# Patient Record
Sex: Male | Born: 1960 | Hispanic: No | Marital: Married | Smoking: Never smoker
Health system: Southern US, Community
[De-identification: ages and names within clinical notes are randomized; demographics above are authoritative.]

## PROBLEM LIST (undated history)

## (undated) DIAGNOSIS — I10 Essential (primary) hypertension: Secondary | ICD-10-CM

## (undated) DIAGNOSIS — N200 Calculus of kidney: Secondary | ICD-10-CM

## (undated) DIAGNOSIS — E785 Hyperlipidemia, unspecified: Secondary | ICD-10-CM

## (undated) DIAGNOSIS — M549 Dorsalgia, unspecified: Secondary | ICD-10-CM

## (undated) DIAGNOSIS — R413 Other amnesia: Secondary | ICD-10-CM

## (undated) HISTORY — DX: Other amnesia: R41.3

## (undated) HISTORY — DX: Dorsalgia, unspecified: M54.9

## (undated) HISTORY — PX: OTHER SURGICAL HISTORY: SHX169

## (undated) HISTORY — DX: Essential (primary) hypertension: I10

---

## 1998-09-03 ENCOUNTER — Ambulatory Visit (HOSPITAL_COMMUNITY): Admission: RE | Admit: 1998-09-03 | Discharge: 1998-09-03 | Payer: Self-pay | Admitting: Family Medicine

## 1998-09-03 ENCOUNTER — Encounter: Payer: Self-pay | Admitting: Family Medicine

## 1998-11-20 ENCOUNTER — Encounter: Admission: RE | Admit: 1998-11-20 | Discharge: 1998-11-20 | Payer: Self-pay | Admitting: Urology

## 1998-11-20 ENCOUNTER — Encounter: Payer: Self-pay | Admitting: Urology

## 1998-12-23 ENCOUNTER — Other Ambulatory Visit: Admission: RE | Admit: 1998-12-23 | Discharge: 1998-12-23 | Payer: Self-pay | Admitting: Urology

## 2006-03-13 ENCOUNTER — Emergency Department (HOSPITAL_COMMUNITY): Admission: EM | Admit: 2006-03-13 | Discharge: 2006-03-13 | Payer: Self-pay | Admitting: Emergency Medicine

## 2006-11-03 ENCOUNTER — Emergency Department (HOSPITAL_COMMUNITY): Admission: EM | Admit: 2006-11-03 | Discharge: 2006-11-03 | Payer: Self-pay | Admitting: Emergency Medicine

## 2007-06-08 ENCOUNTER — Encounter: Payer: Self-pay | Admitting: Gastroenterology

## 2007-12-05 ENCOUNTER — Encounter: Payer: Self-pay | Admitting: Gastroenterology

## 2008-01-22 ENCOUNTER — Encounter: Payer: Self-pay | Admitting: Gastroenterology

## 2008-02-09 ENCOUNTER — Ambulatory Visit: Payer: Self-pay | Admitting: Cardiology

## 2008-02-09 ENCOUNTER — Encounter: Payer: Self-pay | Admitting: Cardiology

## 2008-02-09 DIAGNOSIS — I10 Essential (primary) hypertension: Secondary | ICD-10-CM | POA: Insufficient documentation

## 2008-02-09 DIAGNOSIS — R9431 Abnormal electrocardiogram [ECG] [EKG]: Secondary | ICD-10-CM

## 2008-02-09 DIAGNOSIS — R079 Chest pain, unspecified: Secondary | ICD-10-CM | POA: Insufficient documentation

## 2008-02-09 HISTORY — DX: Abnormal electrocardiogram (ECG) (EKG): R94.31

## 2008-03-08 ENCOUNTER — Ambulatory Visit: Payer: Self-pay

## 2008-03-08 ENCOUNTER — Ambulatory Visit: Payer: Self-pay | Admitting: Cardiology

## 2008-03-08 ENCOUNTER — Encounter: Payer: Self-pay | Admitting: Cardiology

## 2009-05-28 ENCOUNTER — Encounter: Payer: Self-pay | Admitting: Gastroenterology

## 2009-05-30 ENCOUNTER — Encounter (INDEPENDENT_AMBULATORY_CARE_PROVIDER_SITE_OTHER): Payer: Self-pay | Admitting: *Deleted

## 2009-07-02 ENCOUNTER — Ambulatory Visit: Payer: Self-pay | Admitting: Gastroenterology

## 2009-07-02 DIAGNOSIS — R195 Other fecal abnormalities: Secondary | ICD-10-CM | POA: Insufficient documentation

## 2009-07-15 ENCOUNTER — Ambulatory Visit: Payer: Self-pay | Admitting: Gastroenterology

## 2009-07-17 ENCOUNTER — Encounter: Payer: Self-pay | Admitting: Gastroenterology

## 2010-02-17 NOTE — Letter (Signed)
Summary: [facility] Instructions  [facility]  [address] [place], [place] [address]   Phone: [phone]  Fax: [phone]       Joseph Tyler    1960/03/31    MRN: [id number]        Procedure Day /Date:TUESDAY 07/15/2009     Arrival Time:9:30AM     Procedure Time:10:30AM     Location of Procedure:                    X   [facility] (4th Floor)                        PREPARATION FOR COLONOSCOPY WITH MOVIPREP   Starting 5 days prior to your procedure 07/10/2009 do not eat nuts, seeds, popcorn, corn, beans, peas,  salads, or any raw vegetables.  Do not take any fiber supplements (e.g. Metamucil, Citrucel, and Benefiber).  THE DAY BEFORE YOUR PROCEDURE         DATE: 07/14/2009  DAY: MONDAY  1.  Drink clear liquids the entire day-NO SOLID FOOD  2.  Do not drink anything colored red or purple.  Avoid juices with pulp.  No orange juice.  3.  Drink at least 64 oz. (8 glasses) of fluid/clear liquids during the day to prevent dehydration and help the prep work efficiently.  CLEAR LIQUIDS INCLUDE: Water Jello Ice Popsicles Tea (sugar ok, no milk/cream) Powdered fruit flavored drinks Coffee (sugar ok, no milk/cream) Gatorade Juice: apple, white grape, white cranberry  Lemonade Clear bullion, consomm, broth Carbonated beverages (any kind) Strained chicken noodle soup Hard Candy                             4.  In the morning, mix first dose of MoviPrep solution:    Empty 1 Pouch A and 1 Pouch B into the disposable container    Add lukewarm drinking water to the top line of the container. Mix to dissolve    Refrigerate (mixed solution should be used within 24 hrs)  5.  Begin drinking the prep at 5:00 p.m. The MoviPrep container is divided by 4 marks.   Every 15 minutes drink the solution down to the next mark (approximately 8 oz) until the full liter is complete.   6.  Follow completed prep with 16 oz of clear liquid of your choice  (Nothing red or purple).  Continue to drink clear liquids until bedtime.  7.  Before going to bed, mix second dose of MoviPrep solution:    Empty 1 Pouch A and 1 Pouch B into the disposable container    Add lukewarm drinking water to the top line of the container. Mix to dissolve    Refrigerate  THE DAY OF YOUR PROCEDURE      DATE: 07/15/2009 DAY: TUESDAY  Beginning at 5:30a.m. (5 hours before procedure):         1. Every 15 minutes, drink the solution down to the next mark (approx 8 oz) until the full liter is complete.  2. Follow completed prep with 16 oz. of clear liquid of your choice.    3. You may drink clear liquids until8:30AM (2 HOURS BEFORE PROCEDURE).   MEDICATION INSTRUCTIONS  Unless otherwise instructed, you should take regular prescription medications with a small sip of water   as early as possible the morning of your procedure.  OTHER INSTRUCTIONS  You will need a responsible adult at least 50 years of age to accompany you and drive you home.   This person must remain in the waiting room during your procedure.  Wear loose fitting clothing that is easily removed.  Leave jewelry and other valuables at home.  However, you may wish to bring a book to read or  an iPod/MP3 player to listen to music as you wait for your procedure to start.  Remove all body piercing jewelry and leave at home.  Total time from sign-in until discharge is approximately 2-3 hours.  You should go home directly after your procedure and rest.  You can resume normal activities the  day after your procedure.  The day of your procedure you should not:   Drive   Make legal decisions   Operate machinery   Drink alcohol   Return to work  You will receive specific instructions about eating, activities and medications before you leave.    The above instructions have been reviewed and explained to me by   _______________________    I fully understand and can  verbalize these instructions _____________________________ Date _________

## 2010-02-17 NOTE — Letter (Signed)
Summary: New Patient letter  [facility]  [address] [place], [place] [address]   Phone: [phone]  Fax: [phone]       05/30/2009 MRN: [id number]  [facility] [address] [place], [place]  [address]  Dear Joseph Tyler,  Welcome to the Gastroenterology Division at [facility].    You are scheduled to see Dr.  Arlyce Dice on 07-02-09 at 10am on the 3rd floor at [facility], [address]  We ask that you try to arrive at our office 15 minutes prior to your appointment time to allow for check-in.  We would like you to complete the enclosed self-administered evaluation form prior to your visit and bring it with you on the day of your appointment.  We will review it with you.  Also, please bring a complete list of all your medications or, if you prefer, bring the medication bottles and we will list them.  Please bring your insurance card so that we may make a copy of it.  If your insurance requires a referral to see a specialist, please bring your referral form from your primary care physician.  Co-payments are due at the time of your visit and may be paid by cash, check or credit card.     Your office visit will consist of a consult with your physician (includes a physical exam), any laboratory testing he/she may order, scheduling of any necessary diagnostic testing (e.g. x-ray, ultrasound, CT-scan), and scheduling of a procedure (e.g. Endoscopy, Colonoscopy) if required.  Please allow enough time on your schedule to allow for any/all of these possibilities.    If you cannot keep your appointment, please call [phone] to cancel or reschedule prior to your appointment date.  This allows [place] the opportunity to schedule an appointment for another patient in need of care.  If you do not cancel or reschedule by 5 p.m. the business day prior to your appointment date, you will be charged a $50.00 late cancellation/no-show fee.    Thank you for choosing  [facility] for your medical needs.  We appreciate the opportunity to care for you.  Please visit [place] at our website  to learn more about our practice.                     Sincerely,                                                             The Gastroenterology Division

## 2010-02-17 NOTE — Letter (Signed)
Summary: Results Letter  [facility]  [address] [place], [place] [address]   Phone: [phone]  Fax: [phone]        July 17, 2009 MRN: [id number]    [facility] [address] [place], [place]  [address]    Dear Mr. LAMINACK,  Your biopsy results did not show any remarkable findings. I recommend that you undergo followup surveillance colonloscopy in 10 years. Please continue with the recommendations previously discussed.  Should you have any further questions or immediate concers, feel free to contact me.  Sincerely,  Barbette Hair. Arlyce Dice, M.D., [facility]          Sincerely,  Louis Meckel MD  This letter has been electronically signed by your physician.  Appended Document: Results Letter letter mailed 7.6.11

## 2010-02-17 NOTE — Letter (Signed)
Summary: Results Letter  [facility]  [address] [place], [place] [address]   Phone: [phone]  Fax: [phone]        July 02, 2009 MRN: [id number]    [facility] [address] [place], [place]  [address]    Dear Mr. BOUILLON,  It is my pleasure to have treated you recently as a new patient in my office. I appreciate your confidence and the opportunity to participate in your care.  Since I do have a busy inpatient endoscopy schedule and office schedule, my office hours vary weekly. I am, however, available for emergency calls everyday through my office. If I am not available for an urgent office appointment, another one of our gastroenterologist will be able to assist you.  My well-trained staff are prepared to help you at all times. For emergencies after office hours, a physician from our Gastroenterology section is always available through my 24 hour answering service  Once again I welcome you as a new patient and I look forward to a happy and healthy relationship             Sincerely,  Louis Meckel MD  This letter has been electronically signed by your physician.  Appended Document: Results Letter letter mailed

## 2010-02-17 NOTE — Procedures (Signed)
Summary: Colonoscopy  Patient: Joseph Tyler Note: All result statuses are Final unless otherwise noted.  Tests: (1) Colonoscopy (COL)   COL Colonoscopy           DONE     [facility]     [address]     [place], [place]  [address]           COLONOSCOPY PROCEDURE REPORT           PATIENT:  Joseph Tyler, Joseph Tyler  MR#:  [id number]     BIRTHDATE:  23-Dec-1960, 49 yrs. old  GENDER:  male           ENDOSCOPIST:  Barbette Hair. Arlyce Dice, MD     Referred by:           PROCEDURE DATE:  07/15/2009     PROCEDURE:  Colonoscopy with snare polypectomy     ASA CLASS:  Class II     INDICATIONS:  1) heme positive stool           MEDICATIONS:   Fentanyl 50 mcg IV, Versed 4 mg IV           DESCRIPTION OF PROCEDURE:   After the risks benefits and     alternatives of the procedure were thoroughly explained, informed     consent was obtained.  Digital rectal exam was performed and     revealed no abnormalities.   The [id number] [id number] endoscope was     introduced through the anus and advanced to the cecum, which was     identified by both the appendix and ileocecal valve, without     limitations.  The quality of the prep was excellent, using     MoviPrep.  The instrument was then slowly withdrawn as the colon     was fully examined.     <<PROCEDUREIMAGES>>           FINDINGS:  A diminutive polyp was found in the rectum. It was 2 mm     in size. It was found 2 cm from the point of entry. Nonbleeding     sessile polyp Polyp was snared without cautery. Retrieval was     successful (see image21). snare polyp  This was otherwise a normal     examination of the colon (see image2, image3, image4, image7,     image8, image9, image12, image15, image17, image19, and image20).     Retroflexed views in the rectum revealed no abnormalities.    The     time to cecum =  2.0  minutes. The scope was then withdrawn (time     =  11.25  min) from the patient and the procedure completed.        COMPLICATIONS:  None           ENDOSCOPIC IMPRESSION:     1) 2 mm diminutive polyp in the rectum     2) Otherwise normal examination           No definitive source for heme positive stool identified           RECOMMENDATIONS:     1) If the polyp(s) removed today are proven to be adenomatous     (pre-cancerous) polyps, you will need a repeat colonoscopy in 5     years. Otherwise you should continue to follow colorectal cancer     screening guidelines for "routine risk" patients with colonoscopy     in 10 years.     2) followup  hemeoccults in 1 week           REPEAT EXAM:   You will receive a letter from Dr. Arlyce Dice in 1-2     weeks, after reviewing the final pathology, with followup     recommendations.           ______________________________     Barbette Hair Arlyce Dice, MD           CC: Lewie Deman Bellow, MD           n.     Rosalie Doctor:   Barbette Hair. Gavyn Ybarra at 07/15/2009 11:16 AM           Page 2 of 3   [name], [name], [phone]  Note: An exclamation mark (!) indicates a result that was not dispersed into the flowsheet. Document Creation Date: 07/15/2009 11:17 AM _______________________________________________________________________  (1) Order result status: Final Collection or observation date-time: 07/15/2009 11:10 Requested date-time:  Receipt date-time:  Reported date-time:  Referring Physician:   Ordering Physician: Melvia Heaps [id number]) Specimen Source:  Source: Launa Grill Order Number: [id number] Lab site:   Appended Document: Colonoscopy     Procedures Next Due Date:    Colonoscopy: 07/2019

## 2010-02-17 NOTE — Assessment & Plan Note (Signed)
Summary: BLOOD IN STOOLS & FATIGUE/YF   History of Present Illness Visit Type: Initial Consult Primary GI MD: Melvia Heaps MD [facility] Primary Provider: Mila Homer, MD Requesting Provider: Mila Homer, MD Chief Complaint: Patient was referred for hem pos stools but he denies any blood in his stool. He also denies any GI complaints. Only states that he has alot of fatigue.  History of Present Illness:   Mr. Alvira is a 50 year old male referred at the request of Dr. Perrin Maltese for Hemoccult-positive stools.  He has no GI complaints including change in bowel habits, abdominal pain, melena or hematochezia.  He is on no gastric irritants.  Laboratory is  pertinent for a hemoglobin of 12.8 with a normal MCV.  In November, 2009 hemoglobin was 13.9.   GI Review of Systems      Denies abdominal pain, acid reflux, belching, bloating, chest pain, dysphagia with liquids, dysphagia with solids, heartburn, loss of appetite, nausea, vomiting, vomiting blood, weight loss, and  weight gain.      Reports heme positive stool.     Denies anal fissure, black tarry stools, change in bowel habit, constipation, diarrhea, diverticulosis, fecal incontinence, hemorrhoids, irritable bowel syndrome, jaundice, light color stool, liver problems, rectal bleeding, and  rectal pain.    Current Medications (verified): 1)  Lisinopril 20 Mg Tabs (Lisinopril) .... One By Mouth Daily 2)  Vitamin D 2000 Unit Tabs (Cholecalciferol) .... Take Two By Mouth Once Daily 3)  Multivitamins  Tabs (Multiple Vitamin) .... Take One By Mouth Once Daily  Allergies (verified): No Known Drug Allergies  Past History:  Past Medical History: Reviewed history from 02/09/2008 and no changes required. HTN (off and on since 2002)  Past Surgical History: Reviewed history from 02/09/2008 and no changes required. None  Social History: The patient has never smoked. He doesn't drink alcohol. He is married. He has 4 young children.  He works for a [employer]. He has 2 jobs. Daily Caffeine Use 4 per day  Review of Systems       The patient complains of back pain, blood in urine, change in vision, headaches-new, and muscle pains/cramps.  The patient denies allergy/sinus, anemia, anxiety-new, arthritis/joint pain, breast changes/lumps, confusion, cough, coughing up blood, depression-new, fainting, fatigue, fever, hearing problems, heart murmur, heart rhythm changes, itching, menstrual pain, night sweats, nosebleeds, pregnancy symptoms, shortness of breath, skin rash, sleeping problems, sore throat, swelling of feet/legs, swollen lymph glands, thirst - excessive , urination - excessive , urination changes/pain, urine leakage, vision changes, and voice change.         All other systems were reviewed and were negative   Vital Signs:  Patient profile:   50 year old male Height:      67 inches Weight:      149.4 pounds BMI:     23.48 Pulse rate:   60 / minute Pulse rhythm:   132regular BP sitting:   132 / 76  (right arm) Cuff size:   regular  Vitals Entered By: Harlow Mares CMA (AAMA) (July 02, 2009 10:14 AM)  Physical Exam  Additional Exam:  And physical exam he is a healthy-appearing male  skin: anicteric HEENT: normocephalic; PEERLA; no nasal or pharyngeal abnormalities neck: supple nodes: no cervical lymphadenopathy chest: clear to ausculatation and percussion heart: no murmurs, gallops, or rubs abd: soft, nontender; BS normoactive; no abdominal masses, tenderness, organomegaly rectal: deferred ext: no cynanosis, clubbing, edema skeletal: no deformities neuro: oriented x 3; no focal abnormalities  Impression & Recommendations:  Problem # 1:  NONSPECIFIC ABNORMAL FINDING IN STOOL CONTENTS (ICD-792.1) Plan colonoscopy.  If negative I will obtain followup Hemoccults.  Risks, alternatives, and complications of the procedure, including bleeding, perforation, and possible need for surgery,  were explained to the patient.  Patient's questions were answered.  Orders: Colonoscopy (Colon)  Patient Instructions: 1)  Copy sent to : Mila Homer, MD 2)  Your colonoscopy is scheduled for 07/15/2009 at 10:30 am on the 4th floor 3)  Your MoviPrep is being sent to your pharmacy today 4)  The medication list was reviewed and reconciled.  All changed / newly prescribed medications were explained.  A complete medication list was provided to the patient / caregiver. 5)  Colonoscopy and Flexible Sigmoidoscopy brochure given.  6)  Conscious Sedation brochure given.  Prescriptions: MOVIPREP 100 GM  SOLR (PEG-KCL-NACL-NASULF-NA ASC-C) As per prep instructions.  #1 x 0   Entered by:   Merri Ray CMA (AAMA)   Authorized by:   Louis Meckel MD   Signed by:   Merri Ray CMA (AAMA) on 07/02/2009   Method used:   Electronically to        [id number] (retail)       [address]       [place], [place]  [address]       Ph: [phone] or [phone]       Fax: [phone]   RxID:   [id number]

## 2010-04-24 ENCOUNTER — Emergency Department (HOSPITAL_COMMUNITY)
Admission: EM | Admit: 2010-04-24 | Discharge: 2010-04-24 | Disposition: A | Discharge status: Home / Self Care | Payer: No Typology Code available for payment source | Attending: Emergency Medicine | Admitting: Emergency Medicine

## 2010-04-24 ENCOUNTER — Emergency Department (HOSPITAL_COMMUNITY): Payer: No Typology Code available for payment source

## 2010-04-24 DIAGNOSIS — S139XXA Sprain of joints and ligaments of unspecified parts of neck, initial encounter: Secondary | ICD-10-CM | POA: Insufficient documentation

## 2010-04-24 DIAGNOSIS — Y92410 Unspecified street and highway as the place of occurrence of the external cause: Secondary | ICD-10-CM | POA: Insufficient documentation

## 2010-04-24 DIAGNOSIS — S8000XA Contusion of unspecified knee, initial encounter: Secondary | ICD-10-CM | POA: Insufficient documentation

## 2010-04-24 DIAGNOSIS — I10 Essential (primary) hypertension: Secondary | ICD-10-CM | POA: Insufficient documentation

## 2010-04-24 DIAGNOSIS — S335XXA Sprain of ligaments of lumbar spine, initial encounter: Secondary | ICD-10-CM | POA: Insufficient documentation

## 2010-07-13 ENCOUNTER — Encounter: Payer: Self-pay | Admitting: Cardiology

## 2010-08-12 ENCOUNTER — Other Ambulatory Visit: Payer: Self-pay | Admitting: Specialist

## 2010-08-12 DIAGNOSIS — M545 Low back pain, unspecified: Secondary | ICD-10-CM

## 2010-08-12 DIAGNOSIS — M542 Cervicalgia: Secondary | ICD-10-CM

## 2010-08-17 ENCOUNTER — Other Ambulatory Visit: Payer: No Typology Code available for payment source

## 2010-08-26 ENCOUNTER — Ambulatory Visit
Admission: RE | Admit: 2010-08-26 | Discharge: 2010-08-26 | Disposition: A | Discharge status: Home / Self Care | Payer: Managed Care, Other (non HMO) | Source: Ambulatory Visit | Attending: Specialist | Admitting: Specialist

## 2010-08-26 DIAGNOSIS — M542 Cervicalgia: Secondary | ICD-10-CM

## 2010-08-26 DIAGNOSIS — M545 Low back pain, unspecified: Secondary | ICD-10-CM

## 2010-10-28 LAB — CBC
Hemoglobin: 13
MCHC: 34.2
RBC: 4.64
WBC: 8.7

## 2010-10-28 LAB — DIFFERENTIAL
Basophils Relative: 0
Eosinophils Absolute: 0.2
Eosinophils Relative: 2
Lymphs Abs: 1.7
Monocytes Relative: 7
Neutrophils Relative %: 70

## 2010-10-28 LAB — COMPREHENSIVE METABOLIC PANEL
ALT: 15
AST: 18
Alkaline Phosphatase: 100
CO2: 23
Calcium: 8.8
GFR calc Af Amer: >60
GFR calc non Af Amer: >60
Glucose, Bld: 191 — ABNORMAL HIGH
Potassium: 3.2 — ABNORMAL LOW
Sodium: 140

## 2010-10-28 LAB — URINALYSIS, ROUTINE W REFLEX MICROSCOPIC
Bilirubin Urine: NEGATIVE
Glucose, UA: NEGATIVE
Ketones, ur: 15 — AB
Protein, ur: 30 — AB
pH: 6.5

## 2010-10-28 LAB — LIPASE, BLOOD: Lipase: 17

## 2010-12-28 ENCOUNTER — Ambulatory Visit: Payer: Self-pay | Attending: Specialist | Admitting: Physical Therapy

## 2010-12-28 DIAGNOSIS — M2569 Stiffness of other specified joint, not elsewhere classified: Secondary | ICD-10-CM | POA: Insufficient documentation

## 2010-12-28 DIAGNOSIS — M545 Low back pain, unspecified: Secondary | ICD-10-CM | POA: Insufficient documentation

## 2010-12-28 DIAGNOSIS — IMO0001 Reserved for inherently not codable concepts without codable children: Secondary | ICD-10-CM | POA: Insufficient documentation

## 2010-12-28 DIAGNOSIS — M25569 Pain in unspecified knee: Secondary | ICD-10-CM | POA: Insufficient documentation

## 2011-01-01 ENCOUNTER — Ambulatory Visit: Payer: Self-pay

## 2011-01-08 ENCOUNTER — Ambulatory Visit: Payer: Self-pay | Admitting: Physical Therapy

## 2011-01-13 ENCOUNTER — Ambulatory Visit: Payer: Self-pay

## 2011-01-27 ENCOUNTER — Ambulatory Visit: Payer: Managed Care, Other (non HMO) | Attending: Specialist | Admitting: Physical Therapy

## 2011-01-27 DIAGNOSIS — M2569 Stiffness of other specified joint, not elsewhere classified: Secondary | ICD-10-CM | POA: Insufficient documentation

## 2011-01-27 DIAGNOSIS — M545 Low back pain, unspecified: Secondary | ICD-10-CM | POA: Insufficient documentation

## 2011-01-27 DIAGNOSIS — IMO0001 Reserved for inherently not codable concepts without codable children: Secondary | ICD-10-CM | POA: Insufficient documentation

## 2011-01-27 DIAGNOSIS — M25569 Pain in unspecified knee: Secondary | ICD-10-CM | POA: Insufficient documentation

## 2011-02-02 ENCOUNTER — Encounter: Payer: Managed Care, Other (non HMO) | Admitting: Physical Therapy

## 2011-02-04 ENCOUNTER — Ambulatory Visit: Payer: Managed Care, Other (non HMO) | Admitting: Physical Therapy

## 2011-02-09 ENCOUNTER — Encounter: Payer: Managed Care, Other (non HMO) | Admitting: Physical Therapy

## 2011-02-11 ENCOUNTER — Encounter: Payer: Managed Care, Other (non HMO) | Admitting: Physical Therapy

## 2011-02-17 ENCOUNTER — Ambulatory Visit: Payer: Managed Care, Other (non HMO) | Admitting: Physical Therapy

## 2011-02-23 ENCOUNTER — Ambulatory Visit: Payer: Self-pay | Attending: Specialist | Admitting: Physical Therapy

## 2011-02-23 DIAGNOSIS — IMO0001 Reserved for inherently not codable concepts without codable children: Secondary | ICD-10-CM | POA: Insufficient documentation

## 2011-02-23 DIAGNOSIS — M545 Low back pain, unspecified: Secondary | ICD-10-CM | POA: Insufficient documentation

## 2011-02-23 DIAGNOSIS — M25569 Pain in unspecified knee: Secondary | ICD-10-CM | POA: Insufficient documentation

## 2011-02-23 DIAGNOSIS — M2569 Stiffness of other specified joint, not elsewhere classified: Secondary | ICD-10-CM | POA: Insufficient documentation

## 2011-02-24 ENCOUNTER — Ambulatory Visit: Payer: Self-pay

## 2011-03-01 ENCOUNTER — Encounter: Payer: Managed Care, Other (non HMO) | Admitting: Physical Therapy

## 2011-03-05 ENCOUNTER — Ambulatory Visit: Payer: Self-pay | Admitting: Physical Therapy

## 2011-03-08 ENCOUNTER — Ambulatory Visit: Payer: Self-pay

## 2011-03-10 ENCOUNTER — Ambulatory Visit: Payer: Self-pay

## 2011-03-25 ENCOUNTER — Other Ambulatory Visit: Payer: Self-pay

## 2011-03-25 NOTE — Telephone Encounter (Signed)
Pt is screaming regarding his medication refills.  He is out of his blood pressure medicines and wants them today.

## 2011-03-26 ENCOUNTER — Other Ambulatory Visit: Payer: Self-pay | Admitting: Family Medicine

## 2011-03-26 MED ORDER — LISINOPRIL 10 MG PO TABS: 10.0000 mg | ORAL_TABLET | Freq: Every day | ORAL | Status: DC

## 2011-03-26 NOTE — Telephone Encounter (Signed)
Spoke with patient and notified 15 day rx sent in today.  He will recheck next Tuesday with Dr. Perrin Maltese.

## 2011-04-09 ENCOUNTER — Ambulatory Visit (INDEPENDENT_AMBULATORY_CARE_PROVIDER_SITE_OTHER): Payer: Managed Care, Other (non HMO) | Admitting: Family Medicine

## 2011-04-09 VITALS — BP 135/89 | HR 70 | Temp 98.5°F | Resp 16 | Ht 67.0 in | Wt 149.0 lb

## 2011-04-09 DIAGNOSIS — K589 Irritable bowel syndrome without diarrhea: Secondary | ICD-10-CM

## 2011-04-09 DIAGNOSIS — I10 Essential (primary) hypertension: Secondary | ICD-10-CM

## 2011-04-09 MED ORDER — ESOMEPRAZOLE MAGNESIUM 40 MG PO CPDR: 40.0000 mg | DELAYED_RELEASE_CAPSULE | Freq: Every day | ORAL | Status: AC

## 2011-04-09 MED ORDER — LISINOPRIL 10 MG PO TABS: 10.0000 mg | ORAL_TABLET | Freq: Every day | ORAL | Status: DC

## 2011-04-09 NOTE — Patient Instructions (Signed)

## 2011-04-09 NOTE — Progress Notes (Signed)
51 yo [place] man with a recurrence of abdominal pains which began yesterday.  Has responded to Nexium in the past.  The discomfort is crampy and assoc with some lower chest pressure.  Also patient needs refill on his lisinopril  O:  HEENT:  Unremarkable except small bleeding area upper left tooth #10 Neck: supple no adenoparhty Chest:  Clear Heart: reg, no murmur or gallop, loud S2 Abdomen:  Mildly tender diffusely with deep palpation, no masses, no rebound or guarding, no HSM Ext: good pulses, FROM, no edema Skin warm and dry.  Assessment:  Stable BP Recurrence of IBS  P:  Nexium x 7 days Refill Lisinopril 10 mg qd

## 2011-04-29 ENCOUNTER — Encounter: Payer: Self-pay | Admitting: Family Medicine

## 2011-04-29 ENCOUNTER — Ambulatory Visit (INDEPENDENT_AMBULATORY_CARE_PROVIDER_SITE_OTHER): Payer: Managed Care, Other (non HMO) | Admitting: Internal Medicine

## 2011-04-29 VITALS — BP 130/86 | HR 63 | Temp 98.2°F | Resp 16 | Ht 67.0 in | Wt 142.0 lb

## 2011-04-29 DIAGNOSIS — K120 Recurrent oral aphthae: Secondary | ICD-10-CM

## 2011-04-29 DIAGNOSIS — K047 Periapical abscess without sinus: Secondary | ICD-10-CM

## 2011-04-29 DIAGNOSIS — L98499 Non-pressure chronic ulcer of skin of other sites with unspecified severity: Secondary | ICD-10-CM

## 2011-04-29 DIAGNOSIS — K122 Cellulitis and abscess of mouth: Secondary | ICD-10-CM

## 2011-04-29 MED ORDER — AMOXICILLIN 500 MG PO CAPS: 1000.0000 mg | ORAL_CAPSULE | Freq: Two times a day (BID) | ORAL | Status: AC

## 2011-04-29 NOTE — Patient Instructions (Signed)
Trench Mouth Trench mouth is a sudden, painful sore (ulceration) of the gums that is caused by bacteria. Trench mouth is a painful form of gingivitis. Gingivitis means redness and soreness of the gums. The term "trench mouth" comes from World War I. The condition was common when the disorder appeared among soldiers in the trenches. The mouth normally contains a balance of all the germs growing in it. When the balance is upset and too many germs start growing, it can cause painful ulcers on the gums. It is an uncommon disorder which usually affects young adults. CAUSES   Poor nutrition.   Poor oral hygiene.   Smoking.   Emotional distress.  SYMPTOMS   Painful gums which are red, swollen, and covered with a grayish film. There may be destruction of tissue between the teeth due to the ulcerations.   Gum bleeding with even minor brushing or irritation.   Ulcerations of the gums along with a bad taste in the mouth and bad breath.   Fever and swollen glands in the neck.  DIAGNOSIS  Your caregiver can usually make this diagnosis by a physical exam. Sometimes, X-rays and cultures may be used to help with the diagnosis. TREATMENT  Treatment is aimed at relief of symptoms and getting rid of the infection. Antibiotic medicine may be prescribed. HOME CARE INSTRUCTIONS  Good oral hygiene is necessary. Thorough toothbrushing and flossing must be performed often. This should be done after meals and at bedtime. Your caregiver may be able to help by suggesting a soothing rinse or the use of a local pain medicine, such as viscous lidocaine, before brushing.   Warm salt water rinses made up of 1 tsp of salt in 2 cups of warm water may be helpful. If the rinse is painful, add more water.   Only take over-the-counter or prescription medicines for pain, discomfort, or fever as directed by your caregiver.   Take your antibiotics as directed. Finish them even if you start to feel better.   Follow your  dentist's instructions for teeth cleaning during this infection. Follow up with all your caregivers as directed.   Avoid smoking, alcohol, and irritating foods. You will know immediately which foods irritate your mouth. Typically, these will be spicy, sour, or citrus foods.  SEEK IMMEDIATE MEDICAL CARE IF:   You develop pain or swelling in your face.   You have a fever.   You are unable to take fluids or eat food comfortably.   Your medicines do not seem to be working and you feel you are getting worse.   Your medicines are not relieving your pain.   You develop a stiff neck or a severe headache, which is unrelieved with medicines.  MAKE SURE YOU:  Understand these instructions.   Will watch your condition.   Will get help right away if you are not doing well or get worse.  Document Released: 04/09/2004 Document Revised: 12/24/2010 Document Reviewed: 08/13/2010 [facility] Patient Information 2012 [place], [place].

## 2011-04-29 NOTE — Progress Notes (Signed)
  Subjective:    Patient ID: Joseph Tyler, male    DOB: 08/30/1960, 51 y.o.   MRN: [id number]  HPI Has gum and tongue pain, seeing dentist for infected tooth. Has new dental abcess . Review of Systems No tobacco use   Objective:   Physical Exam Tender tooth. Ulcer tongue right side small      Assessment & Plan:  Amoxil 500mg  tid Dukes magic mouthwash

## 2011-05-24 ENCOUNTER — Telehealth: Payer: Self-pay

## 2011-05-24 DIAGNOSIS — K121 Other forms of stomatitis: Secondary | ICD-10-CM

## 2011-05-24 NOTE — Telephone Encounter (Signed)
Ok for referral to ENT.  Please refer to ENT of patient's choice.

## 2011-05-24 NOTE — Telephone Encounter (Signed)
Can we refer for mouth ulcer?

## 2011-05-24 NOTE — Telephone Encounter (Signed)
Patient would like Dr. Perrin Maltese to refer him to ENT. Is still having problems that he was already seen for.

## 2011-05-24 NOTE — Telephone Encounter (Signed)
Pt does not have any particular ENT he wants to see - just whoever can see him soon. He may RTC if it takes too long to get in to see specialist. Told pt we will be happy to see him for f/up

## 2011-06-08 ENCOUNTER — Ambulatory Visit (INDEPENDENT_AMBULATORY_CARE_PROVIDER_SITE_OTHER): Payer: Managed Care, Other (non HMO) | Admitting: Internal Medicine

## 2011-06-08 VITALS — BP 109/71 | HR 65 | Temp 97.9°F | Resp 16 | Ht 68.0 in | Wt 142.6 lb

## 2011-06-08 DIAGNOSIS — R634 Abnormal weight loss: Secondary | ICD-10-CM

## 2011-06-08 DIAGNOSIS — Z7189 Other specified counseling: Secondary | ICD-10-CM

## 2011-06-08 DIAGNOSIS — K146 Glossodynia: Secondary | ICD-10-CM | POA: Insufficient documentation

## 2011-06-08 DIAGNOSIS — I10 Essential (primary) hypertension: Secondary | ICD-10-CM

## 2011-06-08 DIAGNOSIS — R5383 Other fatigue: Secondary | ICD-10-CM

## 2011-06-08 LAB — POCT CBC
Lymph, poc: 1.8 (ref 0.6–3.4)
MCH, POC: 27.3 pg (ref 27–31.2)
MCHC: 32.3 g/dL (ref 31.8–35.4)
MID (cbc): 0.7 (ref 0–0.9)
MPV: 8.6 fL (ref 0–99.8)
POC Granulocyte: 4.4 (ref 2–6.9)
POC MID %: 10 %M (ref 0–12)
Platelet Count, POC: 246 10*3/uL (ref 142–424)
RDW, POC: 15.3 %
WBC: 6.9 10*3/uL (ref 4.6–10.2)

## 2011-06-08 MED ORDER — LISINOPRIL 10 MG PO TABS: 10.0000 mg | ORAL_TABLET | Freq: Every day | ORAL | Status: DC

## 2011-06-08 MED ORDER — HYDROCODONE-ACETAMINOPHEN 7.5-500 MG/15ML PO SOLN: 5.0000 mL | Freq: Four times a day (QID) | ORAL | Status: AC | PRN

## 2011-06-08 NOTE — Progress Notes (Signed)
  Subjective:    Patient ID: Joseph Tyler, male    DOB: [birth year], 51 y.o.   MRN: [id number]  HPI Has progressive painful tongue over 2 months. Antibiotics and gargles no help. Scheduled to see ENT Dr. Emeline Darling Needs HTN f/up Has fatigue from painful tongue, talking   Review of Systems     Objective:   Physical Exam Tongue R posterior red, angry, ulcerated BP controlled Heart ok  labs     Assessment & Plan:  Lortab elixir for pain RF lisinopril

## 2011-06-09 ENCOUNTER — Other Ambulatory Visit: Payer: Self-pay | Admitting: Otolaryngology

## 2011-06-09 LAB — COMPREHENSIVE METABOLIC PANEL
ALT: <8 U/L (ref 0–53)
AST: 10 U/L (ref 0–37)
Alkaline Phosphatase: 93 U/L (ref 39–117)
Creat: 0.97 mg/dL (ref 0.50–1.35)
Sodium: 137 mEq/L (ref 135–145)
Total Bilirubin: 0.5 mg/dL (ref 0.3–1.2)

## 2011-06-09 LAB — TSH: TSH: 1.357 u[IU]/mL (ref 0.350–4.500)

## 2011-06-11 ENCOUNTER — Encounter: Payer: Self-pay | Admitting: Family Medicine

## 2011-08-02 ENCOUNTER — Ambulatory Visit: Payer: Managed Care, Other (non HMO) | Admitting: Internal Medicine

## 2011-08-16 ENCOUNTER — Ambulatory Visit (INDEPENDENT_AMBULATORY_CARE_PROVIDER_SITE_OTHER): Payer: Managed Care, Other (non HMO) | Admitting: Internal Medicine

## 2011-08-16 ENCOUNTER — Encounter: Payer: Self-pay | Admitting: Internal Medicine

## 2011-08-16 VITALS — BP 116/80 | HR 58 | Temp 98.0°F | Resp 16 | Ht 67.5 in | Wt 140.2 lb

## 2011-08-16 DIAGNOSIS — R5381 Other malaise: Secondary | ICD-10-CM

## 2011-08-16 DIAGNOSIS — Z79899 Other long term (current) drug therapy: Secondary | ICD-10-CM

## 2011-08-16 DIAGNOSIS — N529 Male erectile dysfunction, unspecified: Secondary | ICD-10-CM

## 2011-08-16 DIAGNOSIS — I10 Essential (primary) hypertension: Secondary | ICD-10-CM

## 2011-08-16 DIAGNOSIS — R5383 Other fatigue: Secondary | ICD-10-CM

## 2011-08-16 LAB — BASIC METABOLIC PANEL
BUN: 10 mg/dL (ref 6–23)
CO2: 24 mEq/L (ref 19–32)
Chloride: 108 mEq/L (ref 96–112)
Potassium: 4.3 mEq/L (ref 3.5–5.3)

## 2011-08-16 NOTE — Patient Instructions (Signed)
Erectile Dysfunction Erectile dysfunction (ED) is the inability to get a good enough erection to have sexual intercourse. ED may involve:  Inability to get an erection.   Lack of enough hardness to allow penetration.   Loss of the erection before sex is finished.   Premature ejaculation.   Any combination of these problems if they occur more than 25% of the time.  CAUSES  Certain drugs, such as:   Pain relievers.   Antihistamines.   Antidepressants.   Blood pressure medicines.   Water pills.   Ulcer medicines.   Muscle relaxants.   Illegal drugs.   Excessive drinking.   Psychological causes, such as:   Anxiety.   Depression.   Sadness.   Exhaustion.   Performance fear.   Stress.   Physical causes, such as:   Artery problems. This may include diabetes, smoking, liver disease, or atherosclerosis.   High blood pressure.   Hormonal problems, such as low testosterone.   Obesity.   Nerve problems. This may include back or pelvic injuries, diabetes, multiple sclerosis, Parkinson's disease, or some surgeries.  SYMPTOMS  Inability to get an erection.   Lack of enough hardness to allow penetration.   Loss of the erection before sex is finished.   Premature ejaculation.   Normal erections at some times, but with frequent unsatisfactory episodes.   Orgasms that are not satisfactory in sensation or frequency.   Low sexual satisfaction in either partner because of erection problems.   A curved penis occurring with erection. The curve may cause pain or may be too curved to allow for intercourse.   Never having nighttime erections.  DIAGNOSIS Your caregiver can often diagnose this condition by:  Performing a physical exam to find other diseases or specific problems with the penis.   Asking you detailed questions about the problem.   Performing blood tests to check for diabetes or to measure hormone levels.   Performing urine tests to find other  underlying health conditions.   Performing an ultrasound to check for scarring.   Performing a test to check blood flow to the penis.   Doing a sleep study at home to measure nighttime erections.  TREATMENT   You may be prescribed medicines by mouth.   You may be given medicine injections into the penis.   You may be prescribed a vacuum pump with a ring.   Penile implant surgery may be performed. You may receive:   An inflatable implant.   A semi-rigid implant.   Blood vessel surgery may be performed.  HOME CARE INSTRUCTIONS  Take all medicine as directed by your caregiver. Do not take any other medicines without talking to your caregiver first.   Follow your caregiver's directions for specific treatments as prescribed.   Follow up with your caregiver as directed.  Document Released: 01/02/2000 Document Revised: 12/24/2010 Document Reviewed: 04/26/2010 [employer] Patient Information 2012 [employer]. 

## 2011-08-16 NOTE — Progress Notes (Signed)
  Subjective:    Patient ID: Joseph Tyler, male    DOB: [birth year], 51 y.o.   MRN: [id number]  HPI HTN controlled. Meds tolerated. Has knee pain, has orthoped. Surgery considered. Tongue issue cured C/o memory slower, fatigue, ed  Review of Systems     Objective:   Physical Exam  Constitutional: He is oriented to person, place, and time. He appears well-developed and well-nourished.  HENT:  Mouth/Throat: Oropharynx is clear and moist.  Eyes: EOM are normal.  Cardiovascular: Normal rate and regular rhythm.   Pulmonary/Chest: Effort normal.  Musculoskeletal: He exhibits tenderness.  Neurological: He is alert and oriented to person, place, and time.  Psychiatric: He has a normal mood and affect.   Knees have full rom, good strength Pain with squatting.       Assessment & Plan:  Knee pain rehab given ED info given

## 2011-08-17 LAB — TESTOSTERONE: Testosterone: 658.93 ng/dL (ref 300–890)

## 2011-08-19 ENCOUNTER — Encounter: Payer: Self-pay | Admitting: Radiology

## 2011-11-15 ENCOUNTER — Encounter: Payer: Self-pay | Admitting: Internal Medicine

## 2011-11-15 ENCOUNTER — Ambulatory Visit (INDEPENDENT_AMBULATORY_CARE_PROVIDER_SITE_OTHER): Payer: Managed Care, Other (non HMO) | Admitting: Internal Medicine

## 2011-11-15 ENCOUNTER — Other Ambulatory Visit: Payer: Self-pay | Admitting: Internal Medicine

## 2011-11-15 VITALS — BP 120/78 | HR 59 | Temp 97.6°F | Resp 16 | Ht 67.0 in | Wt 142.2 lb

## 2011-11-15 DIAGNOSIS — Z0000 Encounter for general adult medical examination without abnormal findings: Secondary | ICD-10-CM

## 2011-11-15 DIAGNOSIS — R5383 Other fatigue: Secondary | ICD-10-CM

## 2011-11-15 DIAGNOSIS — R9431 Abnormal electrocardiogram [ECG] [EKG]: Secondary | ICD-10-CM

## 2011-11-15 DIAGNOSIS — N529 Male erectile dysfunction, unspecified: Secondary | ICD-10-CM

## 2011-11-15 DIAGNOSIS — I10 Essential (primary) hypertension: Secondary | ICD-10-CM

## 2011-11-15 DIAGNOSIS — E538 Deficiency of other specified B group vitamins: Secondary | ICD-10-CM

## 2011-11-15 DIAGNOSIS — IMO0001 Reserved for inherently not codable concepts without codable children: Secondary | ICD-10-CM

## 2011-11-15 LAB — LIPID PANEL
HDL: 45 mg/dL (ref >39)
Triglycerides: 55 mg/dL (ref <150)

## 2011-11-15 LAB — CBC WITH DIFFERENTIAL/PLATELET
Basophils Relative: 1 % (ref 0–1)
HCT: 40.5 % (ref 39.0–52.0)
Hemoglobin: 13.6 g/dL (ref 13.0–17.0)
Lymphocytes Relative: 21 % (ref 12–46)
MCHC: 33.6 g/dL (ref 30.0–36.0)
MCV: 82.2 fL (ref 78.0–100.0)
Monocytes Absolute: 0.5 10*3/uL (ref 0.1–1.0)
Monocytes Relative: 9 % (ref 3–12)
Neutro Abs: 3.4 10*3/uL (ref 1.7–7.7)

## 2011-11-15 LAB — COMPREHENSIVE METABOLIC PANEL
Albumin: 3.9 g/dL (ref 3.5–5.2)
BUN: 8 mg/dL (ref 6–23)
Calcium: 9.3 mg/dL (ref 8.4–10.5)
Chloride: 107 mEq/L (ref 96–112)
Glucose, Bld: 79 mg/dL (ref 70–99)
Potassium: 4.7 mEq/L (ref 3.5–5.3)

## 2011-11-15 LAB — POCT URINALYSIS DIPSTICK
Bilirubin, UA: NEGATIVE
Glucose, UA: NEGATIVE
Leukocytes, UA: NEGATIVE
Nitrite, UA: NEGATIVE
Urobilinogen, UA: 0.2
pH, UA: 7.5

## 2011-11-15 LAB — POCT UA - MICROSCOPIC ONLY
Bacteria, U Microscopic: NEGATIVE
Casts, Ur, LPF, POC: NEGATIVE
Crystals, Ur, HPF, POC: NEGATIVE
Yeast, UA: NEGATIVE

## 2011-11-15 MED ORDER — SILDENAFIL CITRATE 50 MG PO TABS: 50.0000 mg | ORAL_TABLET | Freq: Every day | ORAL | Status: DC | PRN

## 2011-11-15 MED ORDER — CYANOCOBALAMIN 1000 MCG/ML IJ SOLN
1000.0000 ug | Freq: Once | INTRAMUSCULAR | Status: AC
  Administered 2011-11-15: 1000 ug via INTRAMUSCULAR

## 2011-11-15 NOTE — Patient Instructions (Addendum)
DASH Diet The DASH diet stands for "Dietary Approaches to Stop Hypertension." It is a healthy eating plan that has been shown to reduce high blood pressure (hypertension) in as little as 14 days, while also possibly providing other significant health benefits. These other health benefits include reducing the risk of breast cancer after menopause and reducing the risk of type 2 diabetes, heart disease, colon cancer, and stroke. Health benefits also include weight loss and slowing kidney failure in patients with chronic kidney disease.  DIET GUIDELINES  Limit salt (sodium). Your diet should contain less than 1500 mg of sodium daily.  Limit refined or processed carbohydrates. Your diet should include mostly whole grains. Desserts and added sugars should be used sparingly.  Include small amounts of heart-healthy fats. These types of fats include nuts, oils, and tub margarine. Limit saturated and trans fats. These fats have been shown to be harmful in the body. CHOOSING FOODS  The following food groups are based on a 2000 calorie diet. See your Registered Dietitian for individual calorie needs. Grains and Grain Products (6 to 8 servings daily)  Eat More Often: Whole-wheat bread, brown rice, whole-grain or wheat pasta, quinoa, popcorn without added fat or salt (air popped).  Eat Less Often: White bread, white pasta, white rice, cornbread. Vegetables (4 to 5 servings daily)  Eat More Often: Fresh, frozen, and canned vegetables. Vegetables may be raw, steamed, roasted, or grilled with a minimal amount of fat.  Eat Less Often/Avoid: Creamed or fried vegetables. Vegetables in a cheese sauce. Fruit (4 to 5 servings daily)  Eat More Often: All fresh, canned (in natural juice), or frozen fruits. Dried fruits without added sugar. One hundred percent fruit juice ( cup [237 mL] daily).  Eat Less Often: Dried fruits with added sugar. Canned fruit in light or heavy syrup. [employer], Fish, and Poultry (2  servings or less daily. One serving is 3 to 4 oz [85-114 g]).  Eat More Often: Ninety percent or leaner ground beef, tenderloin, sirloin. Round cuts of beef, chicken breast, [place] breast. All fish. Grill, bake, or broil your meat. Nothing should be fried.  Eat Less Often/Avoid: Fatty cuts of meat, [place], or chicken leg, thigh, or wing. Fried cuts of meat or fish. Dairy (2 to 3 servings)  Eat More Often: Low-fat or fat-free milk, low-fat plain or light yogurt, reduced-fat or part-skim cheese.  Eat Less Often/Avoid: Milk (whole, 2%).Whole milk yogurt. Full-fat cheeses. Nuts, Seeds, and Legumes (4 to 5 servings per week)  Eat More Often: All without added salt.  Eat Less Often/Avoid: Salted nuts and seeds, canned beans with added salt. Fats and Sweets (limited)  Eat More Often: Vegetable oils, tub margarines without trans fats, sugar-free gelatin. Mayonnaise and salad dressings.  Eat Less Often/Avoid: Coconut oils, palm oils, butter, stick margarine, cream, half and half, cookies, candy, pie. FOR MORE INFORMATION The Dash Diet Eating Plan: www.dashdiet.org Document Released: 12/24/2010 Document Revised: 03/29/2011 Document Reviewed: 12/24/2010 [facility] Patient Information 2013 [place], [place]. Vitamin B12 Deficiency Not having enough vitamin B12 is called a deficiency. Vitamin B12 is an important vitamin. Your body needs vitamin B12 to:   Make red blood cells.  Make DNA. This is the genetic material inside all of your cells.  Help your nerves work properly so they can carry messages from your brain to your body. CAUSES  Not eating enough foods that contain vitamin B12.  Not having enough stomach acid and digestive juices. The body needs these to absorb vitamin B12 from  the food you eat.  Having certain digestive system diseases that make it hard to absorb vitamin B12. These diseases include Crohn's disease, chronic pancreatitis, and cystic fibrosis.  Having pernicious anemia,  which is a condition where the body has too few red blood cells. People with this condition do not make enough of a protein called "intrinsic factor," which is needed to absorb vitamin B12.  Having a surgery in which part of the stomach or small intestine is removed.  Taking certain medicines that make it hard for the body to absorb vitamin B12. These medicines include:  Heartburn medicine (antacids and proton pump inhibitors).  A certain antibiotic medicine called neomycin, which fights infection.  Some medicines used to treat diabetes, tuberculosis, gout, and high cholesterol. RISK FACTORS Risk factors are things that make you more likely to develop a vitamin B12 deficiency. They include:  Being older than 50.  Being a vegetarian.  Being pregnant and a vegetarian or having a poor diet.  Taking certain drugs.  Being an alcoholic. SYMPTOMS You may have a vitamin B12 deficiency with no symptoms. However, a vitamin B12 deficiency can cause health problems like anemia and nerve damage. These health problems can lead to many possible symptoms, including:  Weakness.  Fatigue.  Loss of appetite.  Weight loss.  Numbness or tingling in your hands and feet.  Redness and burning of the tongue.  Confusion or memory problems.  Depression.  Dizziness.  Sensory problems, such as loss of taste, color blindness, and ringing in the ears.  Diarrhea or constipation.  Trouble walking. DIAGNOSIS Various types of tests can be given to help find the cause of your vitamin B12 deficiency. These tests include:  A complete blood count (CBC). This test gives your caregiver an overall picture of what makes up your blood.  A blood test to measure your B12 level.  A blood test to measure intrinsic factor.  An endoscopy. This procedure uses a thin tube with a camera on the end to look into your stomach or intestines. TREATMENT Treatment for vitamin B12 deficiency depends on what is  causing it. Common options include:  Changing your eating and drinking habits, such as:  Eating more foods that contain vitamin B12.  Not drinking as much alcohol or any alcohol.  Taking vitamin B12 supplements. Your caregiver will tell you what dose is best for you.  Getting vitamin B12 injections. Some people get these a few times a week. Others get them once a month. HOME CARE INSTRUCTIONS  Take all supplements as directed by your caregiver. Follow the directions carefully.  Get any injections your caregiver prescribes. Do not miss your appointments.  Eat lots of healthy foods that contain vitamin B12. Ask your caregiver if you should work with a nutritionist. Good things to include in your diet are:  Meat.  Poultry.  Fish.  Eggs.  Fortified cereal and dairy products. This means vitamin B12 has been added to the food. Check the label on the package to be sure.  Do not abuse alcohol.  Keep all follow-up appointments. Your caregiver will need to perform blood tests to make sure your vitamin B12 deficiency is going away. SEEK MEDICAL CARE IF:  You have any questions about your treatment.  Your symptoms come back. MAKE SURE YOU:  Understand these instructions.  Will watch your condition.  Will get help right away if you are not doing well or get worse. Document Released: 03/29/2011 Document Reviewed: 03/29/2011 [facility] Patient Information 2013 [place].  

## 2011-11-15 NOTE — Progress Notes (Signed)
  Subjective:    Patient ID: Joseph Tyler, male    DOB: January 22, 1960, 51 y.o.   MRN: [id number]  HPI    Review of Systems  Constitutional: Positive for activity change, appetite change and fatigue.  HENT: Positive for sneezing, neck pain and dental problem.   Eyes: Positive for visual disturbance.  Respiratory: Positive for cough.   Genitourinary: Positive for frequency.  Musculoskeletal: Positive for myalgias and back pain.  Neurological: Positive for weakness and numbness.  Hematological: Negative.   Psychiatric/Behavioral: Negative.        Objective:   Physical Exam        Assessment & Plan:
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
## 2011-11-16 ENCOUNTER — Telehealth: Payer: Self-pay | Admitting: Radiology

## 2011-11-16 ENCOUNTER — Encounter: Payer: Self-pay | Admitting: Radiology

## 2011-11-16 LAB — PSA: PSA: 1.24 ng/mL

## 2011-11-16 LAB — TSH: TSH: 1.587 u[IU]/mL (ref 0.350–4.500)

## 2011-11-16 NOTE — Addendum Note (Signed)
Addended by: Marinus Maw on: 11/16/2011 01:22 PM   Modules accepted: Orders

## 2011-11-16 NOTE — Telephone Encounter (Signed)
Called [facility] and added on TSH for Guest.

## 2012-03-29 DIAGNOSIS — N529 Male erectile dysfunction, unspecified: Secondary | ICD-10-CM | POA: Insufficient documentation

## 2012-03-29 DIAGNOSIS — M48061 Spinal stenosis, lumbar region without neurogenic claudication: Secondary | ICD-10-CM | POA: Insufficient documentation

## 2012-06-13 ENCOUNTER — Other Ambulatory Visit: Payer: Self-pay | Admitting: Internal Medicine

## 2012-07-26 ENCOUNTER — Ambulatory Visit (INDEPENDENT_AMBULATORY_CARE_PROVIDER_SITE_OTHER): Payer: Managed Care, Other (non HMO) | Admitting: Physician Assistant

## 2012-07-26 VITALS — BP 130/92 | HR 86 | Temp 98.0°F | Resp 16 | Ht 67.0 in | Wt 144.0 lb

## 2012-07-26 DIAGNOSIS — I10 Essential (primary) hypertension: Secondary | ICD-10-CM

## 2012-07-26 MED ORDER — LISINOPRIL 10 MG PO TABS: 10.0000 mg | ORAL_TABLET | Freq: Every day | ORAL | Status: DC

## 2012-07-26 NOTE — Progress Notes (Signed)
I have examined this patient along with the student and agree.  

## 2012-07-26 NOTE — Progress Notes (Signed)
  Subjective:    Patient ID: Joseph Tyler, male    DOB: [birth year], 52 y.o.   MRN: [id number]  HPI  Presents for lisinopril refill. States he has been out of medication for 6 days. Currently he is fasting for Ramadan. Pt states he has had a tetanus within the last 10 years and believes he had a colonoscopy in 2010. Denies headaches, dizziness, vision changes.  Review of Systems Denies headaches, fever, SOB, chest pain, abdominal pain, urinary symptoms    Objective:   Physical Exam  BP 130/92  Pulse 86  Temp(Src) 98 F (36.7 C) (Oral)  Resp 16  Ht 5\' 7"  (1.702 m)  Wt 144 lb (65.318 kg)  BMI 22.55 kg/m2  SpO2 98%  General: WDWN male, appears stated age, NAD HEENT: normocephalic, atraumatic, sclera/conjunctiva clear, neck supple, no palpable lymphadenopathy  Resp: good air entry, CTA bilaterally without rales rhonchi wheezes Cardiac: RRR, no murmurs rubs gallops  Extremities: moves all limbs spontaneously  Neuro: A&O x 3 Skin: no rashes lesions      Assessment & Plan:  HTN (hypertension) - Plan: Comprehensive metabolic panel, lisinopril (PRINIVIL,ZESTRIL) 10 MG tablet  Refill lisinopril.  RTC in 6 months for complete physical & med refill.   Meds ordered this encounter  Medications  . lisinopril (PRINIVIL,ZESTRIL) 10 MG tablet    Sig: Take 1 tablet (10 mg total) by mouth daily.    Dispense:  90 tablet    Refill:  1    Order Specific Question:  Supervising Provider    Answer:  DOOLITTLE, ROBERT P [[id number]]

## 2012-07-27 ENCOUNTER — Encounter: Payer: Self-pay | Admitting: Physician Assistant

## 2012-07-27 LAB — COMPREHENSIVE METABOLIC PANEL
ALT: <8 U/L (ref 0–53)
AST: 12 U/L (ref 0–37)
Albumin: 3.9 g/dL (ref 3.5–5.2)
Alkaline Phosphatase: 106 U/L (ref 39–117)
Glucose, Bld: 84 mg/dL (ref 70–99)
Potassium: 3.8 mEq/L (ref 3.5–5.3)
Sodium: 137 mEq/L (ref 135–145)
Total Bilirubin: 0.7 mg/dL (ref 0.3–1.2)
Total Protein: 6.2 g/dL (ref 6.0–8.3)

## 2013-01-14 ENCOUNTER — Other Ambulatory Visit: Payer: Self-pay | Admitting: Physician Assistant

## 2013-02-22 ENCOUNTER — Other Ambulatory Visit: Payer: Self-pay | Admitting: Internal Medicine

## 2013-03-01 ENCOUNTER — Ambulatory Visit (INDEPENDENT_AMBULATORY_CARE_PROVIDER_SITE_OTHER): Payer: Managed Care, Other (non HMO) | Admitting: Physician Assistant

## 2013-03-01 VITALS — BP 154/92 | HR 66 | Temp 98.0°F | Resp 16 | Ht 68.0 in | Wt 153.0 lb

## 2013-03-01 DIAGNOSIS — I10 Essential (primary) hypertension: Secondary | ICD-10-CM

## 2013-03-01 MED ORDER — LISINOPRIL 10 MG PO TABS: 10.0000 mg | ORAL_TABLET | Freq: Every day | ORAL | Status: DC

## 2013-03-01 NOTE — Progress Notes (Signed)
   Subjective:    Patient ID: Joseph Tyler, male    DOB: [birth year], 53 y.o.   MRN: [id number]  Hypertension Pertinent negatives include no chest pain, headaches, palpitations or shortness of breath.       Joseph Tyler is a pleasant 53 yr old male here for refills on HTN medication. He takes lisinopril 10mg .  Has been on this for about 19yrs.  Tolerates it well.  He has been out for 4 days and BP is consequently high today.  He occasionally checks [facility] at [facility], typically sees numbers 120s/80s.  He denies CP, SOB, HA, edema.  Last CPE Oct 2013.  Reports he called to make an appt at 104 and was told he could not make an appt for a physical.   Review of Systems  Constitutional: Negative for fever and chills.  Respiratory: Negative for cough and shortness of breath.   Cardiovascular: Negative for chest pain, palpitations and leg swelling.  Gastrointestinal: Negative.   Musculoskeletal: Negative.   Skin: Negative.   Neurological: Negative for headaches.       Objective:   Physical Exam  Vitals reviewed. Constitutional: He is oriented to person, place, and time. He appears well-developed and well-nourished. No distress.  HENT:  Head: Normocephalic and atraumatic.  Eyes: Conjunctivae are normal. No scleral icterus.  Cardiovascular: Normal rate, regular rhythm, normal heart sounds and intact distal pulses.   Pulmonary/Chest: Effort normal and breath sounds normal. He has no wheezes. He has no rales.  Neurological: He is alert and oriented to person, place, and time.  Skin: Skin is warm and dry.  Psychiatric: He has a normal mood and affect. His behavior is normal.       Assessment & Plan:  Hypertension - Plan: Basic metabolic panel, lisinopril (PRINIVIL,ZESTRIL) 10 MG tablet   Joseph Tyler is a 54 yr old male here for HTN refill.  BP has been well controlled on 10mg  lisinopril.  Refills sent to pharmacy.  BMP pending.  Encouraged pt to schedule CPE.  He has previously seen  Dr. Elder Cyphers, who is no longer seeing pt's by appt - possible that pt misunderstood when he previously tried to schedule.  Discussed with him that he could schedule with another provider which he now plans to do.  Check home BPs periodically.  RTC in about 6 months, or for CPE prior to that.   Joseph Tyler MHS, PA-C [facility] 2/12/20152:33 PM

## 2013-03-01 NOTE — Patient Instructions (Signed)
Keep taking your blood pressure medicine every day.  Check your blood pressure at the pharmacy every now and then - if you see numbers >140/>90 then we may need to adjust your medication  Make an appointment for a physical next door.  You will have to make an appointment with a new provider as Dr. Elder Cyphers is no longer seeing patients by appointment   Hypertension As your heart beats, it forces blood through your arteries. This force is your blood pressure. If the pressure is too high, it is called hypertension (HTN) or high blood pressure. HTN is dangerous because you may have it and not know it. High blood pressure may mean that your heart has to work harder to pump blood. Your arteries may be narrow or stiff. The extra work puts you at risk for heart disease, stroke, and other problems.  Blood pressure consists of two numbers, a higher number over a lower, 110/72, for example. It is stated as "110 over 72." The ideal is below 120 for the top number (systolic) and under 80 for the bottom (diastolic). Write down your blood pressure today. You should pay close attention to your blood pressure if you have certain conditions such as:  Heart failure.  Prior heart attack.  Diabetes  Chronic kidney disease.  Prior stroke.  Multiple risk factors for heart disease. To see if you have HTN, your blood pressure should be measured while you are seated with your arm held at the level of the heart. It should be measured at least twice. A one-time elevated blood pressure reading (especially in the Emergency Department) does not mean that you need treatment. There may be conditions in which the blood pressure is different between your right and left arms. It is important to see your caregiver soon for a recheck. Most people have essential hypertension which means that there is not a specific cause. This type of high blood pressure may be lowered by changing lifestyle factors such  as:  Stress.  Smoking.  Lack of exercise.  Excessive weight.  Drug/tobacco/alcohol use.  Eating less salt. Most people do not have symptoms from high blood pressure until it has caused damage to the body. Effective treatment can often prevent, delay or reduce that damage. TREATMENT  When a cause has been identified, treatment for high blood pressure is directed at the cause. There are a large number of medications to treat HTN. These fall into several categories, and your caregiver will help you select the medicines that are best for you. Medications may have side effects. You should review side effects with your caregiver. If your blood pressure stays high after you have made lifestyle changes or started on medicines,   Your medication(s) may need to be changed.  Other problems may need to be addressed.  Be certain you understand your prescriptions, and know how and when to take your medicine.  Be sure to follow up with your caregiver within the time frame advised (usually within two weeks) to have your blood pressure rechecked and to review your medications.  If you are taking more than one medicine to lower your blood pressure, make sure you know how and at what times they should be taken. Taking two medicines at the same time can result in blood pressure that is too low. SEEK IMMEDIATE MEDICAL CARE IF:  You develop a severe headache, blurred or changing vision, or confusion.  You have unusual weakness or numbness, or a faint feeling.  You have severe  chest or abdominal pain, vomiting, or breathing problems. MAKE SURE YOU:   Understand these instructions.  Will watch your condition.  Will get help right away if you are not doing well or get worse. Document Released: 01/04/2005 Document Revised: 03/29/2011 Document Reviewed: 08/25/2007 [facility] Patient Information 2014 [place].

## 2013-03-02 LAB — BASIC METABOLIC PANEL
BUN: 7 mg/dL (ref 6–23)
CALCIUM: 9 mg/dL (ref 8.4–10.5)
CHLORIDE: 107 meq/L (ref 96–112)
CO2: 25 mEq/L (ref 19–32)
CREATININE: 0.92 mg/dL (ref 0.50–1.35)
Glucose, Bld: 104 mg/dL — ABNORMAL HIGH (ref 70–99)
Potassium: 4.3 mEq/L (ref 3.5–5.3)
Sodium: 139 mEq/L (ref 135–145)

## 2013-03-07 ENCOUNTER — Telehealth: Payer: Self-pay | Admitting: Physician Assistant

## 2013-03-07 NOTE — Telephone Encounter (Signed)
Left vm for patient to give [place] a call back to schedule an appointment for a cpe  With an provider other than Dr Elder Cyphers

## 2013-03-26 ENCOUNTER — Ambulatory Visit: Payer: Managed Care, Other (non HMO)

## 2013-03-26 ENCOUNTER — Ambulatory Visit (INDEPENDENT_AMBULATORY_CARE_PROVIDER_SITE_OTHER): Payer: Managed Care, Other (non HMO) | Admitting: Internal Medicine

## 2013-03-26 VITALS — BP 144/90 | HR 69 | Temp 97.7°F | Resp 16 | Ht 67.5 in | Wt 146.6 lb

## 2013-03-26 DIAGNOSIS — R319 Hematuria, unspecified: Secondary | ICD-10-CM

## 2013-03-26 DIAGNOSIS — R109 Unspecified abdominal pain: Secondary | ICD-10-CM

## 2013-03-26 DIAGNOSIS — R1084 Generalized abdominal pain: Secondary | ICD-10-CM

## 2013-03-26 DIAGNOSIS — K5900 Constipation, unspecified: Secondary | ICD-10-CM

## 2013-03-26 LAB — POCT URINALYSIS DIPSTICK
Bilirubin, UA: NEGATIVE
GLUCOSE UA: NEGATIVE
Ketones, UA: NEGATIVE
Leukocytes, UA: NEGATIVE
Nitrite, UA: NEGATIVE
PH UA: 6.5
Protein, UA: NEGATIVE
Spec Grav, UA: 1.015
Urobilinogen, UA: 0.2

## 2013-03-26 LAB — POCT CBC
Granulocyte percent: 66.8 %G (ref 37–80)
HEMATOCRIT: 41 % — AB (ref 43.5–53.7)
HEMOGLOBIN: 13.2 g/dL — AB (ref 14.1–18.1)
LYMPH, POC: 1.3 (ref 0.6–3.4)
MCH, POC: 27.5 pg (ref 27–31.2)
MCHC: 32.2 g/dL (ref 31.8–35.4)
MCV: 85.5 fL (ref 80–97)
MID (cbc): 0.4 (ref 0–0.9)
MPV: 9.6 fL (ref 0–99.8)
POC GRANULOCYTE: 3.5 (ref 2–6.9)
POC LYMPH %: 24.8 % (ref 10–50)
POC MID %: 8.4 %M (ref 0–12)
Platelet Count, POC: 237 10*3/uL (ref 142–424)
RBC: 4.8 M/uL (ref 4.69–6.13)
RDW, POC: 14.7 %
WBC: 5.3 10*3/uL (ref 4.6–10.2)

## 2013-03-26 LAB — POCT UA - MICROSCOPIC ONLY
CASTS, UR, LPF, POC: NEGATIVE
Crystals, Ur, HPF, POC: NEGATIVE
MUCUS UA: POSITIVE
WBC, UR, HPF, POC: NEGATIVE
Yeast, UA: NEGATIVE

## 2013-03-26 LAB — POCT SEDIMENTATION RATE: POCT SED RATE: 32 mm/hr — AB (ref 0–22)

## 2013-03-26 MED ORDER — CIPROFLOXACIN HCL 500 MG PO TABS: 500.0000 mg | ORAL_TABLET | Freq: Two times a day (BID) | ORAL | Status: DC

## 2013-03-26 NOTE — Progress Notes (Signed)
Subjective:    Patient ID: Joseph Tyler, male    DOB: [birth year], 53 y.o.   MRN: [id number]  HPI 53 year old male complains of abdominal pain radiating to chest and back. Pain started five days ago and is progressively getting worse. He has not been able to eat much but is able to drink fluids. Patient states he has not really been hungry. He also feels he is constipated. The pain is sharp all over. He thought it was gas at first. No fever, diarrhea, dysuria, frequency, vomiting. Stools are hard and painful. Colonoscopy normal 5 yrs Worried about work.  Review of Systems htn doing well    Objective:   Physical Exam  Vitals reviewed. Constitutional: He is oriented to person, place, and time. He appears well-developed and well-nourished. He appears distressed.  HENT:  Head: Normocephalic.  Mouth/Throat: Oropharynx is clear and moist.  Eyes: EOM are normal. No scleral icterus.  Neck: Normal range of motion. Neck supple.  Cardiovascular: Normal rate, regular rhythm and normal heart sounds.   Pulmonary/Chest: Effort normal and breath sounds normal.  Abdominal: He exhibits no distension and no mass. There is tenderness. There is no rebound and no guarding.  Neurological: He is alert and oriented to person, place, and time. He exhibits normal muscle tone. Coordination normal.  Psychiatric: He has a normal mood and affect. His behavior is normal.   [facility] reading (PRIMARY) by  Dr Jules Vidovich no stone seen, no free air, no obstruction  Results for orders placed in visit on 03/26/13  POCT CBC      Result Value Ref Range   WBC 5.3  4.6 - 10.2 K/uL   Lymph, poc 1.3  0.6 - 3.4   POC LYMPH PERCENT 24.8  10 - 50 %L   MID (cbc) 0.4  0 - 0.9   POC MID % 8.4  0 - 12 %M   POC Granulocyte 3.5  2 - 6.9   Granulocyte percent 66.8  37 - 80 %G   RBC 4.80  4.69 - 6.13 M/uL   Hemoglobin 13.2 (*) 14.1 - 18.1 g/dL   HCT, POC 41.0 (*) 43.5 - 53.7 %   MCV 85.5  80 - 97 fL   MCH, POC 27.5  27 - 31.2 pg   MCHC 32.2  31.8 - 35.4 g/dL   RDW, POC 14.7     Platelet Count, POC 237  142 - 424 K/uL   MPV 9.6  0 - 99.8 fL  POCT SEDIMENTATION RATE      Result Value Ref Range   POCT SED RATE    0 - 22 mm/hr  POCT UA - MICROSCOPIC ONLY      Result Value Ref Range   WBC, Ur, HPF, POC Neg     RBC, urine, microscopic 20-25     Bacteria, U Microscopic 1+     Mucus, UA Pos     Epithelial cells, urine per micros 0-2     Crystals, Ur, HPF, POC neg     Casts, Ur, LPF, POC neg     Yeast, UA neg    POCT URINALYSIS DIPSTICK      Result Value Ref Range   Color, UA yellow     Clarity, UA slight cloudy     Glucose, UA neg     Bilirubin, UA neg     Ketones, UA neg     Spec Grav, UA 1.015     Blood, UA moderate  pH, UA 6.5     Protein, UA neg     Urobilinogen, UA 0.2     Nitrite, UA neg     Leukocytes, UA Negative     .  CS urine       Assessment & Plan:  Diffuse/Generalized abdominal pain. Hematuria/CS urine/Cipro 500mg  bid/Strain urine Miralax Return Friday see me/consider CT abdom

## 2013-03-26 NOTE — Patient Instructions (Addendum)
High-Fiber Diet Fiber is found in fruits, vegetables, and grains. A high-fiber diet encourages the addition of more whole grains, legumes, fruits, and vegetables in your diet. The recommended amount of fiber for adult males is 38 g per day. For adult females, it is 25 g per day. Pregnant and lactating women should get 28 g of fiber per day. If you have a digestive or bowel problem, ask your caregiver for advice before adding high-fiber foods to your diet. Eat a variety of high-fiber foods instead of only a select few type of foods.  PURPOSE  To increase stool bulk.  To make bowel movements more regular to prevent constipation.  To lower cholesterol.  To prevent overeating. WHEN IS THIS DIET USED?  It may be used if you have constipation and hemorrhoids.  It may be used if you have uncomplicated diverticulosis (intestine condition) and irritable bowel syndrome.  It may be used if you need help with weight management.  It may be used if you want to add it to your diet as a protective measure against atherosclerosis, diabetes, and cancer. SOURCES OF FIBER  Whole-grain breads and cereals.  Fruits, such as apples, oranges, bananas, berries, prunes, and pears.  Vegetables, such as green peas, carrots, sweet potatoes, beets, broccoli, cabbage, spinach, and artichokes.  Legumes, such split peas, soy, lentils.  Almonds. FIBER CONTENT IN FOODS Starches and Grains / Dietary Fiber (g)  Cheerios, 1 cup / 3 g  Corn Flakes cereal, 1 cup / 0.7 g  Rice crispy treat cereal, 1 cup / 0.3 g  Instant oatmeal (cooked),  cup / 2 g  Frosted wheat cereal, 1 cup / 5.1 g  Brown, long-grain rice (cooked), 1 cup / 3.5 g  White, long-grain rice (cooked), 1 cup / 0.6 g  Enriched macaroni (cooked), 1 cup / 2.5 g Legumes / Dietary Fiber (g)  Baked beans (canned, plain, or vegetarian),  cup / 5.2 g  Kidney beans (canned),  cup / 6.8 g  Pinto beans (cooked),  cup / 5.5 g Breads and Crackers  / Dietary Fiber (g)  Plain or honey graham crackers, 2 squares / 0.7 g  Saltine crackers, 3 squares / 0.3 g  Plain, salted pretzels, 10 pieces / 1.8 g  Whole-wheat bread, 1 slice / 1.9 g  White bread, 1 slice / 0.7 g  Raisin bread, 1 slice / 1.2 g  Plain bagel, 3 oz / 2 g  Flour tortilla, 1 oz / 0.9 g  Corn tortilla, 1 small / 1.5 g  Hamburger or hotdog bun, 1 small / 0.9 g Fruits / Dietary Fiber (g)  Apple with skin, 1 medium / 4.4 g  Sweetened applesauce,  cup / 1.5 g  Banana,  medium / 1.5 g  Grapes, 10 grapes / 0.4 g  Orange, 1 small / 2.3 g  Raisin, 1.5 oz / 1.6 g  Melon, 1 cup / 1.4 g Vegetables / Dietary Fiber (g)  Green beans (canned),  cup / 1.3 g  Carrots (cooked),  cup / 2.3 g  Broccoli (cooked),  cup / 2.8 g  Peas (cooked),  cup / 4.4 g  Mashed potatoes,  cup / 1.6 g  Lettuce, 1 cup / 0.5 g  Corn (canned),  cup / 1.6 g  Tomato,  cup / 1.1 g Document Released: 01/04/2005 Document Revised: 07/06/2011 Document Reviewed: 04/08/2011 [facility] Patient Information 2014 [place], [place]. Constipation, Adult Constipation is when a person has fewer than 3 bowel movements a week; has  difficulty having a bowel movement; or has stools that are dry, hard, or larger than normal. As people grow older, constipation is more common. If you try to fix constipation with medicines that make you have a bowel movement (laxatives), the problem may get worse. Long-term laxative use may cause the muscles of the colon to become weak. A low-fiber diet, not taking in enough fluids, and taking certain medicines may make constipation worse. CAUSES   Certain medicines, such as antidepressants, pain medicine, iron supplements, antacids, and water pills.   Certain diseases, such as diabetes, irritable bowel syndrome (IBS), thyroid disease, or depression.   Not drinking enough water.   Not eating enough fiber-rich foods.   Stress or travel.  Lack of physical  activity or exercise.  Not going to the restroom when there is the urge to have a bowel movement.  Ignoring the urge to have a bowel movement.  Using laxatives too much. SYMPTOMS   Having fewer than 3 bowel movements a week.   Straining to have a bowel movement.   Having hard, dry, or larger than normal stools.   Feeling full or bloated.   Pain in the lower abdomen.  Not feeling relief after having a bowel movement. DIAGNOSIS  Your caregiver will take a medical history and perform a physical exam. Further testing may be done for severe constipation. Some tests may include:   A barium enema X-ray to examine your rectum, colon, and sometimes, your small intestine.  A sigmoidoscopy to examine your lower colon.  A colonoscopy to examine your entire colon. TREATMENT  Treatment will depend on the severity of your constipation and what is causing it. Some dietary treatments include drinking more fluids and eating more fiber-rich foods. Lifestyle treatments may include regular exercise. If these diet and lifestyle recommendations do not help, your caregiver may recommend taking over-the-counter laxative medicines to help you have bowel movements. Prescription medicines may be prescribed if over-the-counter medicines do not work.  HOME CARE INSTRUCTIONS   Increase dietary fiber in your diet, such as fruits, vegetables, whole grains, and beans. Limit high-fat and processed sugars in your diet, such as [place] fries, hamburgers, cookies, candies, and soda.   A fiber supplement may be added to your diet if you cannot get enough fiber from foods.   Drink enough fluids to keep your urine clear or pale yellow.   Exercise regularly or as directed by your caregiver.   Go to the restroom when you have the urge to go. Do not hold it.  Only take medicines as directed by your caregiver. Do not take other medicines for constipation without talking to your caregiver first. [place] IF:   You have bright red blood in your stool.   Your constipation lasts for more than 4 days or gets worse.   You have abdominal or rectal pain.   You have thin, pencil-like stools.  You have unexplained weight loss. MAKE SURE YOU:   Understand these instructions.  Will watch your condition.  Will get help right away if you are not doing well or get worse. Document Released: 10/03/2003 Document Revised: 03/29/2011 Document Reviewed: 10/16/2012 [facility] Patient Information 2014 [place], [place]. Hematuria, Adult Hematuria is blood in your urine. It can be caused by a bladder infection, kidney infection, prostate infection, kidney stone, or cancer of your urinary tract. Infections can usually be treated with medicine, and a kidney stone usually will pass through your urine. If neither of these is the  cause of your hematuria, further workup to find out the reason may be needed. It is very important that you tell your health care provider about any blood you see in your urine, even if the blood stops without treatment or happens without causing pain. Blood in your urine that happens and then stops and then happens again can be a symptom of a very serious condition. Also, pain is not a symptom in the initial stages of many urinary cancers. HOME CARE INSTRUCTIONS   Drink lots of fluid, 3 4 quarts a day. If you have been diagnosed with an infection, cranberry juice is especially recommended, in addition to large amounts of water.  Avoid caffeine, tea, and carbonated beverages, because they tend to irritate the bladder.  Avoid alcohol because it may irritate the prostate.  Only take over-the-counter or prescription medicines for pain, discomfort, or fever as directed by your health care provider.  If you have been diagnosed with a kidney stone, follow your health care provider's instructions regarding straining your urine to catch the stone.  Empty your bladder often. Avoid  holding urine for long periods of time.  After a bowel movement, women should cleanse front to back. Use each tissue only once.  Empty your bladder before and after sexual intercourse if you are a male. SEEK MEDICAL CARE IF: You develop back pain, fever, a feeling of sickness in your stomach (nausea), or vomiting or if your symptoms are not better in 3 days. Return sooner if you are getting worse. SEEK IMMEDIATE MEDICAL CARE IF:   You have a persistent fever, with a temperature of 101.30F (38.8C) or greater.  You develop severe vomiting and are unable to keep the medicine down.  You develop severe back or abdominal pain despite taking your medicines.  You begin passing a large amount of blood or clots in your urine.  You feel extremely weak or faint, or you pass out. MAKE SURE YOU:   Understand these instructions.  Will watch your condition.  Will get help right away if you are not doing well or get worse. Document Released: 01/04/2005 Document Revised: 10/25/2012 Document Reviewed: 09/04/2012 [facility] Patient Information 2014 [place].

## 2013-03-27 LAB — URINE CULTURE
Colony Count: NO GROWTH
ORGANISM ID, BACTERIA: NO GROWTH

## 2013-03-30 ENCOUNTER — Ambulatory Visit (INDEPENDENT_AMBULATORY_CARE_PROVIDER_SITE_OTHER): Payer: Managed Care, Other (non HMO) | Admitting: Internal Medicine

## 2013-03-30 ENCOUNTER — Telehealth: Payer: Self-pay | Admitting: Physician Assistant

## 2013-03-30 VITALS — BP 120/80 | HR 71 | Temp 98.2°F | Resp 16 | Ht 71.0 in | Wt 146.0 lb

## 2013-03-30 DIAGNOSIS — R109 Unspecified abdominal pain: Secondary | ICD-10-CM

## 2013-03-30 DIAGNOSIS — R319 Hematuria, unspecified: Secondary | ICD-10-CM

## 2013-03-30 LAB — POCT UA - MICROSCOPIC ONLY
Bacteria, U Microscopic: NEGATIVE
Casts, Ur, LPF, POC: NEGATIVE
Crystals, Ur, HPF, POC: NEGATIVE
Epithelial cells, urine per micros: NEGATIVE
Mucus, UA: NEGATIVE
WBC, UR, HPF, POC: NEGATIVE
Yeast, UA: NEGATIVE

## 2013-03-30 LAB — POCT URINALYSIS DIPSTICK
BILIRUBIN UA: NEGATIVE
GLUCOSE UA: NEGATIVE
KETONES UA: NEGATIVE
Leukocytes, UA: NEGATIVE
Nitrite, UA: NEGATIVE
Protein, UA: NEGATIVE
Spec Grav, UA: <=1.005
Urobilinogen, UA: 0.2
pH, UA: 6

## 2013-03-30 LAB — COMPREHENSIVE METABOLIC PANEL WITH GFR
ALT: 11 U/L (ref 0–53)
AST: 14 U/L (ref 0–37)
Albumin: 4.1 g/dL (ref 3.5–5.2)
Alkaline Phosphatase: 106 U/L (ref 39–117)
BUN: 8 mg/dL (ref 6–23)
CO2: 27 meq/L (ref 19–32)
Calcium: 9.9 mg/dL (ref 8.4–10.5)
Chloride: 104 meq/L (ref 96–112)
Creat: 0.88 mg/dL (ref 0.50–1.35)
Glucose, Bld: 90 mg/dL (ref 70–99)
Potassium: 4.3 meq/L (ref 3.5–5.3)
Sodium: 139 meq/L (ref 135–145)
Total Bilirubin: 0.7 mg/dL (ref 0.2–1.2)
Total Protein: 6.7 g/dL (ref 6.0–8.3)

## 2013-03-30 LAB — LIPASE: Lipase: 30 U/L (ref 0–75)

## 2013-03-30 LAB — IFOBT (OCCULT BLOOD): IFOBT: NEGATIVE

## 2013-03-30 MED ORDER — HYDROCODONE-ACETAMINOPHEN 5-325 MG PO TABS: 1.0000 | ORAL_TABLET | Freq: Four times a day (QID) | ORAL | Status: DC | PRN

## 2013-03-30 NOTE — Progress Notes (Signed)
Subjective:    Patient ID: Joseph Tyler, male    DOB: Jun 12, 1960, 53 y.o.   MRN: [id number]  HPI Patient seen here Monday.  When he came in Monday patient was suffering with constipation.  Patient says that resolved but still in tremendous pain.  Pain located across abdomen and into back. He took antibiotic for hematuria and sees no blood in urine. Culture is no growth. Pain is about the same, across abdomen and into back., mostly right flank. Pain sharp and severe at times.  Constipation resolve with miralax  Review of Systems     Objective:   Physical Exam  Constitutional: He is oriented to person, place, and time. He appears well-developed and well-nourished. He appears distressed.  HENT:  Head: Normocephalic.  Mouth/Throat: No oropharyngeal exudate.  Eyes: EOM are normal. No scleral icterus.  Neck: Normal range of motion. Neck supple.  Cardiovascular: Normal rate.   Pulmonary/Chest: Effort normal and breath sounds normal.  Abdominal: Soft. He exhibits no distension and no mass. There is tenderness. There is no rebound and no guarding.  Genitourinary: Rectum normal and prostate normal. No penile tenderness.  Neurological: He is alert and oriented to person, place, and time. He exhibits normal muscle tone. Coordination normal.  Skin: No rash noted.  Psychiatric: He has a normal mood and affect. His behavior is normal.     Results for orders placed in visit on 03/30/13  POCT UA - MICROSCOPIC ONLY      Result Value Ref Range   WBC, Ur, HPF, POC NEG     RBC, urine, microscopic 0-1     Bacteria, U Microscopic NEG     Mucus, UA NEG     Epithelial cells, urine per micros NEG     Crystals, Ur, HPF, POC NEG     Casts, Ur, LPF, POC NEG     Yeast, UA NEG    POCT URINALYSIS DIPSTICK      Result Value Ref Range   Color, UA YELLOW     Clarity, UA CLEAR     Glucose, UA NEG     Bilirubin, UA NEG     Ketones, UA NEG     Spec Grav, UA <=1.005     Blood, UA MOD     pH, UA  6.0     Protein, UA NEG     Urobilinogen, UA 0.2     Nitrite, UA NEG     Leukocytes, UA Negative     cmet/lipase Results for orders placed in visit on 03/30/13  POCT UA - MICROSCOPIC ONLY      Result Value Ref Range   WBC, Ur, HPF, POC NEG     RBC, urine, microscopic 0-1     Bacteria, U Microscopic NEG     Mucus, UA NEG     Epithelial cells, urine per micros NEG     Crystals, Ur, HPF, POC NEG     Casts, Ur, LPF, POC NEG     Yeast, UA NEG    POCT URINALYSIS DIPSTICK      Result Value Ref Range   Color, UA YELLOW     Clarity, UA CLEAR     Glucose, UA NEG     Bilirubin, UA NEG     Ketones, UA NEG     Spec Grav, UA <=1.005     Blood, UA MOD     pH, UA 6.0     Protein, UA NEG     Urobilinogen, UA  0.2     Nitrite, UA NEG     Leukocytes, UA Negative    IFOBT (OCCULT BLOOD)      Result Value Ref Range   IFOBT Negative         Assessment & Plan:  Right abdominal pain Hematuria resolved/culture negative CT scan/ Vicodin 5/325/Clear liquid diet

## 2013-03-30 NOTE — Telephone Encounter (Signed)
Called to the phone to take a radiology report "code critical" from Dr. Elliot Dally.  The patient had a CT of the abd/pelvis to evaluate continued RIGHT abdominal pain.  CBC was normal.  CMET and Lipase are pending.  Acute pancreatitis, mild, involving the pancreatic head. No diverticulitis. No stones.  The patient is no longer at the imaging facility.  Dr. Elder Cyphers notified of the result and advised very low fat diet, no alcohol, bland diet vs. Clear liquids. He prescribed hydrocodone for pain at today's visit. The patient should follow-up here on Sunday 04/01/13, sooner if pain worsens.

## 2013-03-30 NOTE — Patient Instructions (Addendum)
Flank Pain Flank pain refers to pain that is located on the side of the body between the upper abdomen and the back. The pain may occur over a short period of time (acute) or may be long-term or reoccurring (chronic). It may be mild or severe. Flank pain can be caused by many things. CAUSES  Some of the more common causes of flank pain include:  Muscle strains.   Muscle spasms.   A disease of your spine (vertebral disk disease).   A lung infection (pneumonia).   Fluid around your lungs (pulmonary edema).   A kidney infection.   Kidney stones.   A very painful skin rash caused by the chickenpox virus (shingles).   Gallbladder disease.  [place] care will depend on the cause of your pain. In general,  Rest as directed by your caregiver.  Drink enough fluids to keep your urine clear or pale yellow.  Only take over-the-counter or prescription medicines as directed by your caregiver. Some medicines may help relieve the pain.  Tell your caregiver about any changes in your pain.  Follow up with your caregiver as directed. SEEK IMMEDIATE MEDICAL CARE IF:   Your pain is not controlled with medicine.   You have new or worsening symptoms.  Your pain increases.   You have abdominal pain.   You have shortness of breath.   You have persistent nausea or vomiting.   You have swelling in your abdomen.   You feel faint or pass out.   You have blood in your urine.  You have a fever or persistent symptoms for more than 2 3 days.  You have a fever and your symptoms suddenly get worse. MAKE SURE YOU:   Understand these instructions.  Will watch your condition.  Will get help right away if you are not doing well or get worse. Document Released: 02/25/2005 Document Revised: 09/29/2011 Document Reviewed: 08/19/2011 [facility] Patient Information 2014 [place]. Abdominal Pain, Adult Many things can cause abdominal pain. Usually,  abdominal pain is not caused by a disease and will improve without treatment. It can often be observed and treated at home. Your health care provider will do a physical exam and possibly order blood tests and X-rays to help determine the seriousness of your pain. However, in many cases, more time must pass before a clear cause of the pain can be found. Before that point, your health care provider may not know if you need more testing or further treatment. HOME CARE INSTRUCTIONS  Monitor your abdominal pain for any changes. The following actions may help to alleviate any discomfort you are experiencing:  Only take over-the-counter or prescription medicines as directed by your health care provider.  Do not take laxatives unless directed to do so by your health care provider.  Try a clear liquid diet (broth, tea, or water) as directed by your health care provider. Slowly move to a bland diet as tolerated. SEEK MEDICAL CARE IF:  You have unexplained abdominal pain.  You have abdominal pain associated with nausea or diarrhea.  You have pain when you urinate or have a bowel movement.  You experience abdominal pain that wakes you in the night.  You have abdominal pain that is worsened or improved by eating food.  You have abdominal pain that is worsened with eating fatty foods. SEEK IMMEDIATE MEDICAL CARE IF:   Your pain does not go away within 2 hours.  You have a fever.  You keep throwing up (  vomiting).  Your pain is felt only in portions of the abdomen, such as the right side or the left lower portion of the abdomen.  You pass bloody or black tarry stools. MAKE SURE YOU:  Understand these instructions.   Will watch your condition.   Will get help right away if you are not doing well or get worse.  Document Released: 10/14/2004 Document Revised: 10/25/2012 Document Reviewed: 09/13/2012 [facility] Patient Information 2014 [place]. Diet The clear liquid diet consists of  foods that are liquid or will become liquid at room temperature. Examples of foods allowed on a clear liquid diet include fruit juice, broth or bouillon, gelatin, or frozen ice pops. You should be able to see through the liquid. The purpose of this diet is to provide the necessary fluids, electrolytes (such as sodium and potassium), and energy to keep the body functioning during times when you are not able to consume a regular diet. A clear liquid diet should not be continued for long periods of time, as it is not nutritionally adequate.  A CLEAR LIQUID DIET MAY BE NEEDED:  When a sudden-onset (acute) condition occurs before or after surgery.   As the first step in oral feeding.   For fluid and electrolyte replacement in diarrheal diseases.   As a diet before certain medical tests are performed.  ADEQUACY The clear liquid diet is adequate only in ascorbic acid, according to the Recommended Dietary Allowances of the [employer].  CHOOSING FOODS Breads and Starches  Allowed: None are allowed.   Avoid: All are to be avoided.  Vegetables  Allowed: Strained vegetable juices.   Avoid: Any others.  Fruit  Allowed: Strained fruit juices and fruit drinks. Include 1 serving of citrus or vitamin C-enriched fruit juice daily.   Avoid: Any others.  Meat and Meat Substitutes  Allowed: None are allowed.   Avoid: All are to be avoided.  Milk Products  Allowed: None are allowed.   Avoid: All are to be avoided.  Soups and Combination Foods  Allowed: Clear bouillon, broth, or strained broth-based soups.   Avoid: Any others.  Desserts and Sweets  Allowed: Sugar, honey. High-protein gelatin. Flavored gelatin, ices, or frozen ice pops that do not contain milk.   Avoid: Any others.  Fats and Oils  Allowed: None are allowed.   Avoid: All are to be avoided.  Beverages  Allowed: Cereal beverages, coffee (regular or  decaffeinated), tea, or soda at the discretion of your health care provider.   Avoid: Any others.  Condiments  Allowed: Salt.   Avoid: Any others, including pepper.  Supplements  Allowed: Liquid nutrition beverages that you can see through.   Avoid: Any others that contain lactose or fiber. SAMPLE MEAL PLAN Breakfast  4 oz (120 mL) strained orange juice.   to 1 cup (120 to 240 mL) gelatin (plain or fortified).  1 cup (240 mL) beverage (coffee or tea).  Sugar, if desired. Midmorning Snack   cup (120 mL) gelatin (plain or fortified). Lunch  1 cup (240 mL) broth or consomm.  4 oz (120 mL) strained grapefruit juice.   cup (120 mL) gelatin (plain or fortified).  1 cup (240 mL) beverage (coffee or tea).  Sugar, if desired. Midafternoon Snack   cup (120 mL) fruit ice.   cup (120 mL) strained fruit juice. Dinner  1 cup (240 mL) broth or consomm.   cup (120 mL) cranberry juice.   cup (120 mL) flavored gelatin (plain or fortified).  1 cup (240 mL) beverage (coffee or tea).  Sugar, if desired. Evening Snack  4 oz (120 mL) strained apple juice (vitamin C-fortified).   cup (120 mL) flavored gelatin (plain or fortified). MAKE SURE YOU:  Understand these instructions.  Will watch your child's condition.  Will get help right away if your child is not doing well or gets worse. Document Released: 01/04/2005 Document Revised: 09/06/2012 Document Reviewed: 06/06/2012 [facility] Patient Information 2014 [place].

## 2013-04-01 ENCOUNTER — Ambulatory Visit (INDEPENDENT_AMBULATORY_CARE_PROVIDER_SITE_OTHER): Payer: Managed Care, Other (non HMO) | Admitting: Emergency Medicine

## 2013-04-01 VITALS — BP 128/80 | HR 68 | Temp 98.4°F | Resp 16 | Ht 68.0 in | Wt 147.0 lb

## 2013-04-01 DIAGNOSIS — K859 Acute pancreatitis without necrosis or infection, unspecified: Secondary | ICD-10-CM

## 2013-04-01 LAB — POCT CBC
Granulocyte percent: 62.3 %G (ref 37–80)
HCT, POC: 39.7 % — AB (ref 43.5–53.7)
Hemoglobin: 12.8 g/dL — AB (ref 14.1–18.1)
Lymph, poc: 1.5 (ref 0.6–3.4)
MCH, POC: 27.6 pg (ref 27–31.2)
MCHC: 32.2 g/dL (ref 31.8–35.4)
MCV: 85.8 fL (ref 80–97)
MID (CBC): 0.5 (ref 0–0.9)
MPV: 9.7 fL (ref 0–99.8)
PLATELET COUNT, POC: 252 10*3/uL (ref 142–424)
POC Granulocyte: 3.3 (ref 2–6.9)
POC LYMPH PERCENT: 27.6 %L (ref 10–50)
POC MID %: 10.1 % (ref 0–12)
RBC: 4.63 M/uL — AB (ref 4.69–6.13)
RDW, POC: 14.1 %
WBC: 5.3 10*3/uL (ref 4.6–10.2)

## 2013-04-01 NOTE — Addendum Note (Signed)
Addended by: Constance Goltz on: 04/01/2013 07:06 PM   Modules accepted: Orders

## 2013-04-01 NOTE — Patient Instructions (Signed)
Acute Pancreatitis °Acute pancreatitis is a disease in which the pancreas becomes suddenly inflamed. The pancreas is a large gland located behind your stomach. The pancreas produces enzymes that help digest food. The pancreas also releases the hormones glucagon and insulin that help regulate blood sugar. Damage to the pancreas occurs when the digestive enzymes from the pancreas are activated and begin attacking the pancreas before being released into the intestine. Most acute attacks last a couple of days and can cause serious complications. Some people become dehydrated and develop low blood pressure. In severe cases, bleeding into the pancreas can lead to shock and can be life-threatening. The lungs, heart, and kidneys may fail. °CAUSES  °Pancreatitis can happen to anyone. In some cases, the cause is unknown. Most cases are caused by: °· Alcohol abuse. °· Gallstones. °Other less common causes are: °· Certain medicines. °· Exposure to certain chemicals. °· Infection. °· Damage caused by an accident (trauma). °· Abdominal surgery. °SYMPTOMS  °· Pain in the upper abdomen that may radiate to the back. °· Tenderness and swelling of the abdomen. °· Nausea and vomiting. °DIAGNOSIS  °Your caregiver will perform a physical exam. Blood and stool tests may be done to confirm the diagnosis. Imaging tests may also be done, such as X-rays, CT scans, or an ultrasound of the abdomen. °TREATMENT  °Treatment usually requires a stay in the hospital. Treatment may include: °· Pain medicine. °· Fluid replacement through an intravenous line (IV). °· Placing a tube in the stomach to remove stomach contents and control vomiting. °· Not eating for 3 or 4 days. This gives your pancreas a rest, because enzymes are not being produced that can cause further damage. °· Antibiotic medicines if your condition is caused by an infection. °· Surgery of the pancreas or gallbladder. °HOME CARE INSTRUCTIONS  °· Follow the diet advised by your  caregiver. This may involve avoiding alcohol and decreasing the amount of fat in your diet. °· Eat smaller, more frequent meals. This reduces the amount of digestive juices the pancreas produces. °· Drink enough fluids to keep your urine clear or pale yellow. °· Only take over-the-counter or prescription medicines as directed by your caregiver. °· Avoid drinking alcohol if it caused your condition. °· Do not smoke. °· Get plenty of rest. °· Check your blood sugar at home as directed by your caregiver. °· Keep all follow-up appointments as directed by your caregiver. °SEEK MEDICAL CARE IF:  °· You do not recover as quickly as expected. °· You develop new or worsening symptoms. °· You have persistent pain, weakness, or nausea. °· You recover and then have another episode of pain. °SEEK IMMEDIATE MEDICAL CARE IF:  °· You are unable to eat or keep fluids down. °· Your pain becomes severe. °· You have a fever or persistent symptoms for more than 2 to 3 days. °· You have a fever and your symptoms suddenly get worse. °· Your skin or the white part of your eyes turn yellow (jaundice). °· You develop vomiting. °· You feel dizzy, or you faint. °· Your blood sugar is high (over 300 mg/dL). °MAKE SURE YOU:  °· Understand these instructions. °· Will watch your condition. °· Will get help right away if you are not doing well or get worse. °Document Released: 01/04/2005 Document Revised: 07/06/2011 Document Reviewed: 04/15/2011 °[employer]® Patient Information ©2014 [employer]. ° °

## 2013-04-01 NOTE — Addendum Note (Signed)
Addended by: Roselee Culver on: 04/01/2013 07:01 PM   Modules accepted: Orders

## 2013-04-01 NOTE — Progress Notes (Signed)
[facility] [address], [place] [address] [phone]  Date:  04/01/2013   Name:  Joseph Tyler   DOB:  Apr 19, 1960   MRN:  [id number]  PCP:  Kennon Portela, MD    Chief Complaint: Follow-up   History of Present Illness:  JAYMON DUDEK is a 53 y.o. very pleasant male patient who presents with the following:  Ill all week and seen twice this week.  Found on ct to have pancreatitis.  Still having moderate pain that is not relieved by the vicodin.  No nausea or vomiting.  Eating normally.  No stool change.  No alcohol consumption.  No history of hepatobiliary disease.  No fever or chills.  Denies other complaint or health concern today.   Patient Active Problem List   Diagnosis Date Noted  . Tongue pain 06/08/2011  . ESSENTIAL HYPERTENSION, BENIGN 02/09/2008  . CHEST PAIN 02/09/2008  . ABNORMAL ELECTROCARDIOGRAM 02/09/2008    Past Medical History  Diagnosis Date  . HTN (hypertension)     on and off since 2002    History reviewed. No pertinent past surgical history.  History  Substance Use Topics  . Smoking status: Never Smoker   . Smokeless tobacco: Not on file  . Alcohol Use: No    No family history on file.  No Known Allergies  Medication list has been reviewed and updated.  Current Outpatient Prescriptions on File Prior to Visit  Medication Sig Dispense Refill  . ciprofloxacin (CIPRO) 500 MG tablet Take 1 tablet (500 mg total) by mouth 2 (two) times daily.  20 tablet  0  . HYDROcodone-acetaminophen (NORCO/VICODIN) 5-325 MG per tablet Take 1 tablet by mouth every 6 (six) hours as needed.  30 tablet  0  . lisinopril (PRINIVIL,ZESTRIL) 10 MG tablet Take 1 tablet (10 mg total) by mouth daily.  90 tablet  1   No current facility-administered medications on file prior to visit.    Review of Systems:  As per HPI, otherwise negative.    Physical Examination: Filed Vitals:   04/01/13 1811  BP: 128/80  Pulse: 68  Temp:  98.4 F (36.9 C)  Resp: 16   Filed Vitals:   04/01/13 1811  Height: 5\' 8"  (1.727 m)  Weight: 147 lb (66.679 kg)   Body mass index is 22.36 kg/(m^2). Ideal Body Weight: Weight in (lb) to have BMI = 25: 164.1  GEN: WDWN, NAD, Non-toxic, A & O x 3 HEENT: Atraumatic, Normocephalic. Neck supple. No masses, No LAD. Ears and Nose: No external deformity. CV: RRR, No M/G/R. No JVD. No thrill. No extra heart sounds. PULM: CTA B, no wheezes, crackles, rhonchi. No retractions. No resp. distress. No accessory muscle use. ABD: S, mild epigastric tenderness, ND, +BS. No rebound. No HSM. EXTR: No c/c/e NEURO Normal gait.  PSYCH: Normally interactive. Conversant. Not depressed or anxious appearing.  Calm demeanor.    Assessment and Plan: Pancreatitis Labs sono hepatobiliary tree GI consult  Signed,  Ellison Carwin, MD

## 2013-04-02 ENCOUNTER — Telehealth: Payer: Self-pay

## 2013-04-02 LAB — COMPREHENSIVE METABOLIC PANEL
ALBUMIN: 3.7 g/dL (ref 3.5–5.2)
ALT: 8 U/L (ref 0–53)
AST: 12 U/L (ref 0–37)
Alkaline Phosphatase: 90 U/L (ref 39–117)
BUN: 11 mg/dL (ref 6–23)
CO2: 26 mEq/L (ref 19–32)
CREATININE: 0.9 mg/dL (ref 0.50–1.35)
Calcium: 8.7 mg/dL (ref 8.4–10.5)
Chloride: 107 mEq/L (ref 96–112)
Glucose, Bld: 94 mg/dL (ref 70–99)
POTASSIUM: 4.1 meq/L (ref 3.5–5.3)
Sodium: 139 mEq/L (ref 135–145)
Total Bilirubin: 0.4 mg/dL (ref 0.2–1.2)
Total Protein: 5.9 g/dL — ABNORMAL LOW (ref 6.0–8.3)

## 2013-04-02 LAB — LIPASE: Lipase: 19 U/L (ref 0–75)

## 2013-04-02 LAB — AMYLASE: Amylase: 48 U/L (ref 0–105)

## 2013-04-02 NOTE — Telephone Encounter (Signed)
Pt is needing to talk with someone about getting a work note for today and tomorrow   Best number is [phone]

## 2013-04-02 NOTE — Telephone Encounter (Signed)
Spoke to pt, he is going to see the specialist. I advised him since dr guest wrote him to go back today, and he is going to see the specialist today, he can get the note from the specialist. He understood.

## 2013-04-03 NOTE — Telephone Encounter (Signed)
Reviewed chart- pt rtc 3/15 and saw Dr. Ouida Sills.

## 2013-04-26 ENCOUNTER — Encounter: Payer: Self-pay | Admitting: Internal Medicine

## 2013-09-26 ENCOUNTER — Other Ambulatory Visit: Payer: Self-pay

## 2013-09-26 MED ORDER — LISINOPRIL 10 MG PO TABS: 10.0000 mg | ORAL_TABLET | Freq: Every day | ORAL | Status: DC

## 2013-10-28 ENCOUNTER — Other Ambulatory Visit: Payer: Self-pay | Admitting: Family Medicine

## 2013-10-28 ENCOUNTER — Ambulatory Visit (INDEPENDENT_AMBULATORY_CARE_PROVIDER_SITE_OTHER): Payer: Managed Care, Other (non HMO) | Admitting: Family Medicine

## 2013-10-28 VITALS — BP 124/82 | HR 68 | Temp 98.1°F | Resp 16 | Ht 67.0 in | Wt 152.6 lb

## 2013-10-28 DIAGNOSIS — M791 Myalgia, unspecified site: Secondary | ICD-10-CM

## 2013-10-28 DIAGNOSIS — Z79899 Other long term (current) drug therapy: Secondary | ICD-10-CM

## 2013-10-28 DIAGNOSIS — D649 Anemia, unspecified: Secondary | ICD-10-CM

## 2013-10-28 DIAGNOSIS — E538 Deficiency of other specified B group vitamins: Secondary | ICD-10-CM

## 2013-10-28 DIAGNOSIS — M1990 Unspecified osteoarthritis, unspecified site: Secondary | ICD-10-CM

## 2013-10-28 DIAGNOSIS — I10 Essential (primary) hypertension: Secondary | ICD-10-CM

## 2013-10-28 DIAGNOSIS — R5382 Chronic fatigue, unspecified: Secondary | ICD-10-CM

## 2013-10-28 LAB — TSH: TSH: 1.163 u[IU]/mL (ref 0.350–4.500)

## 2013-10-28 LAB — POCT CBC
Granulocyte percent: 65.4 %G (ref 37–80)
HCT, POC: 41 % — AB (ref 43.5–53.7)
HEMOGLOBIN: 13.3 g/dL — AB (ref 14.1–18.1)
LYMPH, POC: 1.3 (ref 0.6–3.4)
MCH, POC: 27.7 pg (ref 27–31.2)
MCHC: 32.4 g/dL (ref 31.8–35.4)
MCV: 85.6 fL (ref 80–97)
MID (CBC): 0.3 (ref 0–0.9)
MPV: 7.6 fL (ref 0–99.8)
PLATELET COUNT, POC: 204 10*3/uL (ref 142–424)
POC GRANULOCYTE: 3 (ref 2–6.9)
POC LYMPH PERCENT: 28.8 %L (ref 10–50)
POC MID %: 5.8 % (ref 0–12)
RBC: 4.79 M/uL (ref 4.69–6.13)
RDW, POC: 13.7 %
WBC: 4.6 10*3/uL (ref 4.6–10.2)

## 2013-10-28 LAB — GLUCOSE, POCT (MANUAL RESULT ENTRY): POC GLUCOSE: 84 mg/dL (ref 70–99)

## 2013-10-28 LAB — SEDIMENTATION RATE: SED RATE: 12 mm/h (ref 0–16)

## 2013-10-28 MED ORDER — LISINOPRIL 10 MG PO TABS: 10.0000 mg | ORAL_TABLET | Freq: Every day | ORAL | Status: DC

## 2013-10-28 NOTE — Progress Notes (Addendum)
Subjective:    Patient ID: Joseph Tyler, male    DOB: 1960/12/15, 53 y.o.   MRN: [id number]  HPI Chief Complaint  Patient presents with  . Medication Refill    lisinopril   This chart was scribed for [name], MD by Thea Alken, ED Scribe. This patient was seen in room 11 and the patient's care was started at 9:38 AM.  HPI Comments: Joseph Tyler is a 53 y.o. male who presents to the [facility] complaining of medication refill regarding lisinopril. Pt last blood work 6 months ago. Pt is tolerating medication well. Pt has eaten today consisting of pancakes and tea. Pt denies CP SOB, HA, leg swelling, and visual changes. Pt denies taking cholesterol medication.   Pt also complains of muscle spasms, joint pain and fatigue. Pt takes tylenol and ibuprofen for the pain as needed.   Past Medical History  Diagnosis Date  . HTN (hypertension)     on and off since 2002   History reviewed. No pertinent past surgical history. Prior to Admission medications   Medication Sig Start Date End Date Taking? Authorizing Provider  lisinopril (PRINIVIL,ZESTRIL) 10 MG tablet Take 1 tablet (10 mg total) by mouth daily. PATIENT NEEDS OFFICE VISIT FOR ADDITIONAL REFILLS 09/26/13  Yes Chelle Janalee Dane, PA-C  HYDROcodone-acetaminophen (NORCO/VICODIN) 5-325 MG per tablet Take 1 tablet by mouth every 6 (six) hours as needed. 03/30/13   Orma Flaming, MD   Review of Systems  Constitutional: Positive for fatigue.  Eyes: Negative for visual disturbance.  Respiratory: Negative for chest tightness and shortness of breath.   Cardiovascular: Negative for chest pain, palpitations and leg swelling.  Musculoskeletal: Positive for arthralgias and myalgias.  Neurological: Negative for headaches.    Objective:   Physical Exam  Nursing note and vitals reviewed. Constitutional: He is oriented to person, place, and time. He appears well-developed and well-nourished. No distress.  HENT:    Head: Normocephalic and atraumatic.  Eyes: Conjunctivae and EOM are normal.  Neck: Neck supple. No thyromegaly present.  Cardiovascular: Normal rate, regular rhythm, S1 normal, S2 normal and normal heart sounds.  Exam reveals no gallop and no friction rub.   No murmur heard. Pulmonary/Chest: Effort normal and breath sounds normal. No respiratory distress. He has no wheezes. He has no rales. He exhibits no tenderness.  Musculoskeletal: Normal range of motion.  Full ROM in B knees but w/ severe creptius in Lt>Rt  Neurological: He is alert and oriented to person, place, and time.  Skin: Skin is warm and dry.  Psychiatric: He has a normal mood and affect. His behavior is normal.    Assessment & Plan:   Essential hypertension, benign - Plan: POCT CBC, POCT glucose (manual entry), CK, TSH, Sedimentation rate, Comprehensive metabolic panel - recommend fasting at next OV for lpids  Encounter for long-term (current) use of medications - Plan: POCT CBC, POCT glucose (manual entry), CK, TSH, Sedimentation rate, Comprehensive metabolic panel  Myalgia - Plan: POCT CBC, POCT glucose (manual entry), CK, TSH, Sedimentation rate, Comprehensive metabolic panel  Arthritis - Plan: POCT CBC, POCT glucose (manual entry), CK, TSH, Sedimentation rate, Comprehensive metabolic panel - advised glucosamine-chondroiton supp w prn tylenol arthritis  Chronic fatigue - Plan: POCT CBC, POCT glucose (manual entry), CK, TSH, Sedimentation rate, Comprehensive metabolic panel  H/o vit N82 deficiency - in 2013 B12 was borderline low at 270 and pt given IM B12 injection - recheck and start otc daily supplement - recheck  at f/u  Anemia - unknown etiology but stable. Check ferritin and b12 - needs referral for colonoscopy - rec pt sched CPE and address at f/u  Meds ordered this encounter  Medications  . lisinopril (PRINIVIL,ZESTRIL) 10 MG tablet    Sig: Take 1 tablet (10 mg total) by mouth daily.    Dispense:  90 tablet     Refill:  3    I personally performed the services described in this documentation, which was scribed in my presence. The recorded information has been reviewed and considered, and addended by me as needed.  [name], MD MPH  Results for orders placed in visit on 10/28/13  CK      Result Value Ref Range   Total CK 34  7 - 232 U/L  TSH      Result Value Ref Range   TSH 1.163  0.350 - 4.500 uIU/mL  SEDIMENTATION RATE      Result Value Ref Range   Sed Rate 12  0 - 16 mm/hr  COMPREHENSIVE METABOLIC PANEL      Result Value Ref Range   Sodium 138  135 - 145 mEq/L   Potassium 4.1  3.5 - 5.3 mEq/L   Chloride 106  96 - 112 mEq/L   CO2 28  19 - 32 mEq/L   Glucose, Bld 84  70 - 99 mg/dL   BUN 11  6 - 23 mg/dL   Creat 0.91  0.50 - 1.35 mg/dL   Total Bilirubin 0.6  0.2 - 1.2 mg/dL   Alkaline Phosphatase 112  39 - 117 U/L   AST 13  0 - 37 U/L   ALT 8  0 - 53 U/L   Total Protein 6.5  6.0 - 8.3 g/dL   Albumin 3.8  3.5 - 5.2 g/dL   Calcium 9.1  8.4 - 10.5 mg/dL  POCT CBC      Result Value Ref Range   WBC 4.6  4.6 - 10.2 K/uL   Lymph, poc 1.3  0.6 - 3.4   POC LYMPH PERCENT 28.8  10 - 50 %L   MID (cbc) 0.3  0 - 0.9   POC MID % 5.8  0 - 12 %M   POC Granulocyte 3.0  2 - 6.9   Granulocyte percent 65.4  37 - 80 %G   RBC 4.79  4.69 - 6.13 M/uL   Hemoglobin 13.3 (*) 14.1 - 18.1 g/dL   HCT, POC 41.0 (*) 43.5 - 53.7 %   MCV 85.6  80 - 97 fL   MCH, POC 27.7  27 - 31.2 pg   MCHC 32.4  31.8 - 35.4 g/dL   RDW, POC 13.7     Platelet Count, POC 204  142 - 424 K/uL   MPV 7.6  0 - 99.8 fL  GLUCOSE, POCT (MANUAL RESULT ENTRY)      Result Value Ref Range   POC Glucose 84  70 - 99 mg/dl

## 2013-10-28 NOTE — Patient Instructions (Signed)
Recommend trying a glucosamine-chondroiton supplement daily for your arthritis pain and try tylenol arthritis twice a day as needed as well.  We will send your lab results to you in about 2 wks if everything looks good but if we find a cause of your fatigue or pain we will call you.  At your visit next year - nothing to eat prior to your appointment so we can check your cholesterol - you can have water or black coffee/tea only.  Fatigue Fatigue is a feeling of tiredness, lack of energy, lack of motivation, or feeling tired all the time. Having enough rest, good nutrition, and reducing stress will normally reduce fatigue. Consult your caregiver if it persists. The nature of your fatigue will help your caregiver to find out its cause. The treatment is based on the cause.  CAUSES  There are many causes for fatigue. Most of the time, fatigue can be traced to one or more of your habits or routines. Most causes fit into one or more of three general areas. They are: Lifestyle problems  Sleep disturbances.  Overwork.  Physical exertion.  Unhealthy habits.  Poor eating habits or eating disorders.  Alcohol and/or drug use .  Lack of proper nutrition (malnutrition). Psychological problems  Stress and/or anxiety problems.  Depression.  Grief.  Boredom. Medical Problems or Conditions  Anemia.  Pregnancy.  Thyroid gland problems.  Recovery from major surgery.  Continuous pain.  Emphysema or asthma that is not well controlled  Allergic conditions.  Diabetes.  Infections (such as mononucleosis).  Obesity.  Sleep disorders, such as sleep apnea.  Heart failure or other heart-related problems.  Cancer.  Kidney disease.  Liver disease.  Effects of certain medicines such as antihistamines, cough and cold remedies, prescription pain medicines, heart and blood pressure medicines, drugs used for treatment of cancer, and some antidepressants. SYMPTOMS  The symptoms of fatigue  include:   Lack of energy.  Lack of drive (motivation).  Drowsiness.  Feeling of indifference to the surroundings. DIAGNOSIS  The details of how you feel help guide your caregiver in finding out what is causing the fatigue. You will be asked about your present and past health condition. It is important to review all medicines that you take, including prescription and non-prescription items. A thorough exam will be done. You will be questioned about your feelings, habits, and normal lifestyle. Your caregiver may suggest blood tests, urine tests, or other tests to look for common medical causes of fatigue.  TREATMENT  Fatigue is treated by correcting the underlying cause. For example, if you have continuous pain or depression, treating these causes will improve how you feel. Similarly, adjusting the dose of certain medicines will help in reducing fatigue.  HOME CARE INSTRUCTIONS   Try to get the required amount of good sleep every night.  Eat a healthy and nutritious diet, and drink enough water throughout the day.  Practice ways of relaxing (including yoga or meditation).  Exercise regularly.  Make plans to change situations that cause stress. Act on those plans so that stresses decrease over time. Keep your work and personal routine reasonable.  Avoid street drugs and minimize use of alcohol.  Start taking a daily multivitamin after consulting your caregiver. SEEK MEDICAL CARE IF:   You have persistent tiredness, which cannot be accounted for.  You have fever.  You have unintentional weight loss.  You have headaches.  You have disturbed sleep throughout the night.  You are feeling sad.  You have constipation.  You have dry skin.  You have gained weight.  You are taking any new or different medicines that you suspect are causing fatigue.  You are unable to sleep at night.  You develop any unusual swelling of your legs or other parts of your body. SEEK IMMEDIATE  MEDICAL CARE IF:   You are feeling confused.  Your vision is blurred.  You feel faint or pass out.  You develop severe headache.  You develop severe abdominal, pelvic, or back pain.  You develop chest pain, shortness of breath, or an irregular or fast heartbeat.  You are unable to pass a normal amount of urine.  You develop abnormal bleeding such as bleeding from the rectum or you vomit blood.  You have thoughts about harming yourself or committing suicide.  You are worried that you might harm someone else. MAKE SURE YOU:   Understand these instructions.  Will watch your condition.  Will get help right away if you are not doing well or get worse. Document Released: 11/01/2006 Document Revised: 03/29/2011 Document Reviewed: 05/08/2013 [facility] Patient Information 2015 [place], [place]. This information is not intended to replace advice given to you by your health care provider. Make sure you discuss any questions you have with your health care provider. Back Injury Prevention Back injuries can be extremely painful and difficult to heal. After having one back injury, you are much more likely to experience another later on. It is important to learn how to avoid injuring or re-injuring your back. The following tips can help you to prevent a back injury. PHYSICAL FITNESS  Exercise regularly and try to develop good tone in your abdominal muscles. Your abdominal muscles provide a lot of the support needed by your back.  Do aerobic exercises (walking, jogging, biking, swimming) regularly.  Do exercises that increase balance and strength (tai chi, yoga) regularly. This can decrease your risk of falling and injuring your back.  Stretch before and after exercising.  Maintain a healthy weight. The more you weigh, the more stress is placed on your back. For every pound of weight, 10 times that amount of pressure is placed on the back. DIET  Talk to your caregiver about how much calcium  and vitamin D you need per day. These nutrients help to prevent weakening of the bones (osteoporosis). Osteoporosis can cause broken (fractured) bones that lead to back pain.  Include good sources of calcium in your diet, such as dairy products, green, leafy vegetables, and products with calcium added (fortified).  Include good sources of vitamin D in your diet, such as milk and foods that are fortified with vitamin D.  Consider taking a nutritional supplement or a multivitamin if needed.  Stop smoking if you smoke. POSTURE  Sit and stand up straight. Avoid leaning forward when you sit or hunching over when you stand.  Choose chairs with good low back (lumbar) support.  If you work at a desk, sit close to your work so you do not need to lean over. Keep your chin tucked in. Keep your neck drawn back and elbows bent at a right angle. Your arms should look like the letter "L."  Sit high and close to the steering wheel when you drive. Add a lumbar support to your car seat if needed.  Avoid sitting or standing in one position for too long. Take breaks to get up, stretch, and walk around at least once every hour. Take breaks if you are driving for long periods of time.  Sleep  on your side with your knees slightly bent, or sleep on your back with a pillow under your knees. Do not sleep on your stomach. LIFTING, TWISTING, AND REACHING  Avoid heavy lifting, especially repetitive lifting. If you must do heavy lifting:  Stretch before lifting.  Work slowly.  Rest between lifts.  Use carts and dollies to move objects when possible.  Make several small trips instead of carrying 1 heavy load.  Ask for help when you need it.  Ask for help when moving big, awkward objects.  Follow these steps when lifting:  Stand with your feet shoulder-width apart.  Get as close to the object as you can. Do not try to pick up heavy objects that are far from your body.  Use handles or lifting straps if  they are available.  Bend at your knees. Squat down, but keep your heels off the floor.  Keep your shoulders pulled back, your chin tucked in, and your back straight.  Lift the object slowly, tightening the muscles in your legs, abdomen, and buttocks. Keep the object as close to the center of your body as possible.  When you put a load down, use these same guidelines in reverse.  Do not:  Lift the object above your waist.  Twist at the waist while lifting or carrying a load. Move your feet if you need to turn, not your waist.  Bend over without bending at your knees.  Avoid reaching over your head, across a table, or for an object on a high surface. OTHER TIPS  Avoid wet floors and keep sidewalks clear of ice to prevent falls.  Do not sleep on a mattress that is too soft or too hard.  Keep items that are used frequently within easy reach.  Put heavier objects on shelves at waist level and lighter objects on lower or higher shelves.  Find ways to decrease your stress, such as exercise, massage, or relaxation techniques. Stress can build up in your muscles. Tense muscles are more vulnerable to injury.  Seek treatment for depression or anxiety if needed. These conditions can increase your risk of developing back pain. SEEK MEDICAL CARE IF:  You injure your back.  You have questions about diet, exercise, or other ways to prevent back injuries. MAKE SURE YOU:  Understand these instructions.  Will watch your condition.  Will get help right away if you are not doing well or get worse. Document Released: 02/12/2004 Document Revised: 03/29/2011 Document Reviewed: 02/15/2011 [facility] Patient Information 2015 [place], [place]. This information is not intended to replace advice given to you by your health care provider. Make sure you discuss any questions you have with your health care provider. Back Pain, Adult Low back pain is very common. About 1 in 5 people have back pain.The  cause of low back pain is rarely dangerous. The pain often gets better over time.About half of people with a sudden onset of back pain feel better in just 2 weeks. About 8 in 10 people feel better by 6 weeks.  CAUSES Some common causes of back pain include:  Strain of the muscles or ligaments supporting the spine.  Wear and tear (degeneration) of the spinal discs.  Arthritis.  Direct injury to the back. DIAGNOSIS Most of the time, the direct cause of low back pain is not known.However, back pain can be treated effectively even when the exact cause of the pain is unknown.Answering your caregiver's questions about your overall health and symptoms is one of the  most accurate ways to make sure the cause of your pain is not dangerous. If your caregiver needs more information, he or she may order lab work or imaging tests (X-rays or MRIs).However, even if imaging tests show changes in your back, this usually does not require surgery. HOME CARE INSTRUCTIONS For many people, back pain returns.Since low back pain is rarely dangerous, it is often a condition that people can learn to [facility] their own.   Remain active. It is stressful on the back to sit or stand in one place. Do not sit, drive, or stand in one place for more than 30 minutes at a time. Take short walks on level surfaces as soon as pain allows.Try to increase the length of time you walk each day.  Do not stay in bed.Resting more than 1 or 2 days can delay your recovery.  Do not avoid exercise or work.Your body is made to move.It is not dangerous to be active, even though your back may hurt.Your back will likely heal faster if you return to being active before your pain is gone.  Pay attention to your body when you bend and lift. Many people have less discomfortwhen lifting if they bend their knees, keep the load close to their bodies,and avoid twisting. Often, the most comfortable positions are those that put less stress on  your recovering back.  Find a comfortable position to sleep. Use a firm mattress and lie on your side with your knees slightly bent. If you lie on your back, put a pillow under your knees.  Only take over-the-counter or prescription medicines as directed by your caregiver. Over-the-counter medicines to reduce pain and inflammation are often the most helpful.Your caregiver may prescribe muscle relaxant drugs.These medicines help dull your pain so you can more quickly return to your normal activities and healthy exercise.  Put ice on the injured area.  Put ice in a plastic bag.  Place a towel between your skin and the bag.  Leave the ice on for 15-20 minutes, 03-04 times a day for the first 2 to 3 days. After that, ice and heat may be alternated to reduce pain and spasms.  Ask your caregiver about trying back exercises and gentle massage. This may be of some benefit.  Avoid feeling anxious or stressed.Stress increases muscle tension and can worsen back pain.It is important to recognize when you are anxious or stressed and learn ways to manage it.Exercise is a great option. SEEK MEDICAL CARE IF:  You have pain that is not relieved with rest or medicine.  You have pain that does not improve in 1 week.  You have new symptoms.  You are generally not feeling well. SEEK IMMEDIATE MEDICAL CARE IF:   You have pain that radiates from your back into your legs.  You develop new bowel or bladder control problems.  You have unusual weakness or numbness in your arms or legs.  You develop nausea or vomiting.  You develop abdominal pain.  You feel faint. Document Released: 01/04/2005 Document Revised: 07/06/2011 Document Reviewed: 05/08/2013 [facility] Patient Information 2015 [place], [place]. This information is not intended to replace advice given to you by your health care provider. Make sure you discuss any questions you have with your health care provider.

## 2013-10-29 ENCOUNTER — Encounter: Payer: Self-pay | Admitting: Family Medicine

## 2013-10-29 DIAGNOSIS — D649 Anemia, unspecified: Secondary | ICD-10-CM | POA: Insufficient documentation

## 2013-10-29 DIAGNOSIS — E538 Deficiency of other specified B group vitamins: Secondary | ICD-10-CM | POA: Insufficient documentation

## 2013-10-29 DIAGNOSIS — D509 Iron deficiency anemia, unspecified: Secondary | ICD-10-CM | POA: Insufficient documentation

## 2013-10-29 LAB — COMPREHENSIVE METABOLIC PANEL
ALT: 8 U/L (ref 0–53)
AST: 13 U/L (ref 0–37)
Albumin: 3.8 g/dL (ref 3.5–5.2)
Alkaline Phosphatase: 112 U/L (ref 39–117)
BILIRUBIN TOTAL: 0.6 mg/dL (ref 0.2–1.2)
BUN: 11 mg/dL (ref 6–23)
CALCIUM: 9.1 mg/dL (ref 8.4–10.5)
CHLORIDE: 106 meq/L (ref 96–112)
CO2: 28 mEq/L (ref 19–32)
CREATININE: 0.91 mg/dL (ref 0.50–1.35)
GLUCOSE: 84 mg/dL (ref 70–99)
Potassium: 4.1 mEq/L (ref 3.5–5.3)
Sodium: 138 mEq/L (ref 135–145)
Total Protein: 6.5 g/dL (ref 6.0–8.3)

## 2013-10-29 LAB — CK: Total CK: 34 U/L (ref 7–232)

## 2013-10-30 LAB — FERRITIN: Ferritin: 72 ng/mL (ref 22–322)

## 2013-10-30 LAB — VITAMIN B12: Vitamin B-12: 322 pg/mL (ref 211–911)

## 2013-10-31 ENCOUNTER — Encounter: Payer: Self-pay | Admitting: Family Medicine

## 2013-11-19 ENCOUNTER — Emergency Department (HOSPITAL_COMMUNITY): Payer: Managed Care, Other (non HMO)

## 2013-11-19 ENCOUNTER — Emergency Department (HOSPITAL_COMMUNITY)
Admission: EM | Admit: 2013-11-19 | Discharge: 2013-11-20 | Disposition: A | Discharge status: Home / Self Care | Payer: Managed Care, Other (non HMO) | Attending: Emergency Medicine | Admitting: Emergency Medicine

## 2013-11-19 ENCOUNTER — Encounter (HOSPITAL_COMMUNITY): Payer: Self-pay | Admitting: Emergency Medicine

## 2013-11-19 DIAGNOSIS — R0789 Other chest pain: Secondary | ICD-10-CM | POA: Diagnosis not present

## 2013-11-19 DIAGNOSIS — R110 Nausea: Secondary | ICD-10-CM | POA: Insufficient documentation

## 2013-11-19 DIAGNOSIS — R1011 Right upper quadrant pain: Secondary | ICD-10-CM | POA: Diagnosis not present

## 2013-11-19 DIAGNOSIS — I10 Essential (primary) hypertension: Secondary | ICD-10-CM | POA: Insufficient documentation

## 2013-11-19 DIAGNOSIS — R109 Unspecified abdominal pain: Secondary | ICD-10-CM | POA: Diagnosis present

## 2013-11-19 DIAGNOSIS — R079 Chest pain, unspecified: Secondary | ICD-10-CM

## 2013-11-19 DIAGNOSIS — M545 Low back pain: Secondary | ICD-10-CM | POA: Insufficient documentation

## 2013-11-19 DIAGNOSIS — Z87442 Personal history of urinary calculi: Secondary | ICD-10-CM | POA: Insufficient documentation

## 2013-11-19 HISTORY — DX: Calculus of kidney: N20.0

## 2013-11-19 LAB — CBC WITH DIFFERENTIAL/PLATELET
Basophils Absolute: 0.1 10*3/uL (ref 0.0–0.1)
Basophils Relative: 1 % (ref 0–1)
EOS ABS: 0 10*3/uL (ref 0.0–0.7)
EOS PCT: 1 % (ref 0–5)
HEMATOCRIT: 40 % (ref 39.0–52.0)
Hemoglobin: 14.1 g/dL (ref 13.0–17.0)
LYMPHS PCT: 19 % (ref 12–46)
Lymphs Abs: 1.1 10*3/uL (ref 0.7–4.0)
MCH: 27.9 pg (ref 26.0–34.0)
MCHC: 35.3 g/dL (ref 30.0–36.0)
MCV: 79.2 fL (ref 78.0–100.0)
MONO ABS: 0.6 10*3/uL (ref 0.1–1.0)
Monocytes Relative: 10 % (ref 3–12)
Neutro Abs: 4.1 10*3/uL (ref 1.7–7.7)
Neutrophils Relative %: 69 % (ref 43–77)
PLATELETS: 238 10*3/uL (ref 150–400)
RBC: 5.05 MIL/uL (ref 4.22–5.81)
RDW: 12.6 % (ref 11.5–15.5)
WBC: 5.8 10*3/uL (ref 4.0–10.5)

## 2013-11-19 LAB — COMPREHENSIVE METABOLIC PANEL
ALT: 8 U/L (ref 0–53)
AST: 13 U/L (ref 0–37)
Albumin: 3.7 g/dL (ref 3.5–5.2)
Alkaline Phosphatase: 131 U/L — ABNORMAL HIGH (ref 39–117)
Anion gap: 13 (ref 5–15)
BUN: 6 mg/dL (ref 6–23)
CALCIUM: 9.7 mg/dL (ref 8.4–10.5)
CO2: 24 meq/L (ref 19–32)
CREATININE: 0.88 mg/dL (ref 0.50–1.35)
Chloride: 102 mEq/L (ref 96–112)
GFR calc non Af Amer: >90 mL/min (ref >90)
GLUCOSE: 113 mg/dL — AB (ref 70–99)
Potassium: 4.3 mEq/L (ref 3.7–5.3)
SODIUM: 139 meq/L (ref 137–147)
TOTAL PROTEIN: 7.2 g/dL (ref 6.0–8.3)
Total Bilirubin: 0.5 mg/dL (ref 0.3–1.2)

## 2013-11-19 LAB — URINALYSIS, ROUTINE W REFLEX MICROSCOPIC
Bilirubin Urine: NEGATIVE
Glucose, UA: NEGATIVE mg/dL
Ketones, ur: NEGATIVE mg/dL
Leukocytes, UA: NEGATIVE
Nitrite: NEGATIVE
PROTEIN: NEGATIVE mg/dL
Specific Gravity, Urine: 1.004 — ABNORMAL LOW (ref 1.005–1.030)
UROBILINOGEN UA: 0.2 mg/dL (ref 0.0–1.0)
pH: 6 (ref 5.0–8.0)

## 2013-11-19 LAB — URINE MICROSCOPIC-ADD ON

## 2013-11-19 LAB — D-DIMER, QUANTITATIVE (NOT AT ARMC)

## 2013-11-19 LAB — LIPASE, BLOOD: Lipase: 14 U/L (ref 11–59)

## 2013-11-19 LAB — TROPONIN I

## 2013-11-19 MED ORDER — FENTANYL CITRATE 0.05 MG/ML IJ SOLN
INTRAMUSCULAR | Status: AC
  Filled 2013-11-19: qty 2

## 2013-11-19 MED ORDER — OXYCODONE-ACETAMINOPHEN 5-325 MG PO TABS
1.0000 | ORAL_TABLET | Freq: Once | ORAL | Status: DC
  Filled 2013-11-19: qty 1

## 2013-11-19 MED ORDER — FENTANYL CITRATE 0.05 MG/ML IJ SOLN
50.0000 ug | Freq: Once | INTRAMUSCULAR | Status: AC
  Administered 2013-11-19: 50 ug via INTRAVENOUS

## 2013-11-19 MED ORDER — IOHEXOL 350 MG/ML SOLN
100.0000 mL | Freq: Once | INTRAVENOUS | Status: AC | PRN
  Administered 2013-11-19: 100 mL via INTRAVENOUS

## 2013-11-19 NOTE — ED Notes (Signed)
Per EMS: Pt c/o RLQ pain and right mid back pain x 3 days; worse with movement, inspiration and palpation

## 2013-11-19 NOTE — ED Provider Notes (Signed)
CSN: [id number]     Arrival date & time 11/19/13  1746 History   First MD Initiated Contact with Patient 11/19/13 2127     Chief Complaint  Patient presents with  . Flank Pain  . Back Pain     (Consider location/radiation/quality/duration/timing/severity/associated sxs/prior Treatment) HPI Comments: Patient presents with four-day history of right sided abdominal and back pain. He reports the pain is constant but comes in waves. It is worse with movement and palpation. Denies any nausea, vomiting, diarrhea, fever. Endorses several episodes of loose stools that are nonbloody. Has felt warm but not checked his temperature. Denies any dysuria or hematuria. Denies any testicular pain. Pain is in his right sided low to mid back and radiates around his upper abdomen and lower chest. Denies any exertional chest pain. Pain is worse with palpation and movement. States he was told he had an inflamed pancreas and a kidney stone several years ago and this feels similar. Denies any alcohol use. No previous abdominal surgeries  The history is provided by the patient and a relative. The history is limited by the condition of the patient.    Past Medical History  Diagnosis Date  . HTN (hypertension)     on and off since 2002  . Kidney stone    History reviewed. No pertinent past surgical history. History reviewed. No pertinent family history. History  Substance Use Topics  . Smoking status: Never Smoker   . Smokeless tobacco: Not on file  . Alcohol Use: No    Review of Systems  Constitutional: Negative for fever, activity change and appetite change.  Respiratory: Negative for cough and chest tightness.   Cardiovascular: Positive for chest pain.  Gastrointestinal: Positive for nausea and abdominal pain. Negative for vomiting.  Genitourinary: Negative for dysuria and hematuria.  Musculoskeletal: Positive for back pain. Negative for myalgias, arthralgias, neck pain and neck stiffness.  Skin:  Negative for rash.  Neurological: Negative for dizziness, weakness and headaches.  A complete 10 system review of systems was obtained and all systems are negative except as noted in the HPI and PMH.      Allergies  Other  Home Medications   Prior to Admission medications   Medication Sig Start Date End Date Taking? Authorizing Provider  lisinopril (PRINIVIL,ZESTRIL) 10 MG tablet Take 1 tablet (10 mg total) by mouth daily. 10/28/13  Yes Shawnee Knapp, MD  HYDROcodone-acetaminophen (NORCO/VICODIN) 5-325 MG per tablet Take 2 tablets by mouth every 4 (four) hours as needed. 11/20/13   Ezequiel Essex, MD  ondansetron (ZOFRAN) 4 MG tablet Take 1 tablet (4 mg total) by mouth every 6 (six) hours. 11/20/13   Ezequiel Essex, MD   BP 126/90 mmHg  Pulse 67  Temp(Src) 98.2 F (36.8 C) (Oral)  Resp 17  Ht 5\' 7"  (1.702 m)  Wt 150 lb (68.04 kg)  BMI 23.49 kg/m2  SpO2 99% Physical Exam  Constitutional: He is oriented to person, place, and time. He appears well-developed and well-nourished. No distress.  HENT:  Head: Normocephalic and atraumatic.  Mouth/Throat: Oropharynx is clear and moist. No oropharyngeal exudate.  Eyes: Conjunctivae and EOM are normal. Pupils are equal, round, and reactive to light.  Neck: Normal range of motion. Neck supple.  No meningismus.  Cardiovascular: Normal rate, regular rhythm, normal heart sounds and intact distal pulses.   No murmur heard. Pulmonary/Chest: Effort normal and breath sounds normal. No respiratory distress.  Abdominal: Soft. There is tenderness. There is no rebound and no guarding.  TTP  RUQ and R lower ribs.  No rash.  No guarding or rebound  Musculoskeletal: Normal range of motion. He exhibits tenderness. He exhibits no edema.  TTP R paraspinal lumbar   Neurological: He is alert and oriented to person, place, and time. No cranial nerve deficit. He exhibits normal muscle tone. Coordination normal.  No ataxia on finger to nose bilaterally. No  pronator drift. 5/5 strength throughout. CN 2-12 intact. Negative Romberg. Equal grip strength. Sensation intact. Gait is normal.   5/5 strength in bilateral lower extremities. Ankle plantar and dorsiflexion intact. Great toe extension intact bilaterally. +2 DP and PT pulses. +2 patellar reflexes bilaterally. Normal gait.   Skin: Skin is warm.  Psychiatric: He has a normal mood and affect. His behavior is normal.  Nursing note and vitals reviewed.   ED Course  Procedures (including critical care time) Labs Review Labs Reviewed  COMPREHENSIVE METABOLIC PANEL - Abnormal; Notable for the following:    Glucose, Bld 113 (*)    Alkaline Phosphatase 131 (*)    All other components within normal limits  URINALYSIS, ROUTINE W REFLEX MICROSCOPIC - Abnormal; Notable for the following:    Specific Gravity, Urine 1.004 (*)    Hgb urine dipstick MODERATE (*)    All other components within normal limits  CBC WITH DIFFERENTIAL  URINE MICROSCOPIC-ADD ON  TROPONIN I  LIPASE, BLOOD  D-DIMER, QUANTITATIVE  TROPONIN I    Imaging Review Dg Chest 2 View  11/19/2013   CLINICAL DATA:  Right-sided chest pain, epigastric pain, hypertension, nausea, diarrhea, and fever for 3 days.  EXAM: CHEST  2 VIEW  COMPARISON:  03/26/2013  FINDINGS: Mild hyperinflation. Normal heart size and pulmonary vascularity. No focal airspace disease or consolidation in the lungs. No blunting of costophrenic angles. No pneumothorax. Mediastinal contours appear intact. Multiple old right rib fractures. Mild thoracic curvature convex towards the right.  IMPRESSION: No active cardiopulmonary disease.   Electronically Signed   By: Lucienne Capers M.D.   On: 11/19/2013 22:57   Ct Angio Chest Aorta W/cm &/or Wo/cm  11/19/2013   CLINICAL DATA:  Right-sided chest pain and abdominal and back pain for 3 days.  EXAM: CT ANGIOGRAPHY CHEST, ABDOMEN AND PELVIS  TECHNIQUE: Multidetector CT imaging through the chest, abdomen and pelvis was  performed using the standard protocol during bolus administration of intravenous contrast. Multiplanar reconstructed images and MIPs were obtained and reviewed to evaluate the vascular anatomy.  CONTRAST:  173mL OMNIPAQUE IOHEXOL 350 MG/ML SOLN  COMPARISON:  None.  FINDINGS: CTA CHEST FINDINGS  The thoracic aorta is normal. No aneurysm or dissection. The branch vessels are patent. The pulmonary arteries are normal. The heart is normal in size. No pericardial effusion. No mediastinal or hilar mass or adenopathy.  The chest wall is unremarkable. Moderate scoliosis. Remote healed right-sided rib fractures are noted.  Review of the MIP images confirms the above findings.  CTA ABDOMEN AND PELVIS FINDINGS  The abdominal aorta is normal.  The branch vessels are normal.  The solid abdominal organs are unremarkable. Gallbladder is normal. No common bowel duct dilatation.  The stomach, duodenum, small bowel and colon are grossly normal without oral contrast. No obvious inflammatory changes, mass lesions or obstructive findings.  No mesenteric or retroperitoneal mass or adenopathy.  The bladder, prostate gland and seminal vesicles are unremarkable. No pelvic mass, adenopathy or free pelvic fluid collections. No inguinal mass or hernia.  The bony structures are unremarkable.  Review of the MIP images confirms the above findings.  IMPRESSION: Unremarkable CTA of the chest, abdomen and pelvis.   Electronically Signed   By: Kalman Jewels M.D.   On: 11/19/2013 22:56   Ct Cta Abd/pel W/cm &/or W/o Cm  11/19/2013   CLINICAL DATA:  Right-sided chest pain and abdominal and back pain for 3 days.  EXAM: CT ANGIOGRAPHY CHEST, ABDOMEN AND PELVIS  TECHNIQUE: Multidetector CT imaging through the chest, abdomen and pelvis was performed using the standard protocol during bolus administration of intravenous contrast. Multiplanar reconstructed images and MIPs were obtained and reviewed to evaluate the vascular anatomy.  CONTRAST:  126mL  OMNIPAQUE IOHEXOL 350 MG/ML SOLN  COMPARISON:  None.  FINDINGS: CTA CHEST FINDINGS  The thoracic aorta is normal. No aneurysm or dissection. The branch vessels are patent. The pulmonary arteries are normal. The heart is normal in size. No pericardial effusion. No mediastinal or hilar mass or adenopathy.  The chest wall is unremarkable. Moderate scoliosis. Remote healed right-sided rib fractures are noted.  Review of the MIP images confirms the above findings.  CTA ABDOMEN AND PELVIS FINDINGS  The abdominal aorta is normal.  The branch vessels are normal.  The solid abdominal organs are unremarkable. Gallbladder is normal. No common bowel duct dilatation.  The stomach, duodenum, small bowel and colon are grossly normal without oral contrast. No obvious inflammatory changes, mass lesions or obstructive findings.  No mesenteric or retroperitoneal mass or adenopathy.  The bladder, prostate gland and seminal vesicles are unremarkable. No pelvic mass, adenopathy or free pelvic fluid collections. No inguinal mass or hernia.  The bony structures are unremarkable.  Review of the MIP images confirms the above findings.  IMPRESSION: Unremarkable CTA of the chest, abdomen and pelvis.   Electronically Signed   By: Kalman Jewels M.D.   On: 11/19/2013 22:56   US Abdomen Limited Ruq  11/19/2013   CLINICAL DATA:  Abdominal pain. Moderate hematuria. Elevated alkaline phosphatase.  EXAM: US ABDOMEN LIMITED - RIGHT UPPER QUADRANT  COMPARISON:  None.  FINDINGS: Gallbladder:  No gallstones or wall thickening visualized. No sonographic Murphy sign noted.  Common bile duct:  Diameter: 6.6 mm, normal  Liver:  No focal lesion identified. Within normal limits in parenchymal echogenicity.  IMPRESSION: No evidence of cholelithiasis or acute cholecystitis.   Electronically Signed   By: Lucienne Capers M.D.   On: 11/19/2013 23:26     EKG Interpretation   Date/Time:  Tuesday November 20 2013 00:07:09 EST Ventricular Rate:  69 PR  Interval:  121 QRS Duration: 84 QT Interval:  376 QTC Calculation: 403 R Axis:   56 Text Interpretation:  Sinus rhythm Abnormal R-wave progression, early  transition Consider left ventricular hypertrophy No significant change was  found Confirmed by Wyvonnia Dusky  MD, Kendon Sedeno ([id number]) on 11/20/2013 12:10:30 AM      MDM   Final diagnoses:  Abdominal pain  Chest pain  abdominal and back pain with nausea. No peritoneal signs on exam. Intact distal pulses. EKG normal sinus rhythm with PVCs. UA with hematuria which patient states is a chronic problem.  EKG nsr.  LFTs and lipase normal. CTA with no evidence of dissection or PE.  No other intraabdominal pathology. Pancreas appears normal.  [place] with normal appearing gallbladder.  Troponin negative x2. Doubt ACS. Pain improved.  Uncertain etiology of pain. Possibly passed kidney stone. Consider zoster with rash not yet visible.  Control symptoms and followup with PCP tomorrow as scheduled. Return precautions discussed.    Ezequiel Essex, MD 11/20/13 0201

## 2013-11-20 ENCOUNTER — Ambulatory Visit (INDEPENDENT_AMBULATORY_CARE_PROVIDER_SITE_OTHER): Payer: Managed Care, Other (non HMO) | Admitting: Internal Medicine

## 2013-11-20 VITALS — BP 100/72 | HR 72 | Temp 98.3°F | Resp 18 | Ht 68.0 in | Wt 145.0 lb

## 2013-11-20 DIAGNOSIS — K5900 Constipation, unspecified: Secondary | ICD-10-CM

## 2013-11-20 DIAGNOSIS — E538 Deficiency of other specified B group vitamins: Secondary | ICD-10-CM

## 2013-11-20 DIAGNOSIS — R634 Abnormal weight loss: Secondary | ICD-10-CM

## 2013-11-20 DIAGNOSIS — R1011 Right upper quadrant pain: Secondary | ICD-10-CM

## 2013-11-20 DIAGNOSIS — Z125 Encounter for screening for malignant neoplasm of prostate: Secondary | ICD-10-CM

## 2013-11-20 DIAGNOSIS — Z8719 Personal history of other diseases of the digestive system: Secondary | ICD-10-CM

## 2013-11-20 DIAGNOSIS — R5383 Other fatigue: Secondary | ICD-10-CM

## 2013-11-20 LAB — TROPONIN I: Troponin I: <0.3 ng/mL (ref <0.30)

## 2013-11-20 MED ORDER — HYDROCODONE-ACETAMINOPHEN 5-325 MG PO TABS: 2.0000 | ORAL_TABLET | ORAL | Status: DC | PRN

## 2013-11-20 MED ORDER — ONDANSETRON HCL 4 MG PO TABS: 4.0000 mg | ORAL_TABLET | Freq: Four times a day (QID) | ORAL | Status: DC

## 2013-11-20 MED ORDER — OMEPRAZOLE 40 MG PO CPDR: 40.0000 mg | DELAYED_RELEASE_CAPSULE | Freq: Every day | ORAL | Status: DC

## 2013-11-20 MED ORDER — CYANOCOBALAMIN 1000 MCG/ML IJ SOLN
1000.0000 ug | Freq: Once | INTRAMUSCULAR | Status: AC
  Administered 2013-11-20: 1000 ug via INTRAMUSCULAR

## 2013-11-20 NOTE — Progress Notes (Signed)
   Subjective:    Patient ID: Joseph Tyler, male    DOB: September 20, 1960, 53 y.o.   MRN: [id number]  HPI 53 year old male here for abdominal pain and thoracic pain. He has had the pain for five days. This past Thursday night he started having pain in his RUQ and mid thoracic pain. Pt says the pain "is moving around." Thursday night he felt bloated. He has been able to eat a little bit, but it causes discomfort when he eats. He gets nauseous every time he eats. He has had diarrhea off and on. No emesis. He says the pain is a sharp stabbing pain. The pain will wake him up in the middle of the night. Has not tried any OTC medicines. Pt had pancreatitis in March. Hx B12 def. No fever Pt states severe fatigue No chest pain; No SOB Off and on constipation. No issues with constipation before he starting feeling ill. Says he drinks milk to help with his constipation.  No blood in stool.    Did have new mild elevation of alkaline phosphatase,see ER work up Pt was seen in the ED last night. He had multiple labs done which were all normal. His WBC was normal. He had an abdominal US and CXR; both were negative for any acute disease. Pt is still concerned about the pain even though all his tests in the ED were normal last night.  CT angio chest and abdomen pelvis all normal.  Review of Systems  Constitutional: Positive for appetite change and fatigue. Negative for fever and chills.  Respiratory: Negative for shortness of breath.   Cardiovascular: Negative for chest pain.  Gastrointestinal: Positive for nausea, abdominal pain, diarrhea and constipation. Negative for vomiting and blood in stool.       Objective:   Physical Exam  Constitutional: He is oriented to person, place, and time. No distress.  HENT:  Head: Normocephalic.  Mouth/Throat: Oropharynx is clear and moist.  Eyes: EOM are normal. No scleral icterus.  Neck: Normal range of motion. Neck supple.  Cardiovascular: Normal rate.     Pulmonary/Chest: Effort normal and breath sounds normal.  Abdominal: Soft. Bowel sounds are normal. There is no tenderness. There is no rebound and no guarding.  Neurological: He is alert and oriented to person, place, and time. He exhibits normal muscle tone. Coordination normal.  Psychiatric: He has a normal mood and affect. His behavior is normal.    B12, PSA      Assessment & Plan:  B12 1cc IM Omeprazole 40mg  qd possible GERD Refer to GI for endoscopy Needs CPE soonb BP is low/hold lisinopril

## 2013-11-20 NOTE — Patient Instructions (Addendum)
You can buy Miralax over the counter. Drink one glass in the morning and one at night.   Constipation Constipation is when a person has fewer than three bowel movements a week, has difficulty having a bowel movement, or has stools that are dry, hard, or larger than normal. As people grow older, constipation is more common. If you try to fix constipation with medicines that make you have a bowel movement (laxatives), the problem may get worse. Long-term laxative use may cause the muscles of the colon to become weak. A low-fiber diet, not taking in enough fluids, and taking certain medicines may make constipation worse.  CAUSES   Certain medicines, such as antidepressants, pain medicine, iron supplements, antacids, and water pills.   Certain diseases, such as diabetes, irritable bowel syndrome (IBS), thyroid disease, or depression.   Not drinking enough water.   Not eating enough fiber-rich foods.   Stress or travel.   Lack of physical activity or exercise.   Ignoring the urge to have a bowel movement.   Using laxatives too much.  SIGNS AND SYMPTOMS   Having fewer than three bowel movements a week.   Straining to have a bowel movement.   Having stools that are hard, dry, or larger than normal.   Feeling full or bloated.   Pain in the lower abdomen.   Not feeling relief after having a bowel movement.  DIAGNOSIS  Your health care provider will take a medical history and perform a physical exam. Further testing may be done for severe constipation. Some tests may include:  A barium enema X-ray to examine your rectum, colon, and, sometimes, your small intestine.   A sigmoidoscopy to examine your lower colon.   A colonoscopy to examine your entire colon. TREATMENT  Treatment will depend on the severity of your constipation and what is causing it. Some dietary treatments include drinking more fluids and eating more fiber-rich foods. Lifestyle treatments may include  regular exercise. If these diet and lifestyle recommendations do not help, your health care provider may recommend taking over-the-counter laxative medicines to help you have bowel movements. Prescription medicines may be prescribed if over-the-counter medicines do not work.  HOME CARE INSTRUCTIONS   Eat foods that have a lot of fiber, such as fruits, vegetables, whole grains, and beans.  Limit foods high in fat and processed sugars, such as french fries, hamburgers, cookies, candies, and soda.   A fiber supplement may be added to your diet if you cannot get enough fiber from foods.   Drink enough fluids to keep your urine clear or pale yellow.   Exercise regularly or as directed by your health care provider.   Go to the restroom when you have the urge to go. Do not hold it.   Only take over-the-counter or prescription medicines as directed by your health care provider. Do not take other medicines for constipation without talking to your health care provider first.  [place] IF:   You have bright red blood in your stool.   Your constipation lasts for more than 4 days or gets worse.   You have abdominal or rectal pain.   You have thin, pencil-like stools.   You have unexplained weight loss. MAKE SURE YOU:   Understand these instructions.  Will watch your condition.  Will get help right away if you are not doing well or get worse. Document Released: 10/03/2003 Document Revised: 01/09/2013 Document Reviewed: 10/16/2012 [facility] Patient Information 2015 [place], [place]. This information is  not intended to replace advice given to you by your health care provider. Make sure you discuss any questions you have with your health care provider. Gastroesophageal Reflux Disease, Adult Gastroesophageal reflux disease (GERD) happens when acid from your stomach flows up into the esophagus. When acid comes in contact with the esophagus, the acid causes soreness  (inflammation) in the esophagus. Over time, GERD may create small holes (ulcers) in the lining of the esophagus. CAUSES   Increased body weight. This puts pressure on the stomach, making acid rise from the stomach into the esophagus.  Smoking. This increases acid production in the stomach.  Drinking alcohol. This causes decreased pressure in the lower esophageal sphincter (valve or ring of muscle between the esophagus and stomach), allowing acid from the stomach into the esophagus.  Late evening meals and a full stomach. This increases pressure and acid production in the stomach.  A malformed lower esophageal sphincter. Sometimes, no cause is found. SYMPTOMS   Burning pain in the lower part of the mid-chest behind the breastbone and in the mid-stomach area. This may occur twice a week or more often.  Trouble swallowing.  Sore throat.  Dry cough.  Asthma-like symptoms including chest tightness, shortness of breath, or wheezing. DIAGNOSIS  Your caregiver may be able to diagnose GERD based on your symptoms. In some cases, X-rays and other tests may be done to check for complications or to check the condition of your stomach and esophagus. TREATMENT  Your caregiver may recommend over-the-counter or prescription medicines to help decrease acid production. Ask your caregiver before starting or adding any new medicines.  HOME CARE INSTRUCTIONS   Change the factors that you can control. Ask your caregiver for guidance concerning weight loss, quitting smoking, and alcohol consumption.  Avoid foods and drinks that make your symptoms worse, such as:  Caffeine or alcoholic drinks.  Chocolate.  Peppermint or mint flavorings.  Garlic and onions.  Spicy foods.  Citrus fruits, such as oranges, lemons, or limes.  Tomato-based foods such as sauce, chili, salsa, and pizza.  Fried and fatty foods.  Avoid lying down for the 3 hours prior to your bedtime or prior to taking a nap.  Eat  small, frequent meals instead of large meals.  Wear loose-fitting clothing. Do not wear anything tight around your waist that causes pressure on your stomach.  Raise the head of your bed 6 to 8 inches with wood blocks to help you sleep. Extra pillows will not help.  Only take over-the-counter or prescription medicines for pain, discomfort, or fever as directed by your caregiver.  Do not take aspirin, ibuprofen, or other nonsteroidal anti-inflammatory drugs (NSAIDs). SEEK IMMEDIATE MEDICAL CARE IF:   You have pain in your arms, neck, jaw, teeth, or back.  Your pain increases or changes in intensity or duration.  You develop nausea, vomiting, or sweating (diaphoresis).  You develop shortness of breath, or you faint.  Your vomit is green, yellow, black, or looks like coffee grounds or blood.  Your stool is red, bloody, or black. These symptoms could be signs of other problems, such as heart disease, gastric bleeding, or esophageal bleeding. MAKE SURE YOU:   Understand these instructions.  Will watch your condition.  Will get help right away if you are not doing well or get worse. Document Released: 10/14/2004 Document Revised: 03/29/2011 Document Reviewed: 07/24/2010 [facility] Patient Information 2015 [place], [place]. This information is not intended to replace advice given to you by your health care provider. Make sure  you discuss any questions you have with your health care provider.

## 2013-11-20 NOTE — Discharge Instructions (Signed)
Abdominal Pain Your testing does not show a serious cause of his symptoms. Take the medication as prescribed and follow-up with your doctor tomorrow. Return to the ED if you develop new or worsening symptoms. Many things can cause abdominal pain. Usually, abdominal pain is not caused by a disease and will improve without treatment. It can often be observed and treated at home. Your health care provider will do a physical exam and possibly order blood tests and X-rays to help determine the seriousness of your pain. However, in many cases, more time must pass before a clear cause of the pain can be found. Before that point, your health care provider may not know if you need more testing or further treatment. HOME CARE INSTRUCTIONS  Monitor your abdominal pain for any changes. The following actions may help to alleviate any discomfort you are experiencing:  Only take over-the-counter or prescription medicines as directed by your health care provider.  Do not take laxatives unless directed to do so by your health care provider.  Try a clear liquid diet (broth, tea, or water) as directed by your health care provider. Slowly move to a bland diet as tolerated. SEEK MEDICAL CARE IF:  You have unexplained abdominal pain.  You have abdominal pain associated with nausea or diarrhea.  You have pain when you urinate or have a bowel movement.  You experience abdominal pain that wakes you in the night.  You have abdominal pain that is worsened or improved by eating food.  You have abdominal pain that is worsened with eating fatty foods.  You have a fever. SEEK IMMEDIATE MEDICAL CARE IF:   Your pain does not go away within 2 hours.  You keep throwing up (vomiting).  Your pain is felt only in portions of the abdomen, such as the right side or the left lower portion of the abdomen.  You pass bloody or black tarry stools. MAKE SURE YOU:  Understand these instructions.   Will watch your  condition.   Will get help right away if you are not doing well or get worse.  Document Released: 10/14/2004 Document Revised: 01/09/2013 Document Reviewed: 09/13/2012 [facility] Patient Information 2015 [place], [place]. This information is not intended to replace advice given to you by your health care provider. Make sure you discuss any questions you have with your health care provider.

## 2013-11-21 LAB — VITAMIN B12: VITAMIN B 12: 330 pg/mL (ref 211–911)

## 2013-11-21 LAB — PSA: PSA: 0.96 ng/mL (ref <=4.00)

## 2013-11-22 ENCOUNTER — Telehealth: Payer: Self-pay | Admitting: Internal Medicine

## 2013-11-22 ENCOUNTER — Encounter: Payer: Self-pay | Admitting: Radiology

## 2013-11-22 NOTE — Telephone Encounter (Signed)
Pt called in again and is requesting a work note for 11/3 and 11/4. He would like someone to call him when it is ready today. He will be by to pick it up before 6. [phone].Thank you

## 2013-11-22 NOTE — Telephone Encounter (Signed)
Made note. Will put in pick up box.

## 2013-11-22 NOTE — Telephone Encounter (Signed)
Patient needs a note for being out of work on Tuesday and Wednesday. Patient is a little concerned because he was supposed to have a referral but was told they cannot find a specialist at the moment (verify accuracy). Patient fears having to be out of work for a longer period of time now.   [phone]

## 2014-05-01 ENCOUNTER — Ambulatory Visit (INDEPENDENT_AMBULATORY_CARE_PROVIDER_SITE_OTHER): Payer: Managed Care, Other (non HMO) | Admitting: Family Medicine

## 2014-05-01 VITALS — BP 110/88 | HR 79 | Temp 97.8°F | Resp 18 | Ht 67.0 in | Wt 150.8 lb

## 2014-05-01 DIAGNOSIS — R5383 Other fatigue: Secondary | ICD-10-CM | POA: Diagnosis not present

## 2014-05-01 DIAGNOSIS — Z862 Personal history of diseases of the blood and blood-forming organs and certain disorders involving the immune mechanism: Secondary | ICD-10-CM | POA: Diagnosis not present

## 2014-05-01 DIAGNOSIS — I10 Essential (primary) hypertension: Secondary | ICD-10-CM

## 2014-05-01 DIAGNOSIS — E538 Deficiency of other specified B group vitamins: Secondary | ICD-10-CM | POA: Diagnosis not present

## 2014-05-01 DIAGNOSIS — Z0000 Encounter for general adult medical examination without abnormal findings: Secondary | ICD-10-CM

## 2014-05-01 DIAGNOSIS — M791 Myalgia, unspecified site: Secondary | ICD-10-CM | POA: Insufficient documentation

## 2014-05-01 LAB — LIPID PANEL
Cholesterol: 118 mg/dL (ref 0–200)
HDL: 40 mg/dL (ref >=40)
LDL Cholesterol: 69 mg/dL (ref 0–99)
Total CHOL/HDL Ratio: 3 Ratio
Triglycerides: 46 mg/dL (ref <150)
VLDL: 9 mg/dL (ref 0–40)

## 2014-05-01 LAB — COMPREHENSIVE METABOLIC PANEL
ALT: 8 U/L (ref 0–53)
AST: 12 U/L (ref 0–37)
Albumin: 3.5 g/dL (ref 3.5–5.2)
Alkaline Phosphatase: 104 U/L (ref 39–117)
BILIRUBIN TOTAL: 0.5 mg/dL (ref 0.2–1.2)
BUN: 10 mg/dL (ref 6–23)
CO2: 25 mEq/L (ref 19–32)
CREATININE: 0.82 mg/dL (ref 0.50–1.35)
Calcium: 8.8 mg/dL (ref 8.4–10.5)
Chloride: 107 mEq/L (ref 96–112)
Glucose, Bld: 87 mg/dL (ref 70–99)
Potassium: 4.1 mEq/L (ref 3.5–5.3)
Sodium: 139 mEq/L (ref 135–145)
Total Protein: 6.2 g/dL (ref 6.0–8.3)

## 2014-05-01 LAB — POCT CBC
Granulocyte percent: 60 %G (ref 37–80)
HCT, POC: 39.4 % — AB (ref 43.5–53.7)
Hemoglobin: 12.7 g/dL — AB (ref 14.1–18.1)
LYMPH, POC: 1.4 (ref 0.6–3.4)
MCH: 26.6 pg — AB (ref 27–31.2)
MCHC: 32.2 g/dL (ref 31.8–35.4)
MCV: 82.7 fL (ref 80–97)
MID (CBC): 0.4 (ref 0–0.9)
MPV: 6.7 fL (ref 0–99.8)
PLATELET COUNT, POC: 228 10*3/uL (ref 142–424)
POC Granulocyte: 2.7 (ref 2–6.9)
POC LYMPH PERCENT: 31.2 %L (ref 10–50)
POC MID %: 8.8 %M (ref 0–12)
RBC: 4.76 M/uL (ref 4.69–6.13)
RDW, POC: 13.7 %
WBC: 4.5 10*3/uL — AB (ref 4.6–10.2)

## 2014-05-01 LAB — POCT GLYCOSYLATED HEMOGLOBIN (HGB A1C): Hemoglobin A1C: 5.2

## 2014-05-01 LAB — CK: Total CK: 31 U/L (ref 7–232)

## 2014-05-01 LAB — POCT SEDIMENTATION RATE: POCT SED RATE: 28 mm/hr — AB (ref 0–22)

## 2014-05-01 LAB — TSH: TSH: 0.993 u[IU]/mL (ref 0.350–4.500)

## 2014-05-01 LAB — VITAMIN B12: VITAMIN B 12: 324 pg/mL (ref 211–911)

## 2014-05-01 MED ORDER — CYANOCOBALAMIN 1000 MCG/ML IJ SOLN
1000.0000 ug | INTRAMUSCULAR | Status: DC
  Administered 2014-05-01: 1000 ug via INTRAMUSCULAR

## 2014-05-01 MED ORDER — LISINOPRIL 10 MG PO TABS: 10.0000 mg | ORAL_TABLET | Freq: Every day | ORAL | Status: DC

## 2014-05-01 NOTE — Progress Notes (Signed)
Annual physical exam  History: 54 year old [place] American man who is here for a physical exam. He has been followed here in the past by Dr. Elder Cyphers, and now is asking to see me. He continues to have some chronic problems with fatigue and inability to gain weight and poor appetite. He wants to be checked over.  Past medical history: Illnesses: Hypertension History of sickle cell trait History of anemia and B12 deficiency History of pancreatitis Surgeries: No surgeries. Has had colonoscopy Regular medications: Lisinopril Medication allergies: None known  Family history: Mother is deceased from heart disease. Father is deceased, had high cholesterol.  Social history: Patient was raised in [place] in [place]. He has been in the [place] for over 20 years. 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 He is married and has 4 children all in the [place]. He does not do a lot of regular exercise because of his chronic pain in his legs. He does have a history of a disc problem in his back, treated with an injection. He does not smoke or drink or use illicit drugs. He is Islamic.  Review of systems: Constitutional: Not able to do a lot of exercise due to his legs. His appetite is not good, really doesn't have taste for food. He is fatigued a lot of the times. He does have a history of dental issues, otherwise HEENT is unremarkable. Respiratory: Unremarkable. Cardiovascular: History of PVCs but no current acute problems Gastrointestinal: Unremarkable Endocrine: Unremarkable Genitourinary: Unremarkable Muscular skeletal: Joint pains back pain and muscle aches neck pain and neck stiffness. Stress aggravates all that for him. Dermatologic: Rash intermittent histaminic type reactions Allergies: Seasonal allergies Neurological: Has headaches and numbness sensation in his legs. Hematologic: Unremarkable except for he does have a history of sickle cell  trait and anemia Psychiatric: Some anxiety. He worries about them use.  Physical examination: Pleasant alert fully oriented gentleman in no major distress. TMs normal. Eyes PERRLA. Throat clear. Neck supple without nodes thyromegaly. No carotid bruits. Chest is clear to all station. Heart regular without murmurs gallops or arrhythmias. Abdomen soft without mass tenderness. Normal male external genitalia with testes. extremities without edema. Good pulses. Skin grossly normal today. Did not do a prostate check per his request  Assessment: Annual physical exam Hypertension Fatigue Stress induced headaches Seasonal allergies Myalgia of lower extremities History of sickle cell trait  Plan: Check labs including his B12, CMP, lipids, TSH CBC, and sedimentation rate and do an EKG on him.  Results for orders placed or performed in visit on 05/01/14  POCT CBC  Result Value Ref Range   WBC 4.5 (A) 4.6 - 10.2 K/uL   Lymph, poc 1.4 0.6 - 3.4   POC LYMPH PERCENT 31.2 10 - 50 %L   MID (cbc) 0.4 0 - 0.9   POC MID % 8.8 0 - 12 %M   POC Granulocyte 2.7 2 - 6.9   Granulocyte percent 60.0 37 - 80 %G   RBC 4.76 4.69 - 6.13 M/uL   Hemoglobin 12.7 (A) 14.1 - 18.1 g/dL   HCT, POC 39.4 (A) 43.5 - 53.7 %   MCV 82.7 80 - 97 fL   MCH, POC 26.6 (A) 27 - 31.2 pg   MCHC 32.2 31.8 - 35.4 g/dL   RDW, POC 13.7 %   Platelet Count, POC 228 142 - 424 K/uL   MPV 6.7 0 - 99.8 fL  POCT glycosylated hemoglobin (Hb A1C)  Result Value Ref Range   Hemoglobin A1C 5.2

## 2014-05-01 NOTE — Patient Instructions (Signed)
The labs that have come back so far look good. However other labs have been sent off. Will discuss them with you in a few days when the reports come back  Advise purchasing over-the-counter vitamin B12 and take one every day  Return as needed

## 2014-05-02 ENCOUNTER — Encounter: Payer: Self-pay | Admitting: Radiology

## 2015-01-24 ENCOUNTER — Encounter: Payer: Self-pay | Admitting: Gastroenterology

## 2015-05-12 ENCOUNTER — Other Ambulatory Visit: Payer: Self-pay | Admitting: Family Medicine

## 2015-08-18 ENCOUNTER — Other Ambulatory Visit: Payer: Self-pay | Admitting: Family Medicine

## 2015-08-20 ENCOUNTER — Other Ambulatory Visit: Payer: Self-pay | Admitting: Family Medicine

## 2015-08-20 DIAGNOSIS — I10 Essential (primary) hypertension: Secondary | ICD-10-CM

## 2015-08-21 ENCOUNTER — Ambulatory Visit: Payer: Managed Care, Other (non HMO)

## 2015-10-22 ENCOUNTER — Ambulatory Visit (INDEPENDENT_AMBULATORY_CARE_PROVIDER_SITE_OTHER): Payer: Managed Care, Other (non HMO) | Admitting: Family Medicine

## 2015-10-22 ENCOUNTER — Ambulatory Visit (INDEPENDENT_AMBULATORY_CARE_PROVIDER_SITE_OTHER): Payer: Managed Care, Other (non HMO)

## 2015-10-22 VITALS — BP 142/90 | HR 86 | Temp 97.9°F | Resp 16 | Ht 68.5 in | Wt 160.0 lb

## 2015-10-22 DIAGNOSIS — B9681 Helicobacter pylori [H. pylori] as the cause of diseases classified elsewhere: Secondary | ICD-10-CM

## 2015-10-22 DIAGNOSIS — R112 Nausea with vomiting, unspecified: Secondary | ICD-10-CM | POA: Diagnosis not present

## 2015-10-22 DIAGNOSIS — I10 Essential (primary) hypertension: Secondary | ICD-10-CM

## 2015-10-22 DIAGNOSIS — K2970 Gastritis, unspecified, without bleeding: Secondary | ICD-10-CM

## 2015-10-22 DIAGNOSIS — R1084 Generalized abdominal pain: Secondary | ICD-10-CM

## 2015-10-22 DIAGNOSIS — R3129 Other microscopic hematuria: Secondary | ICD-10-CM | POA: Diagnosis not present

## 2015-10-22 DIAGNOSIS — R197 Diarrhea, unspecified: Secondary | ICD-10-CM

## 2015-10-22 DIAGNOSIS — K8500 Idiopathic acute pancreatitis without necrosis or infection: Secondary | ICD-10-CM

## 2015-10-22 LAB — HEMOCCULT GUIAC POC 1CARD (OFFICE): FECAL OCCULT BLD: NEGATIVE

## 2015-10-22 LAB — POC MICROSCOPIC URINALYSIS (UMFC): MUCUS RE: ABSENT

## 2015-10-22 LAB — POCT CBC
Granulocyte percent: 67.3 %G (ref 37–80)
HEMATOCRIT: 40.9 % — AB (ref 43.5–53.7)
Hemoglobin: 14.2 g/dL (ref 14.1–18.1)
LYMPH, POC: 1.6 (ref 0.6–3.4)
MCH, POC: 28.5 pg (ref 27–31.2)
MCHC: 34.8 g/dL (ref 31.8–35.4)
MCV: 82 fL (ref 80–97)
MID (cbc): 0.6 (ref 0–0.9)
MPV: 7 fL (ref 0–99.8)
POC GRANULOCYTE: 4.6 (ref 2–6.9)
POC LYMPH %: 23.5 % (ref 10–50)
POC MID %: 9.2 % (ref 0–12)
Platelet Count, POC: 202 10*3/uL (ref 142–424)
RBC: 4.98 M/uL (ref 4.69–6.13)
RDW, POC: 13.5 %
WBC: 6.9 10*3/uL (ref 4.6–10.2)

## 2015-10-22 LAB — COMPREHENSIVE METABOLIC PANEL
ALBUMIN: 3.9 g/dL (ref 3.6–5.1)
ALK PHOS: 83 U/L (ref 40–115)
ALT: 7 U/L — AB (ref 9–46)
AST: 12 U/L (ref 10–35)
BUN: 15 mg/dL (ref 7–25)
CHLORIDE: 102 mmol/L (ref 98–110)
CO2: 27 mmol/L (ref 20–31)
CREATININE: 1.01 mg/dL (ref 0.70–1.33)
Calcium: 9.8 mg/dL (ref 8.6–10.3)
Glucose, Bld: 98 mg/dL (ref 65–99)
POTASSIUM: 4.6 mmol/L (ref 3.5–5.3)
SODIUM: 135 mmol/L (ref 135–146)
TOTAL PROTEIN: 6.4 g/dL (ref 6.1–8.1)
Total Bilirubin: 0.6 mg/dL (ref 0.2–1.2)

## 2015-10-22 LAB — POCT URINALYSIS DIP (MANUAL ENTRY)
Bilirubin, UA: NEGATIVE
GLUCOSE UA: NEGATIVE
Ketones, POC UA: NEGATIVE
Leukocytes, UA: NEGATIVE
Nitrite, UA: NEGATIVE
Protein Ur, POC: NEGATIVE
Spec Grav, UA: 1.02
UROBILINOGEN UA: 0.2
pH, UA: 7

## 2015-10-22 LAB — LIPASE: LIPASE: 99 U/L — AB (ref 7–60)

## 2015-10-22 MED ORDER — SUCRALFATE 1 G PO TABS: 1.0000 g | ORAL_TABLET | Freq: Three times a day (TID) | ORAL | 0 refills | Status: DC

## 2015-10-22 MED ORDER — GI COCKTAIL ~~LOC~~
30.0000 mL | Freq: Once | ORAL | Status: AC
  Administered 2015-10-22: 30 mL via ORAL

## 2015-10-22 MED ORDER — LISINOPRIL 10 MG PO TABS: ORAL_TABLET | ORAL | 3 refills | Status: DC

## 2015-10-22 MED ORDER — OMEPRAZOLE 40 MG PO CPDR: 40.0000 mg | DELAYED_RELEASE_CAPSULE | Freq: Every day | ORAL | 3 refills | Status: DC

## 2015-10-22 NOTE — Progress Notes (Signed)
Subjective:  This chart was scribed for [name] MD,  by Tamsen Roers, at [facility].  This patient was seen in room 14 and the patient's care was started at 11:36 AM.   Chief Complaint  Patient presents with  . Abdominal Pain    x 4 days  . Nausea  . Diarrhea    Turning into constipation  . Fever    Unspecified     Patient ID: Joseph Tyler, male    DOB: 31-Oct-1960, 55 y.o.   MRN: [id number]  HPI HPI Comments: EMER KAN is a 55 y.o. male who presents to the [facility] complaining of pain in the mid periumbilical area radiating anteriorly to the right upper quadrant and right flank. He has associated symptoms of nausea, diarrhea/constipation and intermittent chills onset five days ago. Patient is eating in small amounts but is not able to tolerate much as it results in him vomiting (which he has done several times).  Patients vomit contains the food which he eats.  He has only tried using ibuprofen and no other medications.  He noticed that his urine is slightly more yellow than usual. He denies any blood in his bowels or hematochezia. Patient has never had a colonoscopy.Patient states that he used to be more of a "healthy individual" but in 2013, he had pancreatitis and started having abdominal pains.  He has frequent indigestion and heartburn.  After his pancreatitis, he followed a low fat diet for about three months and was seeing Dr. Benson Norway (a gastroenterologist).  After 3 months, he was able to go back to "normal".   Patient has a history of pancreatitis (1.5 years ago).  He has no history of alcohol consumption or other hepatobiliary disease.and a normal right upper quadrant ultrasound at the same time.     Patient Active Problem List   Diagnosis Date Noted  . Fatigue 05/01/2014  . Myalgia 05/01/2014  . 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 Vitamin B12 deficiency 10/29/2013  . Tongue pain 06/08/2011  . ESSENTIAL HYPERTENSION,  BENIGN 02/09/2008  . CHEST PAIN 02/09/2008  . ABNORMAL ELECTROCARDIOGRAM 02/09/2008   Past Medical History:  Diagnosis Date  . HTN (hypertension)    on and off since 2002  . Kidney stone    No past surgical history on file. Allergies  Allergen Reactions  . Other     Eggplant- swelling    Prior to Admission medications   Medication Sig Start Date End Date Taking? Authorizing Provider  lisinopril (PRINIVIL,ZESTRIL) 10 MG tablet TAKE 1 TABLET (10 MG TOTAL) BY MOUTH DAILY. 05/13/15  Yes Posey Boyer, MD  HYDROcodone-acetaminophen (NORCO/VICODIN) 5-325 MG per tablet Take 2 tablets by mouth every 4 (four) hours as needed. Patient not taking: Reported on 10/22/2015 11/20/13   Ezequiel Essex, MD   Social History   Social History  . Marital status: Married    Spouse name: N/A  . Number of children: N/A  . Years of education: N/A   Occupational History  . Not on file.   Social History Main Topics  . Smoking status: Never Smoker  . Smokeless tobacco: Not on file  . Alcohol use No  . Drug use: Unknown  . Sexual activity: Not on file   Other Topics Concern  . Not on file   Social History Narrative   Married, 4 young children.    Works for a [employer]; has 2 jobs.    Daily  caffeine use - 4/day       Review of Systems  Constitutional: Positive for chills.  Eyes: Negative for pain, redness and itching.  Gastrointestinal: Positive for abdominal pain, constipation, diarrhea, nausea and vomiting. Negative for blood in stool.  Musculoskeletal: Negative for neck pain and neck stiffness.  Skin: Negative for color change.  Neurological: Negative for syncope and speech difficulty.       Objective:   Physical Exam  Constitutional: He is oriented to person, place, and time. He appears well-developed. No distress.  HENT:  Head: Normocephalic and atraumatic.  Eyes: Pupils are equal, round, and reactive to light.  Cardiovascular: Normal rate, regular rhythm and  normal heart sounds.  Exam reveals no gallop and no friction rub.   No murmur heard. Pulmonary/Chest: Effort normal and breath sounds normal. No respiratory distress. He has no wheezes.  Abdominal:  pain in the mid periumbilical area radiating anteriorly to the right upper quadrant and right flank  Genitourinary:  Genitourinary Comments: Normal rectal exam, normal prostate.   Neurological: He is alert and oriented to person, place, and time.  Skin: Skin is warm and dry.  Psychiatric: He has a normal mood and affect. His behavior is normal.    Vitals:   10/22/15 1056  BP: (!) 142/90  Pulse: 86  Resp: 16  Temp: 97.9 F (36.6 C)  TempSrc: Oral  SpO2: 99%  Weight: 160 lb (72.6 kg)  Height: 5' 8.5" (1.74 m)   Results for orders placed or performed in visit on 10/22/15  Comprehensive metabolic panel  Result Value Ref Range   Sodium 135 135 - 146 mmol/L   Potassium 4.6 3.5 - 5.3 mmol/L   Chloride 102 98 - 110 mmol/L   CO2 27 20 - 31 mmol/L   Glucose, Bld 98 65 - 99 mg/dL   BUN 15 7 - 25 mg/dL   Creat 1.01 0.70 - 1.33 mg/dL   Total Bilirubin 0.6 0.2 - 1.2 mg/dL   Alkaline Phosphatase 83 40 - 115 U/L   AST 12 10 - 35 U/L   ALT 7 (L) 9 - 46 U/L   Total Protein 6.4 6.1 - 8.1 g/dL   Albumin 3.9 3.6 - 5.1 g/dL   Calcium 9.8 8.6 - 10.3 mg/dL  Lipase  Result Value Ref Range   Lipase 99 (H) 7 - 60 U/L  POCT urinalysis dipstick  Result Value Ref Range   Color, UA yellow yellow   Clarity, UA clear clear   Glucose, UA negative negative   Bilirubin, UA negative negative   Ketones, POC UA negative negative   Spec Grav, UA 1.020    Blood, UA moderate (A) negative   pH, UA 7.0    Protein Ur, POC negative negative   Urobilinogen, UA 0.2    Nitrite, UA Negative Negative   Leukocytes, UA Negative Negative  POCT Microscopic Urinalysis ([facility])  Result Value Ref Range   WBC,UR,HPF,POC None None WBC/hpf   RBC,UR,HPF,POC Few (A) None RBC/hpf   Bacteria None None, Too numerous to count     Mucus Absent Absent   Epithelial Cells, UR Per Microscopy None None, Too numerous to count cells/hpf  POCT CBC  Result Value Ref Range   WBC 6.9 4.6 - 10.2 K/uL   Lymph, poc 1.6 0.6 - 3.4   POC LYMPH PERCENT 23.5 10 - 50 %L   MID (cbc) 0.6 0 - 0.9   POC MID % 9.2 0 - 12 %M   POC Granulocyte 4.6  2 - 6.9   Granulocyte percent 67.3 37 - 80 %G   RBC 4.98 4.69 - 6.13 M/uL   Hemoglobin 14.2 14.1 - 18.1 g/dL   HCT, POC 40.9 (A) 43.5 - 53.7 %   MCV 82.0 80 - 97 fL   MCH, POC 28.5 27 - 31.2 pg   MCHC 34.8 31.8 - 35.4 g/dL   RDW, POC 13.5 %   Platelet Count, POC 202 142 - 424 K/uL   MPV 7.0 0 - 99.8 fL  POCT occult blood stool  Result Value Ref Range   Fecal Occult Blood, POC Negative Negative   Card #1 Date     Card #2 Fecal Occult Blod, POC     Card #2 Date     Card #3 Fecal Occult Blood, POC     Card #3 Date     Dg Abd Acute W/chest  Result Date: 10/22/2015 CLINICAL DATA:  Periumbilical abdominal pain. Right flank pain and right upper quadrant pain. Changes in bowel habits. EXAM: DG ABDOMEN ACUTE W/ 1V CHEST COMPARISON:  11/19/2013 FINDINGS: Mild dextroconvex lower thoracic scoliosis. Old right lateral rib deformities compatible with healed fractures. The lungs appear clear. Cardiac and mediastinal margins appear normal. No blunting of the costophrenic angles. Borderline prominence of stool in the colon. Left upper quadrant loop of jejunum is upper normal size. No significant abnormal air-fluid levels. Spurring along both sacroiliac joints. No significant abnormal calcifications identified. IMPRESSION: 1. No specific bowel gas pattern abnormality is identified although there is a upper normal diameter loop of jejunum in the left upper quadrant. 2. Spurring of both sacroiliac joints. 3. Old healed right lower rib fractures. 4. Mild dextroconvex lower thoracic scoliosis. Electronically Signed   By: Van Clines M.D.   On: 10/22/2015 12:07        Assessment & Plan:   1.  Generalized abdominal pain   2. Nausea and vomiting, intractability of vomiting not specified, unspecified vomiting type   3. Diarrhea, unspecified type   4. Microscopic hematuria - recheck at f/u  Sxs sig improved after GI cocktail in office. Start ppi, cover with qid carafate for 2-3d. Low-fat diet.  HTN - refilled lisinopril 10 pt will make an appt for a CPE.   ADDENDUM:  Stat labs c/w mild pancreatitis- unknown etiology.  Start clear liquid diet x 1d, then advance to full liquid. Cont to advance to low-fat diet as long as pain has resolved. If worsening -> to ER, will need CT. Recheck in 2d as long as improving.  Orders Placed This Encounter  Procedures  . DG Abd Acute W/Chest    Standing Status:   Future    Number of Occurrences:   1    Standing Expiration Date:   10/21/2016    Order Specific Question:   Reason for exam:    Answer:   generalized abdominal pain and nausea, improves with emesis, h/o pancreatitis    Order Specific Question:   Preferred imaging location?    Answer:   External  . Comprehensive metabolic panel  . Lipase  . H. pylori breath test  . POCT urinalysis dipstick  . POCT Microscopic Urinalysis ([facility])  . POCT CBC  . POCT occult blood stool    Meds ordered this encounter  Medications  . gi cocktail (Maalox,Lidocaine,Donnatal)  . omeprazole (PRILOSEC) 40 MG capsule    Sig: Take 1 capsule (40 mg total) by mouth daily. Take 30 minutes before breakfast    Dispense:  30 capsule  Refill:  3  . lisinopril (PRINIVIL,ZESTRIL) 10 MG tablet    Sig: TAKE 1 TABLET (10 MG TOTAL) BY MOUTH DAILY.    Dispense:  90 tablet    Refill:  3  . sucralfate (CARAFATE) 1 g tablet    Sig: Take 1 tablet (1 g total) by mouth 4 (four) times daily -  with meals and at bedtime.    Dispense:  60 tablet    Refill:  0    I personally performed the services described in this documentation, which was scribed in my presence. The recorded information has been reviewed and considered,  and addended by me as needed.   [name], M.D.  [facility] [address] [place], [place] [address] [phone] phone [phone] fax  10/22/15 9:04 PM

## 2015-10-22 NOTE — Patient Instructions (Addendum)
Start a daily fiber supplement. If you are still having occasional constipation or diarrhea switch to miralax (polyethylene glycol). Use carafate for the next few days until the omeprazole kicks in. Take it 30 minutes before breakfast and supper for the next 3d - then 30 minutes before a meal daily x 6 weeks.   IF you received an x-ray today, you will receive an invoice from [facility]. Please contact [facility] at [phone] with questions or concerns regarding your invoice.   IF you received labwork today, you will receive an invoice from [facility]. Please contact [facility] at [phone] with questions or concerns regarding your invoice.   Our billing staff will not be able to assist you with questions regarding bills from these companies.  You will be contacted with the lab results as soon as they are available. The fastest way to get your results is to activate your My Chart account. Instructions are located on the last page of this paperwork. If you have not heard from [place] regarding the results in 2 weeks, please contact this office.    Low-Fat Diet for Pancreatitis or Gallbladder Conditions A low-fat diet can be helpful if you have pancreatitis or a gallbladder condition. With these conditions, your pancreas and gallbladder have trouble digesting fats. A healthy eating plan with less fat will help rest your pancreas and gallbladder and reduce your symptoms. WHAT DO I NEED TO KNOW ABOUT THIS DIET?  Eat a low-fat diet.  Reduce your fat intake to less than 20-30% of your total daily calories. This is less than 50-60 g of fat per day.  Remember that you need some fat in your diet. Ask your dietician what your daily goal should be.  Choose nonfat and low-fat healthy foods. Look for the words "nonfat," "low fat," or "fat free."  As a guide, look on the label and choose foods with less than 3 g of fat per serving. Eat only one  serving.  Avoid alcohol.  Do not smoke. If you need help quitting, talk with your health care provider.  Eat small frequent meals instead of three large heavy meals. WHAT FOODS CAN I EAT? Grains Include healthy grains and starches such as potatoes, wheat bread, fiber-rich cereal, and brown rice. Choose whole grain options whenever possible. In adults, whole grains should account for 45-65% of your daily calories.  Fruits and Vegetables Eat plenty of fruits and vegetables. Fresh fruits and vegetables add fiber to your diet. Meats and Other Protein Sources Eat lean meat such as chicken and pork. Trim any fat off of meat before cooking it. Eggs, fish, and beans are other sources of protein. In adults, these foods should account for 10-35% of your daily calories. Dairy Choose low-fat milk and dairy options. Dairy includes fat and protein, as well as calcium.  Fats and Oils Limit high-fat foods such as fried foods, sweets, baked goods, sugary drinks.  Other Creamy sauces and condiments, such as mayonnaise, can add extra fat. Think about whether or not you need to use them, or use smaller amounts or low fat options. WHAT FOODS ARE NOT RECOMMENDED?  High fat foods, such as:  [employer].  Ice cream.  [place] toast.  Sweet rolls.  Pizza.  Cheese bread.  Foods covered with batter, butter, creamy sauces, or cheese.  Fried foods.  Sugary drinks and desserts.  Foods that cause gas or bloating   This information is not intended to replace advice given to you by your  health care provider. Make sure you discuss any questions you have with your health care provider.   Document Released: 01/09/2013 Document Reviewed: 01/09/2013 Elsevier Interactive Patient Education [employer].

## 2015-10-23 LAB — H. PYLORI BREATH TEST: H. PYLORI BREATH TEST: DETECTED — AB

## 2015-10-27 MED ORDER — DOXYCYCLINE HYCLATE 100 MG PO CAPS: 100.0000 mg | ORAL_CAPSULE | Freq: Two times a day (BID) | ORAL | 0 refills | Status: DC

## 2015-10-27 MED ORDER — BISMUTH SUBSALICYLATE 262 MG PO TABS: 2.0000 | ORAL_TABLET | Freq: Four times a day (QID) | ORAL | 0 refills | Status: AC

## 2015-10-27 MED ORDER — METRONIDAZOLE 250 MG PO TABS: 250.0000 mg | ORAL_TABLET | Freq: Four times a day (QID) | ORAL | 0 refills | Status: DC

## 2015-10-27 NOTE — Addendum Note (Signed)
Addended by: [name] on: 10/27/2015 02:09 PM   Modules accepted: Orders

## 2015-11-18 ENCOUNTER — Other Ambulatory Visit: Payer: Self-pay | Admitting: Family Medicine

## 2015-12-07 ENCOUNTER — Other Ambulatory Visit: Payer: Self-pay | Admitting: Family Medicine

## 2015-12-07 MED ORDER — SUCRALFATE 1 G PO TABS: 1.0000 g | ORAL_TABLET | Freq: Three times a day (TID) | ORAL | 0 refills | Status: DC

## 2016-09-05 ENCOUNTER — Emergency Department (HOSPITAL_COMMUNITY)
Admission: EM | Admit: 2016-09-05 | Discharge: 2016-09-05 | Disposition: A | Discharge status: Home / Self Care | Payer: Managed Care, Other (non HMO) | Attending: Emergency Medicine | Admitting: Emergency Medicine

## 2016-09-05 ENCOUNTER — Encounter (HOSPITAL_COMMUNITY): Payer: Self-pay

## 2016-09-05 ENCOUNTER — Emergency Department (HOSPITAL_COMMUNITY): Payer: Managed Care, Other (non HMO)

## 2016-09-05 ENCOUNTER — Other Ambulatory Visit: Payer: Self-pay

## 2016-09-05 DIAGNOSIS — R0789 Other chest pain: Secondary | ICD-10-CM

## 2016-09-05 DIAGNOSIS — R1084 Generalized abdominal pain: Secondary | ICD-10-CM

## 2016-09-05 DIAGNOSIS — R079 Chest pain, unspecified: Secondary | ICD-10-CM

## 2016-09-05 DIAGNOSIS — E785 Hyperlipidemia, unspecified: Secondary | ICD-10-CM | POA: Insufficient documentation

## 2016-09-05 DIAGNOSIS — Z79899 Other long term (current) drug therapy: Secondary | ICD-10-CM | POA: Diagnosis not present

## 2016-09-05 DIAGNOSIS — I10 Essential (primary) hypertension: Secondary | ICD-10-CM | POA: Insufficient documentation

## 2016-09-05 HISTORY — DX: Hyperlipidemia, unspecified: E78.5

## 2016-09-05 LAB — BASIC METABOLIC PANEL
ANION GAP: 9 (ref 5–15)
BUN: 12 mg/dL (ref 6–20)
CO2: 24 mmol/L (ref 22–32)
Calcium: 9.2 mg/dL (ref 8.9–10.3)
Chloride: 103 mmol/L (ref 101–111)
Creatinine, Ser: 1.11 mg/dL (ref 0.61–1.24)
GFR calc Af Amer: >60 mL/min (ref >60)
GFR calc non Af Amer: >60 mL/min (ref >60)
Glucose, Bld: 111 mg/dL — ABNORMAL HIGH (ref 65–99)
POTASSIUM: 3.9 mmol/L (ref 3.5–5.1)
Sodium: 136 mmol/L (ref 135–145)

## 2016-09-05 LAB — HEPATIC FUNCTION PANEL
ALT: 12 U/L — ABNORMAL LOW (ref 17–63)
AST: 19 U/L (ref 15–41)
Albumin: 3.6 g/dL (ref 3.5–5.0)
Alkaline Phosphatase: 99 U/L (ref 38–126)
Bilirubin, Direct: 0.1 mg/dL (ref 0.1–0.5)
Indirect Bilirubin: 0.6 mg/dL (ref 0.3–0.9)
Total Bilirubin: 0.7 mg/dL (ref 0.3–1.2)
Total Protein: 6.9 g/dL (ref 6.5–8.1)

## 2016-09-05 LAB — CBC
HEMATOCRIT: 40 % (ref 39.0–52.0)
HEMOGLOBIN: 14 g/dL (ref 13.0–17.0)
MCH: 27.7 pg (ref 26.0–34.0)
MCHC: 35 g/dL (ref 30.0–36.0)
MCV: 79.1 fL (ref 78.0–100.0)
Platelets: 235 10*3/uL (ref 150–400)
RBC: 5.06 MIL/uL (ref 4.22–5.81)
RDW: 12.9 % (ref 11.5–15.5)
WBC: 7.1 10*3/uL (ref 4.0–10.5)

## 2016-09-05 LAB — I-STAT TROPONIN, ED
Troponin i, poc: 0 ng/mL (ref 0.00–0.08)
Troponin i, poc: 0 ng/mL (ref 0.00–0.08)

## 2016-09-05 LAB — LIPASE, BLOOD: Lipase: 26 U/L (ref 11–51)

## 2016-09-05 MED ORDER — ONDANSETRON 4 MG PO TBDP
ORAL_TABLET | ORAL | Status: AC
  Filled 2016-09-05: qty 1

## 2016-09-05 MED ORDER — PROMETHAZINE HCL 25 MG PO TABS: 25.0000 mg | ORAL_TABLET | Freq: Three times a day (TID) | ORAL | 0 refills | Status: DC | PRN

## 2016-09-05 MED ORDER — ONDANSETRON 4 MG PO TBDP
4.0000 mg | ORAL_TABLET | Freq: Once | ORAL | Status: AC | PRN
  Administered 2016-09-05: 4 mg via ORAL

## 2016-09-05 MED ORDER — GI COCKTAIL ~~LOC~~
30.0000 mL | Freq: Once | ORAL | Status: AC
  Administered 2016-09-05: 30 mL via ORAL
  Filled 2016-09-05: qty 30

## 2016-09-05 NOTE — ED Triage Notes (Signed)
Pt to ED from home c/o chest pain on the left side that radiates down and to the back.. Pt states has hx of HTN. Endorses nausea, SOB, dizziness, bloating. Resp e/u, skin warm/dry.

## 2016-09-05 NOTE — Discharge Instructions (Signed)
Follow-up with your primary care doctor.  Return here as needed.  Your testing here today did not show any significant abnormalities

## 2016-09-05 NOTE — ED Provider Notes (Signed)
[facility] DEPT Provider Note   CSN: [id number] Arrival date & time: 09/05/16  0036     History   Chief Complaint Chief Complaint  Patient presents with  . Chest Pain    HPI Joseph Tyler is a 56 y.o. male.  HPI Patient presents to the emergency department with chest discomfort that resolved last night, but now has abdominal and back pain.  He states that he did vomit once this morning around 5:30.  Patient states that he did not take any medications prior to arrival.  States nothing seems make the condition better or worse.  He states that he did have some discomfort that seemed to radiate from his abdomen to his back.  Patient states that he did also have nausea.  The nursing note notes shortness of breath and dizziness.  The patient states that he did not have any shortness of breath or dizziness. The patient denies  shortness of breath, headache,blurred vision, neck pain, fever, cough, weakness, numbness, dizziness, anorexia, edema, diarrhea, rash, back pain, dysuria, hematemesis, bloody stool, near syncope, or syncope. Past Medical History:  Diagnosis Date  . HTN (hypertension)    on and off since 2002  . Hyperlipidemia   . Kidney stone     Patient Active Problem List   Diagnosis Date Noted  . Fatigue 05/01/2014  . Myalgia 05/01/2014  . 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 Vitamin B12 deficiency 10/29/2013  . Tongue pain 06/08/2011  . ESSENTIAL HYPERTENSION, BENIGN 02/09/2008  . CHEST PAIN 02/09/2008  . ABNORMAL ELECTROCARDIOGRAM 02/09/2008    History reviewed. No pertinent surgical history.     Home Medications    Prior to Admission medications   Medication Sig Start Date End Date Taking? Authorizing Provider  lisinopril (PRINIVIL,ZESTRIL) 10 MG tablet TAKE 1 TABLET (10 MG TOTAL) BY MOUTH DAILY. 10/22/15  Yes Shawnee Knapp, MD  promethazine (PHENERGAN) 25 MG tablet Take 1 tablet (25 mg total) by mouth every 8 (eight) hours as needed for nausea or vomiting.  09/05/16   Dalia Heading, PA-C    Family History No family history on file.  Social History Social History  Substance Use Topics  . Smoking status: Never Smoker  . Smokeless tobacco: Not on file  . Alcohol use No     Allergies   Other   Review of Systems Review of Systems  All other systems negative except as documented in the HPI. All pertinent positives and negatives as reviewed in the HPI. Physical Exam Updated Vital Signs BP 134/86 (BP Location: Right Arm)   Pulse 70   Temp 98 F (36.7 C) (Oral)   Resp 16   Ht 5\' 7"  (1.702 m)   Wt 70.8 kg (156 lb)   SpO2 99%   BMI 24.43 kg/m   Physical Exam  Constitutional: He is oriented to person, place, and time. He appears well-developed and well-nourished. No distress.  HENT:  Head: Normocephalic and atraumatic.  Mouth/Throat: Oropharynx is clear and moist.  Eyes: Pupils are equal, round, and reactive to light.  Neck: Normal range of motion. Neck supple.  Cardiovascular: Normal rate, regular rhythm and normal heart sounds.  Exam reveals no gallop and no friction rub.   No murmur heard. Pulmonary/Chest: Effort normal and breath sounds normal. No respiratory distress. He has no wheezes. He exhibits no tenderness.  Abdominal: Soft. Bowel sounds are normal. He exhibits no distension and no mass. There is tenderness. There is no rebound and no guarding.    Musculoskeletal: He exhibits  no edema.  Neurological: He is alert and oriented to person, place, and time. He exhibits normal muscle tone. Coordination normal.  Skin: Skin is warm and dry. Capillary refill takes less than 2 seconds. No rash noted. No erythema.  Psychiatric: He has a normal mood and affect. His behavior is normal.  Nursing note and vitals reviewed.    ED Treatments / Results  Labs (all labs ordered are listed, but only abnormal results are displayed) Labs Reviewed  BASIC METABOLIC PANEL - Abnormal; Notable for the following:       Result Value     Glucose, Bld 111 (*)    All other components within normal limits  HEPATIC FUNCTION PANEL - Abnormal; Notable for the following:    ALT 12 (*)    All other components within normal limits  CBC  LIPASE, BLOOD  I-STAT TROPONIN, ED  I-STAT TROPONIN, ED    EKG  EKG Interpretation  Date/Time:  Sunday September 05 2016 00:43:08 EDT Ventricular Rate:  60 PR Interval:  148 QRS Duration: 82 QT Interval:  366 QTC Calculation: 366 R Axis:   48 Text Interpretation:  Normal sinus rhythm Minimal voltage criteria for LVH, may be normal variant Borderline ECG Otherwise no significant change Confirmed by Deno Etienne [phone]) on 09/05/2016 6:25:51 AM Also confirmed by Gareth Morgan [phone])  on 09/05/2016 7:57:00 AM Also confirmed by Gareth Morgan [phone]), editor Drema Pry [phone])  on 09/05/2016 8:10:46 AM       Radiology Dg Chest 2 View  Result Date: 09/05/2016 CLINICAL DATA:  Chest pain radiating to back. Shortness of breath and dizziness. History of hypertension. EXAM: CHEST  2 VIEW COMPARISON:  Chest radiograph October 22, 2015 FINDINGS: Cardiomediastinal silhouette is normal. No pleural effusions or focal consolidations. Trachea projects midline and there is no pneumothorax. Soft tissue planes and included osseous structures are non-suspicious. Old RIGHT rib fractures. Mild broad dextroscoliosis of lower thoracic spine. IMPRESSION: Stable examination:  No acute cardiopulmonary process. Electronically Signed   By: Elon Alas M.D.   On: 09/05/2016 01:50   Ct Renal Stone Study  Result Date: 09/05/2016 CLINICAL DATA:  Right back/flank pain and nausea since last night. EXAM: CT ABDOMEN AND PELVIS WITHOUT CONTRAST TECHNIQUE: Multidetector CT imaging of the abdomen and pelvis was performed following the standard protocol without IV contrast. COMPARISON:  10/22/2015 abdominal radiographs. 11/19/2013 CT angiogram of the abdomen and pelvis. FINDINGS: Lower chest: No significant pulmonary  nodules or acute consolidative airspace disease. Healed deformities in lateral lower right ribs. Hepatobiliary: Normal liver with no liver mass. Normal gallbladder with no radiopaque cholelithiasis. No biliary ductal dilatation. Pancreas: Normal, with no mass or duct dilation. Spleen: Normal size. No mass. Adrenals/Urinary Tract: Normal adrenals. No renal stones. No hydronephrosis. Simple 1.1 cm posterior lower left renal cyst. No additional contour deforming renal lesions. Normal bladder. Stomach/Bowel: Grossly normal stomach. Normal caliber small bowel with no small bowel wall thickening. Normal appendix. Normal large bowel with no diverticulosis, large bowel wall thickening or pericolonic fat stranding. Vascular/Lymphatic: Normal caliber abdominal aorta. No pathologically enlarged lymph nodes in the abdomen or pelvis. Reproductive: Top-normal size prostate. Other: No pneumoperitoneum, ascites or focal fluid collection. Musculoskeletal: No aggressive appearing focal osseous lesions. Mild thoracolumbar spondylosis. IMPRESSION: No acute abnormality. No hydronephrosis. No urolithiasis. No evidence of bowel obstruction or acute bowel inflammation. Electronically Signed   By: Ilona Sorrel M.D.   On: 09/05/2016 11:06    Procedures Procedures (including critical care time)  Medications Ordered in ED Medications  ondansetron (ZOFRAN-ODT) disintegrating tablet 4 mg (4 mg Oral Given 09/05/16 0058)  gi cocktail (Maalox,Lidocaine,Donnatal) (30 mLs Oral Given 09/05/16 1207)     Initial Impression / Assessment and Plan / ED Course  I have reviewed the triage vital signs and the nursing notes.  Pertinent labs & imaging results that were available during my care of the patient were reviewed by me and considered in my medical decision making (see chart for details).     Patient had CT done of his abdomen to rule out any ureteral stone.  Due to the fact he does have a history.  The patient also has a history of  pancreatitis and that his lab values were normal.  The patient's chest pain is pretty atypical as it is very short period of time and was multiple episodes.  Each episode lasted less than a minute.  He is not currently having any chest pain and did not have any chest pain upon my initial assessment.  I did advise him that he will need follow-up on the chest pain with his primary doctor, his abdominal discomfort has resolved.  He is feeling better.  Patient advised to slowly increase his fluid intake.  At this point, I do not have a specific cause for his abdominal discomfort, but did advise them to return for any worsening in his condition  Final Clinical Impressions(s) / ED Diagnoses   Final diagnoses:  Atypical chest pain  Nonspecific chest pain  Generalized abdominal pain    New Prescriptions Discharge Medication List as of 09/05/2016 11:15 AM    START taking these medications   Details  promethazine (PHENERGAN) 25 MG tablet Take 1 tablet (25 mg total) by mouth every 8 (eight) hours as needed for nausea or vomiting., Starting Sun 09/05/2016, Print         Viola Placeres, [place], PA-C 09/05/16 [place], MD 09/06/16 1220

## 2016-09-05 NOTE — ED Notes (Signed)
Pt awake and alert. States he no longer has chest pain but c/o abdominal and back pain. Pt staet he vomited at 5:30 this AM and felt better.

## 2016-09-05 NOTE — ED Notes (Signed)
Pt sates he understands instructions . Home stable with steady gait.

## 2016-10-26 ENCOUNTER — Other Ambulatory Visit: Payer: Self-pay | Admitting: Family Medicine

## 2016-11-01 ENCOUNTER — Other Ambulatory Visit: Payer: Self-pay | Admitting: Family Medicine

## 2016-11-27 ENCOUNTER — Other Ambulatory Visit: Payer: Self-pay | Admitting: Family Medicine

## 2016-12-02 ENCOUNTER — Ambulatory Visit: Payer: Managed Care, Other (non HMO) | Admitting: Urgent Care

## 2016-12-02 ENCOUNTER — Encounter: Payer: Self-pay | Admitting: Urgent Care

## 2016-12-02 ENCOUNTER — Other Ambulatory Visit: Payer: Self-pay

## 2016-12-02 VITALS — BP 112/72 | HR 66 | Temp 98.0°F | Resp 16 | Ht 67.5 in | Wt 159.4 lb

## 2016-12-02 DIAGNOSIS — I10 Essential (primary) hypertension: Secondary | ICD-10-CM

## 2016-12-02 DIAGNOSIS — M79662 Pain in left lower leg: Secondary | ICD-10-CM | POA: Diagnosis not present

## 2016-12-02 DIAGNOSIS — M79661 Pain in right lower leg: Secondary | ICD-10-CM

## 2016-12-02 DIAGNOSIS — K5900 Constipation, unspecified: Secondary | ICD-10-CM

## 2016-12-02 DIAGNOSIS — R1030 Lower abdominal pain, unspecified: Secondary | ICD-10-CM

## 2016-12-02 DIAGNOSIS — R7309 Other abnormal glucose: Secondary | ICD-10-CM

## 2016-12-02 MED ORDER — LISINOPRIL 10 MG PO TABS: 10.0000 mg | ORAL_TABLET | Freq: Every day | ORAL | 1 refills | Status: DC

## 2016-12-02 NOTE — Patient Instructions (Addendum)
For seasoning of food, do not use salt because it is bad for your blood pressure. Salads - kale, spinach, cabbage, spring mix; use seeds like pumpkin seeds or sunflower seeds; you can also use 1-2 hard boiled eggs. Fruits - avocadoes, berries (blueberries, raspberries, blackberries), apples, oranges, pomegranate, grapefruit Seeds - quinoa, chia seeds; you can also incorporate oatmeal Vegetables - aspargus, cauliflower, broccoli, green beans, brussel spouts, bell peppers; stay away from starchy vegetables like potatoes, carrots, peas  Brussel sprouts - Cut off stems. Place in a mixing bowl that has a lid. Pour in a 1/4-1/2 cup olive oil, spices, use a light amount of parmesan. Place on a baking sheet. Bake for 10 minutes at 400F. Take it out, eat the brussel chips. Place for another 5-10 minutes.   Mashed cauliflower - Boil a bunch of cauliflower in a pot of water. Blend in a food processor with 1-2 tablespoons of butter.  Spaghetti squash -  Cut the squash in half very carefully, clean out seeds from the middle. Place 1/2 face down in a microwave safe dish with at least 2 inches of water. Make 4-6 slits on outside of spaghetti squash and microwave for 10-12 minutes. Take out the spaghetti using a metal spoon. Repeat for the other half.   Vega protein is good protein powder, make sure you use ~6 ice cubes to give it smoothie consistency together with ~4-6 ounces of vanilla soy milk. Throw cinnamon into your shake, use peanut butter. You can also use the fruits that I listed above. Throw spinach or kale into the shake.       Hypertension Hypertension, commonly called high blood pressure, is when the force of blood pumping through the arteries is too strong. The arteries are the blood vessels that carry blood from the heart throughout the body. Hypertension forces the heart to work harder to pump blood and may cause arteries to become narrow or stiff. Having untreated or uncontrolled hypertension  can cause heart attacks, strokes, kidney disease, and other problems. A blood pressure reading consists of a higher number over a lower number. Ideally, your blood pressure should be below 120/80. The first ("top") number is called the systolic pressure. It is a measure of the pressure in your arteries as your heart beats. The second ("bottom") number is called the diastolic pressure. It is a measure of the pressure in your arteries as the heart relaxes. What are the causes? The cause of this condition is not known. What increases the risk? Some risk factors for high blood pressure are under your control. Others are not. Factors you can change  Smoking.  Having type 2 diabetes mellitus, high cholesterol, or both.  Not getting enough exercise or physical activity.  Being overweight.  Having too much fat, sugar, calories, or salt (sodium) in your diet.  Drinking too much alcohol. Factors that are difficult or impossible to change  Having chronic kidney disease.  Having a family history of high blood pressure.  Age. Risk increases with age.  Race. You may be at higher risk if you are African-American.  Gender. Men are at higher risk than women before age 24. After age 65, women are at higher risk than men.  Having obstructive sleep apnea.  Stress. What are the signs or symptoms? Extremely high blood pressure (hypertensive crisis) may cause:  Headache.  Anxiety.  Shortness of breath.  Nosebleed.  Nausea and vomiting.  Severe chest pain.  Jerky movements you cannot control (seizures).  How is  this diagnosed? This condition is diagnosed by measuring your blood pressure while you are seated, with your arm resting on a surface. The cuff of the blood pressure monitor will be placed directly against the skin of your upper arm at the level of your heart. It should be measured at least twice using the same arm. Certain conditions can cause a difference in blood pressure  between your right and left arms. Certain factors can cause blood pressure readings to be lower or higher than normal (elevated) for a short period of time:  When your blood pressure is higher when you are in a health care provider's office than when you are at home, this is called white coat hypertension. Most people with this condition do not need medicines.  When your blood pressure is higher at home than when you are in a health care provider's office, this is called masked hypertension. Most people with this condition may need medicines to control blood pressure.  If you have a high blood pressure reading during one visit or you have normal blood pressure with other risk factors:  You may be asked to return on a different day to have your blood pressure checked again.  You may be asked to monitor your blood pressure at home for 1 week or longer.  If you are diagnosed with hypertension, you may have other blood or imaging tests to help your health care provider understand your overall risk for other conditions. How is this treated? This condition is treated by making healthy lifestyle changes, such as eating healthy foods, exercising more, and reducing your alcohol intake. Your health care provider may prescribe medicine if lifestyle changes are not enough to get your blood pressure under control, and if:  Your systolic blood pressure is above 130.  Your diastolic blood pressure is above 80.  Your personal target blood pressure may vary depending on your medical conditions, your age, and other factors. Follow these instructions at home: Eating and drinking  Eat a diet that is high in fiber and potassium, and low in sodium, added sugar, and fat. An example eating plan is called the DASH (Dietary Approaches to Stop Hypertension) diet. To eat this way: ? Eat plenty of fresh fruits and vegetables. Try to fill half of your plate at each meal with fruits and vegetables. ? Eat whole grains,  such as whole wheat pasta, brown rice, or whole grain bread. Fill about one quarter of your plate with whole grains. ? Eat or drink low-fat dairy products, such as skim milk or low-fat yogurt. ? Avoid fatty cuts of meat, processed or cured meats, and poultry with skin. Fill about one quarter of your plate with lean proteins, such as fish, chicken without skin, beans, eggs, and tofu. ? Avoid premade and processed foods. These tend to be higher in sodium, added sugar, and fat.  Reduce your daily sodium intake. Most people with hypertension should eat less than 1,500 mg of sodium a day.  Limit alcohol intake to no more than 1 drink a day for nonpregnant women and 2 drinks a day for men. One drink equals 12 oz of beer, 5 oz of wine, or 1 oz of hard liquor. Lifestyle  Work with your health care provider to maintain a healthy body weight or to lose weight. Ask what an ideal weight is for you.  Get at least 30 minutes of exercise that causes your heart to beat faster (aerobic exercise) most days of the week. Activities may  include walking, swimming, or biking.  Include exercise to strengthen your muscles (resistance exercise), such as pilates or lifting weights, as part of your weekly exercise routine. Try to do these types of exercises for 30 minutes at least 3 days a week.  Do not use any products that contain nicotine or tobacco, such as cigarettes and e-cigarettes. If you need help quitting, ask your health care provider.  Monitor your blood pressure at home as told by your health care provider.  Keep all follow-up visits as told by your health care provider. This is important. Medicines  Take over-the-counter and prescription medicines only as told by your health care provider. Follow directions carefully. Blood pressure medicines must be taken as prescribed.  Do not skip doses of blood pressure medicine. Doing this puts you at risk for problems and can make the medicine less  effective.  Ask your health care provider about side effects or reactions to medicines that you should watch for. Contact a health care provider if:  You think you are having a reaction to a medicine you are taking.  You have headaches that keep coming back (recurring).  You feel dizzy.  You have swelling in your ankles.  You have trouble with your vision. Get help right away if:  You develop a severe headache or confusion.  You have unusual weakness or numbness.  You feel faint.  You have severe pain in your chest or abdomen.  You vomit repeatedly.  You have trouble breathing. Summary  Hypertension is when the force of blood pumping through your arteries is too strong. If this condition is not controlled, it may put you at risk for serious complications.  Your personal target blood pressure may vary depending on your medical conditions, your age, and other factors. For most people, a normal blood pressure is less than 120/80.  Hypertension is treated with lifestyle changes, medicines, or a combination of both. Lifestyle changes include weight loss, eating a healthy, low-sodium diet, exercising more, and limiting alcohol. This information is not intended to replace advice given to you by your health care provider. Make sure you discuss any questions you have with your health care provider. Document Released: 01/04/2005 Document Revised: 12/03/2015 Document Reviewed: 12/03/2015 Elsevier Interactive Patient Education  2018 [employer].    IF you received an x-ray today, you will receive an invoice from [facility]. Please contact [facility] at [phone] with questions or concerns regarding your invoice.   IF you received labwork today, you will receive an invoice from [facility]. Please contact [facility] at [phone] with questions or concerns regarding your invoice.   Our billing staff will not be able to assist you with questions regarding bills  from these companies.  You will be contacted with the lab results as soon as they are available. The fastest way to get your results is to activate your My Chart account. Instructions are located on the last page of this paperwork. If you have not heard from [place] regarding the results in 2 weeks, please contact this office.

## 2016-12-02 NOTE — Progress Notes (Signed)
   MRN: [id number] DOB: 1960-07-21  Subjective:   Joseph Tyler is a 56 y.o. male presenting for follow up on Hypertension.   Currently managed with lisinopril. Patient is checking blood pressure at home, generally 353-299'M systolic. Avoids salt in diet, is not exercising. Reports intermittent lower leg pain. Works for the city, walks a lot. Does not hydrate well. Denies dizziness, chronic headache, blurred vision, chest pain, shortness of breath, heart racing, palpitations, nausea, vomiting, abdominal pain, hematuria, lower leg swelling. Denies smoking cigarettes or drinking alcohol. Admits that has a difficult time with constipation. Does not eat a lot of fiber. Eats a lot of meat. Has hard stools, sometimes every other day. Also reports aches of his lower legs. Denies redness, calf pain, swelling, trauma.  Javyn has a current medication list which includes the following prescription(s): lisinopril, and the following Facility-Administered Medications: cyanocobalamin. Also is allergic to other.  [name]  has a past medical history of HTN (hypertension), Hyperlipidemia, and Kidney stone. Also  has no past surgical history on file.  Objective:   Vitals: BP 112/72   Pulse 66   Temp 98 F (36.7 C) (Oral)   Resp 16   Ht 5' 7.5" (1.715 m)   Wt 159 lb 6.4 oz (72.3 kg)   SpO2 98%   BMI 24.60 kg/m   Physical Exam  Constitutional: He is oriented to person, place, and time. He appears well-developed and well-nourished.  HENT:  Mouth/Throat: Oropharynx is clear and moist.  Eyes: No scleral icterus.  Cardiovascular: Normal rate, regular rhythm and intact distal pulses. Exam reveals no gallop and no friction rub.  No murmur heard. Pulmonary/Chest: No respiratory distress. He has no wheezes. He has no rales.  Abdominal: Soft. Bowel sounds are normal. He exhibits no distension and no mass. There is tenderness (mild over lower abdomen with deep palpation). There is no guarding.  Musculoskeletal:  He exhibits no edema.       Right lower leg: He exhibits no tenderness, no bony tenderness, no swelling, no edema and no deformity.       Left lower leg: He exhibits no tenderness, no bony tenderness, no swelling, no edema and no deformity.  Neurological: He is alert and oriented to person, place, and time.  Skin: Skin is warm and dry.   Assessment and Plan :   1. Essential hypertension - Stable, refilled lisinopril. Dietary modifications, exercise. Labs pending. - Basic metabolic panel - Microalbumin / creatinine urine ratio  2. Pain in both lower legs - Labs pending, recommended better hydration.  3. Constipation, unspecified constipation type 4. Lower abdominal pain - Hydrate better, exercise, eat more fiber. Return-to-clinic precautions discussed, patient verbalized understanding.   Jaynee Eagles, PA-C [facility] [phone] 12/02/2016  2:55 PM

## 2016-12-03 LAB — BASIC METABOLIC PANEL
BUN/Creatinine Ratio: 15 (ref 9–20)
BUN: 15 mg/dL (ref 6–24)
CALCIUM: 9.3 mg/dL (ref 8.7–10.2)
CHLORIDE: 104 mmol/L (ref 96–106)
CO2: 22 mmol/L (ref 20–29)
Creatinine, Ser: 1.01 mg/dL (ref 0.76–1.27)
GFR, EST AFRICAN AMERICAN: 96 mL/min/{1.73_m2} (ref >59)
GFR, EST NON AFRICAN AMERICAN: 83 mL/min/{1.73_m2} (ref >59)
Glucose: 117 mg/dL — ABNORMAL HIGH (ref 65–99)
Potassium: 4.1 mmol/L (ref 3.5–5.2)
Sodium: 141 mmol/L (ref 134–144)

## 2016-12-03 LAB — CK: CK TOTAL: 61 U/L (ref 24–204)

## 2016-12-03 LAB — SEDIMENTATION RATE: Sed Rate: 19 mm/hr (ref 0–30)

## 2016-12-03 LAB — MICROALBUMIN / CREATININE URINE RATIO
Creatinine, Urine: 94 mg/dL
MICROALB/CREAT RATIO: 18.4 mg/g{creat} (ref 0.0–30.0)
MICROALBUM., U, RANDOM: 17.3 ug/mL

## 2016-12-08 ENCOUNTER — Ambulatory Visit: Payer: Managed Care, Other (non HMO) | Admitting: Family Medicine

## 2016-12-08 NOTE — Addendum Note (Signed)
Addended by: Gari Crown D on: 12/08/2016 10:29 AM   Modules accepted: Orders

## 2016-12-11 LAB — HEMOGLOBIN A1C
ESTIMATED AVERAGE GLUCOSE: 117 mg/dL
Hgb A1c MFr Bld: 5.7 % — ABNORMAL HIGH (ref 4.8–5.6)

## 2016-12-25 ENCOUNTER — Ambulatory Visit: Payer: Managed Care, Other (non HMO) | Admitting: Family Medicine

## 2017-05-07 ENCOUNTER — Other Ambulatory Visit: Payer: Self-pay | Admitting: Urgent Care

## 2017-11-01 ENCOUNTER — Other Ambulatory Visit: Payer: Self-pay | Admitting: Urgent Care

## 2018-01-25 ENCOUNTER — Other Ambulatory Visit: Payer: Self-pay | Admitting: Family Medicine

## 2018-01-25 NOTE — Telephone Encounter (Signed)
Courtesy refill until appointment 02/09/18.

## 2018-01-30 ENCOUNTER — Ambulatory Visit (INDEPENDENT_AMBULATORY_CARE_PROVIDER_SITE_OTHER): Payer: Managed Care, Other (non HMO)

## 2018-01-30 ENCOUNTER — Encounter: Payer: Self-pay | Admitting: Family Medicine

## 2018-01-30 ENCOUNTER — Other Ambulatory Visit: Payer: Self-pay

## 2018-01-30 ENCOUNTER — Ambulatory Visit (HOSPITAL_COMMUNITY): Payer: Managed Care, Other (non HMO)

## 2018-01-30 ENCOUNTER — Emergency Department (HOSPITAL_COMMUNITY)
Admission: EM | Admit: 2018-01-30 | Discharge: 2018-01-30 | Discharge status: Absent without leave | Payer: Managed Care, Other (non HMO)

## 2018-01-30 ENCOUNTER — Ambulatory Visit (INDEPENDENT_AMBULATORY_CARE_PROVIDER_SITE_OTHER): Payer: Managed Care, Other (non HMO) | Admitting: Family Medicine

## 2018-01-30 ENCOUNTER — Ambulatory Visit (HOSPITAL_COMMUNITY)
Admission: RE | Admit: 2018-01-30 | Discharge: 2018-01-30 | Disposition: A | Discharge status: Home / Self Care | Payer: Managed Care, Other (non HMO) | Source: Ambulatory Visit | Attending: Family Medicine | Admitting: Family Medicine

## 2018-01-30 VITALS — BP 137/79 | HR 79 | Temp 98.4°F | Resp 18 | Ht 68.5 in | Wt 157.6 lb

## 2018-01-30 DIAGNOSIS — R112 Nausea with vomiting, unspecified: Secondary | ICD-10-CM | POA: Diagnosis not present

## 2018-01-30 DIAGNOSIS — R140 Abdominal distension (gaseous): Secondary | ICD-10-CM | POA: Diagnosis not present

## 2018-01-30 DIAGNOSIS — K5900 Constipation, unspecified: Secondary | ICD-10-CM | POA: Diagnosis not present

## 2018-01-30 DIAGNOSIS — R109 Unspecified abdominal pain: Secondary | ICD-10-CM

## 2018-01-30 DIAGNOSIS — R1011 Right upper quadrant pain: Secondary | ICD-10-CM | POA: Insufficient documentation

## 2018-01-30 LAB — POC MICROSCOPIC URINALYSIS (UMFC): MUCUS RE: ABSENT

## 2018-01-30 LAB — POCT URINALYSIS DIP (MANUAL ENTRY)
Bilirubin, UA: NEGATIVE
GLUCOSE UA: NEGATIVE mg/dL
Ketones, POC UA: NEGATIVE mg/dL
Leukocytes, UA: NEGATIVE
NITRITE UA: NEGATIVE
Protein Ur, POC: 100 mg/dL — AB
SPEC GRAV UA: 1.015 (ref 1.010–1.025)
UROBILINOGEN UA: 0.2 U/dL
pH, UA: 6 (ref 5.0–8.0)

## 2018-01-30 LAB — GLUCOSE, POCT (MANUAL RESULT ENTRY): POC Glucose: 99 mg/dl (ref 70–99)

## 2018-01-30 LAB — POCT CBC
Granulocyte percent: 65.5 %G (ref 37–80)
HEMATOCRIT: 42.2 % — AB (ref 29–41)
HEMOGLOBIN: 13.8 g/dL (ref 11–14.6)
LYMPH, POC: 1.8 (ref 0.6–3.4)
MCH, POC: 27.1 pg (ref 27–31.2)
MCHC: 32.7 g/dL (ref 31.8–35.4)
MCV: 82.9 fL (ref 76–111)
MID (cbc): 0.2 (ref 0–0.9)
MPV: 7 fL (ref 0–99.8)
POC GRANULOCYTE: 3.9 (ref 2–6.9)
POC LYMPH PERCENT: 31.1 %L (ref 10–50)
POC MID %: 3.5 %M (ref 0–12)
Platelet Count, POC: 271 10*3/uL (ref 142–424)
RBC: 5.09 M/uL (ref 4.69–6.13)
RDW, POC: 13.4 %
WBC: 5.9 10*3/uL (ref 4.6–10.2)

## 2018-01-30 MED ORDER — HYDROCODONE-ACETAMINOPHEN 5-325 MG PO TABS: 1.0000 | ORAL_TABLET | ORAL | 0 refills | Status: DC | PRN

## 2018-01-30 NOTE — Patient Instructions (Addendum)
  This is suspicious for a kidney stone, though it still could be from something else going on in the right upper abdomen.  Other tests are still pending.  We are sending you for a kidney stone CT scan this afternoon if we can.  If necessary take hydrocodone 5 one every 4-6 hours for pain.  If severely worse go to the emergency room.  I will try and let you know the results of your CT scan as soon as I can.  If you have lab work done today you will be contacted with your lab results within the next 2 weeks.  If you have not heard from [place] then please contact us. The fastest way to get your results is to register for My Chart.   IF you received an x-ray today, you will receive an invoice from [facility]. Please contact [facility] at [phone] with questions or concerns regarding your invoice.   IF you received labwork today, you will receive an invoice from [facility]. Please contact [facility] at [phone] with questions or concerns regarding your invoice.   Our billing staff will not be able to assist you with questions regarding bills from these companies.  You will be contacted with the lab results as soon as they are available. The fastest way to get your results is to activate your My Chart account. Instructions are located on the last page of this paperwork. If you have not heard from [place] regarding the results in 2 weeks, please contact this office.

## 2018-01-30 NOTE — Progress Notes (Signed)
Patient ID: Joseph Tyler, male    DOB: Dec 10, 1960  Age: 58 y.o. MRN: [id number]  Chief Complaint  Patient presents with  . Abdominal Pain    X 3 days   . Flank Pain    X 3 days with lower back pain  . Emesis    X last night    Subjective:   58 year old man previously known to me.  He was in here a couple of years ago with abdominal pain problems and was diagnosed with a mild pancreatitis.  He did have an ultrasound at that time which was normal.  For the last 3 days he has been having pain in the epigastrium and right upper quadrant.  He feels bloated.  He has been nauseous and vomited once.  He felt chills last night and thinks he may have had some fever.  He has a history of constipation and a lot of gas.  He still works regularly in [employer].  He is planning to retire in a few years.  He is from the [place] area and hopes to go back and do some volunteer help sometime in the future.  Current allergies, medications, problem list, past/family and social histories reviewed.  Objective:  BP 137/79   Pulse 79   Temp 98.4 F (36.9 C) (Oral)   Resp 18   Ht 5' 8.5" (1.74 m)   Wt 157 lb 9.6 oz (71.5 kg)   SpO2 98%   BMI 23.61 kg/m   Alert and oriented.  No acute distress.  Throat clear.  Neck supple without nodes.  Chest clear.  Heart regular without murmur.  Abdomen has active bowel sounds.  Soft without distention at this time.  No masses appreciated.  He is tender in the periumbilical, epigastric, and right upper quadrant areas.  Assessment & Plan:   Assessment: 1. RUQ abdominal pain   2. Abdominal bloating   3. Nausea and vomiting, intractability of vomiting not specified, unspecified vomiting type   4. Constipation, unspecified constipation type   5. Right upper quadrant abdominal pain   6. Right flank pain       Plan: See instructions.  Labs and x-ray ordered.  ABDOMEN - 2 VIEW  COMPARISON:  CT 09/05/2016.  Radiographs  10/22/2015.  FINDINGS: The bowel gas pattern is normal. There is no free intraperitoneal air. There is a questionable new calcification overlying the inferior right sacrum. No other suspicious abdominal calcifications. The bones appear unremarkable.  IMPRESSION: Possible new calcification overlying the inferior aspect of the right sacrum, indeterminate in etiology. Normal bowel gas pattern.  Results for orders placed or performed in visit on 01/30/18  POCT CBC  Result Value Ref Range   WBC 5.9 4.6 - 10.2 K/uL   Lymph, poc 1.8 0.6 - 3.4   POC LYMPH PERCENT 31.1 10 - 50 %L   MID (cbc) 0.2 0 - 0.9   POC MID % 3.5 0 - 12 %M   POC Granulocyte 3.9 2 - 6.9   Granulocyte percent 65.5 37 - 80 %G   RBC 5.09 4.69 - 6.13 M/uL   Hemoglobin 13.8 11 - 14.6 g/dL   HCT, POC 42.2 (A) 29 - 41 %   MCV 82.9 76 - 111 fL   MCH, POC 27.1 27 - 31.2 pg   MCHC 32.7 31.8 - 35.4 g/dL   RDW, POC 13.4 %   Platelet Count, POC 271 142 - 424 K/uL   MPV 7.0 0 - 99.8 fL  POCT urinalysis dipstick  Result Value Ref Range   Color, UA yellow yellow   Clarity, UA clear clear   Glucose, UA negative negative mg/dL   Bilirubin, UA negative negative   Ketones, POC UA negative negative mg/dL   Spec Grav, UA 1.015 1.010 - 1.025   Blood, UA moderate (A) negative   pH, UA 6.0 5.0 - 8.0   Protein Ur, POC =100 (A) negative mg/dL   Urobilinogen, UA 0.2 0.2 or 1.0 E.U./dL   Nitrite, UA Negative Negative   Leukocytes, UA Negative Negative  POCT Microscopic Urinalysis ([facility])  Result Value Ref Range   WBC,UR,HPF,POC Few (A) None WBC/hpf   RBC,UR,HPF,POC Few (A) None RBC/hpf   Bacteria None None, Too numerous to count   Mucus Absent Absent   Epithelial Cells, UR Per Microscopy Few (A) None, Too numerous to count cells/hpf  POCT glucose (manual entry)  Result Value Ref Range   POC Glucose 99 70 - 99 mg/dl    Orders Placed This Encounter  Procedures  . DG Abd 2 Views    Standing Status:   Future    Number of  Occurrences:   1    Standing Expiration Date:   01/30/2019    Order Specific Question:   Reason for Exam (SYMPTOM  OR DIAGNOSIS REQUIRED)    Answer:   epigastric and ruq pain 3 days, nausea, bloated.  Some constipation and gas.  History of mild pancreatitis 2 years ago.    Order Specific Question:   Preferred imaging location?    Answer:   External  . CT RENAL STONE STUDY    Will need to have a BUN and creatinine run at hospital stat for the procedure.    Standing Status:   Future    Standing Expiration Date:   05/01/2019    Order Specific Question:   ** REASON FOR EXAM (FREE TEXT)    Answer:   Right flank and right upper quadrant pain and microscopic hematuria    Order Specific Question:   Preferred imaging location?    Answer:   External    Order Specific Question:   Call Results- Best Contact Number?    Answer:   [phone]    Order Specific Question:   Radiology Contrast Protocol - do NOT remove file path    Answer:   [email or web address]  . Comprehensive metabolic panel  . Lipase  . POCT CBC  . POCT urinalysis dipstick  . POCT Microscopic Urinalysis ([facility])  . POCT glucose (manual entry)    No orders of the defined types were placed in this encounter.  Suspicious for kidney stone.  Will get CT scan.      Patient Instructions    This is suspicious for a kidney stone, though it still could be from something else going on in the right upper abdomen.  Other tests are still pending.  We are sending you for a kidney stone CT scan this afternoon if we can.  If necessary take hydrocodone 5 one every 4-6 hours for pain.  If severely worse go to the emergency room.  I will try and let you know the results of your CT scan as soon as I can.  If you have lab work done today you will be contacted with your lab results within the next 2 weeks.  If you have not heard from [place] then please contact us. The fastest way to get your results is to register for My  Chart.   IF  you received an x-ray today, you will receive an invoice from [facility]. Please contact [facility] at [phone] with questions or concerns regarding your invoice.   IF you received labwork today, you will receive an invoice from [place]. Please contact [facility] at [phone] with questions or concerns regarding your invoice.   Our billing staff will not be able to assist you with questions regarding bills from these companies.  You will be contacted with the lab results as soon as they are available. The fastest way to get your results is to activate your My Chart account. Instructions are located on the last page of this paperwork. If you have not heard from [place] regarding the results in 2 weeks, please contact this office.        No follow-ups on file.   Ruben Reason, MD 01/30/2018

## 2018-01-31 ENCOUNTER — Other Ambulatory Visit (HOSPITAL_COMMUNITY): Payer: Managed Care, Other (non HMO)

## 2018-01-31 LAB — COMPREHENSIVE METABOLIC PANEL
ALBUMIN: 4 g/dL (ref 3.5–5.5)
ALT: 11 IU/L (ref 0–44)
AST: 14 IU/L (ref 0–40)
Albumin/Globulin Ratio: 1.3 (ref 1.2–2.2)
Alkaline Phosphatase: 129 IU/L — ABNORMAL HIGH (ref 39–117)
BUN/Creatinine Ratio: 10 (ref 9–20)
BUN: 9 mg/dL (ref 6–24)
Bilirubin Total: 0.5 mg/dL (ref 0.0–1.2)
CALCIUM: 9.5 mg/dL (ref 8.7–10.2)
CO2: 20 mmol/L (ref 20–29)
CREATININE: 0.9 mg/dL (ref 0.76–1.27)
Chloride: 97 mmol/L (ref 96–106)
GFR calc Af Amer: 108 mL/min/{1.73_m2} (ref >59)
GFR, EST NON AFRICAN AMERICAN: 94 mL/min/{1.73_m2} (ref >59)
GLOBULIN, TOTAL: 3 g/dL (ref 1.5–4.5)
Glucose: 99 mg/dL (ref 65–99)
POTASSIUM: 4 mmol/L (ref 3.5–5.2)
SODIUM: 133 mmol/L — AB (ref 134–144)
Total Protein: 7 g/dL (ref 6.0–8.5)

## 2018-01-31 LAB — LIPASE: Lipase: 24 U/L (ref 13–78)

## 2018-02-01 ENCOUNTER — Telehealth: Payer: Self-pay | Admitting: Family Medicine

## 2018-02-01 NOTE — Telephone Encounter (Unsigned)
Copied from [place] [id number]. Topic: General - Other >> Jan 31, 2018  5:37 PM Windy Kalata wrote: Reason for CRM: patient would like to know  what is the plan or treatment going forward since his CT scan, please advise  Best call back is [phone]

## 2018-02-01 NOTE — Telephone Encounter (Signed)
Patient calling and would like to know the results from his CT scan. Very addiment about getting some answers. Advised that the results have not been finalized and that once they have been, he would receive a phone call regarding the results.

## 2018-02-02 NOTE — Telephone Encounter (Signed)
pls see note 

## 2018-02-03 ENCOUNTER — Emergency Department (HOSPITAL_COMMUNITY)
Admission: EM | Admit: 2018-02-03 | Discharge: 2018-02-04 | Disposition: A | Discharge status: Home / Self Care | Payer: Managed Care, Other (non HMO) | Attending: Emergency Medicine | Admitting: Emergency Medicine

## 2018-02-03 ENCOUNTER — Other Ambulatory Visit: Payer: Self-pay

## 2018-02-03 ENCOUNTER — Encounter (HOSPITAL_COMMUNITY): Payer: Self-pay | Admitting: Emergency Medicine

## 2018-02-03 ENCOUNTER — Emergency Department (HOSPITAL_COMMUNITY): Payer: Managed Care, Other (non HMO)

## 2018-02-03 DIAGNOSIS — R1084 Generalized abdominal pain: Secondary | ICD-10-CM | POA: Diagnosis present

## 2018-02-03 DIAGNOSIS — I10 Essential (primary) hypertension: Secondary | ICD-10-CM | POA: Insufficient documentation

## 2018-02-03 DIAGNOSIS — R1011 Right upper quadrant pain: Secondary | ICD-10-CM

## 2018-02-03 LAB — I-STAT TROPONIN, ED: Troponin i, poc: 0.01 ng/mL (ref 0.00–0.08)

## 2018-02-03 LAB — URINALYSIS, ROUTINE W REFLEX MICROSCOPIC
Bacteria, UA: NONE SEEN
Bilirubin Urine: NEGATIVE
Glucose, UA: NEGATIVE mg/dL
Ketones, ur: NEGATIVE mg/dL
Leukocytes, UA: NEGATIVE
NITRITE: NEGATIVE
PH: 5 (ref 5.0–8.0)
Protein, ur: NEGATIVE mg/dL
Specific Gravity, Urine: 1.009 (ref 1.005–1.030)

## 2018-02-03 LAB — CBC
HCT: 38.8 % — ABNORMAL LOW (ref 39.0–52.0)
Hemoglobin: 13.2 g/dL (ref 13.0–17.0)
MCH: 28 pg (ref 26.0–34.0)
MCHC: 34 g/dL (ref 30.0–36.0)
MCV: 82.2 fL (ref 80.0–100.0)
PLATELETS: 256 10*3/uL (ref 150–400)
RBC: 4.72 MIL/uL (ref 4.22–5.81)
RDW: 12.5 % (ref 11.5–15.5)
WBC: 6.8 10*3/uL (ref 4.0–10.5)
nRBC: 0 % (ref 0.0–0.2)

## 2018-02-03 LAB — COMPREHENSIVE METABOLIC PANEL
ALT: 10 U/L (ref 0–44)
AST: 14 U/L — ABNORMAL LOW (ref 15–41)
Albumin: 3.3 g/dL — ABNORMAL LOW (ref 3.5–5.0)
Alkaline Phosphatase: 78 U/L (ref 38–126)
Anion gap: 9 (ref 5–15)
BILIRUBIN TOTAL: 0.7 mg/dL (ref 0.3–1.2)
BUN: 14 mg/dL (ref 6–20)
CO2: 20 mmol/L — ABNORMAL LOW (ref 22–32)
CREATININE: 0.92 mg/dL (ref 0.61–1.24)
Calcium: 9 mg/dL (ref 8.9–10.3)
Chloride: 106 mmol/L (ref 98–111)
GFR calc Af Amer: >60 mL/min (ref >60)
GFR calc non Af Amer: >60 mL/min (ref >60)
Glucose, Bld: 100 mg/dL — ABNORMAL HIGH (ref 70–99)
Potassium: 4.1 mmol/L (ref 3.5–5.1)
Sodium: 135 mmol/L (ref 135–145)
Total Protein: 6.6 g/dL (ref 6.5–8.1)

## 2018-02-03 LAB — LIPASE, BLOOD: Lipase: 28 U/L (ref 11–51)

## 2018-02-03 MED ORDER — SODIUM CHLORIDE 0.9% FLUSH: 3.0000 mL | Freq: Once | INTRAVENOUS | Status: DC

## 2018-02-03 NOTE — ED Provider Notes (Signed)
[facility] EMERGENCY DEPARTMENT Provider Note  CSN: [id number] Arrival date & time: 02/03/18 1953  Chief Complaint(s) Chest Pain and Abdominal Pain  HPI Joseph Tyler is a 58 y.o. male   The history is provided by the patient.  Abdominal Pain   Who presents to the emergency department with 1 week of constant diffuse and migratory abdominal pain that fluctuates intermittently.  Pain is cramping or sharp at times.  Reports that he feels pain in his back and right upper quadrant mostly but also has pains in his left upper quadrant and periumbilical regions.  He reports that movement and certain foods worsen it.  He reports decreased oral intake due to nausea and emesis.  He feels like food that he eats gets stuck in his esophagus.  Denies any diarrhea.  Reports history of constipation, last bowel movement was here in the emergency department while waiting.  He denies any chest pain or shortness of breath.  No recent fevers or infections.  No suspicious food intake or sick contacts.  Patient was evaluated by his primary care 5 days ago who obtained a CT scan that was unremarkable.  Past Medical History Past Medical History:  Diagnosis Date  . HTN (hypertension)    on and off since 2002  . Hyperlipidemia   . Kidney stone    Patient Active Problem List   Diagnosis Date Noted  . Fatigue 05/01/2014  . Myalgia 05/01/2014  . 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 Vitamin B12 deficiency 10/29/2013  . Tongue pain 06/08/2011  . ESSENTIAL HYPERTENSION, BENIGN 02/09/2008  . CHEST PAIN 02/09/2008  . ABNORMAL ELECTROCARDIOGRAM 02/09/2008   Home Medication(s) Prior to Admission medications   Medication Sig Start Date End Date Taking? Authorizing Provider  alum & mag hydroxide-simeth (MAALOX ADVANCED MAX ST) 400-400-40 MG/5ML suspension Take 15 mLs by mouth every 6 (six) hours as needed for indigestion. 02/04/18   Adriel Kessen, Grayce Sessions, MD  dicyclomine (BENTYL) 20 MG tablet  Take 1 tablet (20 mg total) by mouth 2 (two) times daily as needed for spasms. 02/04/18   Virlan Kempker, Grayce Sessions, MD  HYDROcodone-acetaminophen (NORCO) 5-325 MG tablet Take 1 tablet by mouth every 4 (four) hours as needed. 01/30/18   Posey Boyer, MD  lisinopril (PRINIVIL,ZESTRIL) 10 MG tablet TAKE 1 TABLET BY MOUTH EVERY DAY 01/25/18   Shawnee Knapp, MD                                                                                                                                    Past Surgical History History reviewed. No pertinent surgical history. Family History No family history on file.  Social History Social History   Tobacco Use  . Smoking status: Never Smoker  . Smokeless tobacco: Never Used  Substance Use Topics  . Alcohol use: No  . Drug use: No   Allergies Other  Review of Systems Review of Systems All  other systems are reviewed and are negative for acute change except as noted in the HPI  Physical Exam Vital Signs  I have reviewed the triage vital signs BP (!) 132/91 (BP Location: Left Arm)   Pulse 67   Temp 98 F (36.7 C) (Oral)   Resp 18   SpO2 100%   Physical Exam Vitals signs reviewed.  Constitutional:      General: He is not in acute distress.    Appearance: He is well-developed. He is not diaphoretic.  HENT:     Head: Normocephalic and atraumatic.     Jaw: No trismus.     Right Ear: External ear normal.     Left Ear: External ear normal.     Nose: Nose normal.  Eyes:     General: No scleral icterus.    Conjunctiva/sclera: Conjunctivae normal.  Neck:     Musculoskeletal: Normal range of motion.     Trachea: Phonation normal.  Cardiovascular:     Rate and Rhythm: Normal rate and regular rhythm.  Pulmonary:     Effort: Pulmonary effort is normal. No respiratory distress.     Breath sounds: No stridor.  Abdominal:     General: There is no distension.     Tenderness: There is generalized abdominal tenderness (discomfort). There is no guarding or  rebound.     Hernia: No hernia is present.  Musculoskeletal: Normal range of motion.  Neurological:     Mental Status: He is alert and oriented to person, place, and time.  Psychiatric:        Behavior: Behavior normal.     ED Results and Treatments Labs (all labs ordered are listed, but only abnormal results are displayed) Labs Reviewed  COMPREHENSIVE METABOLIC PANEL - Abnormal; Notable for the following components:      Result Value   CO2 20 (*)    Glucose, Bld 100 (*)    Albumin 3.3 (*)    AST 14 (*)    All other components within normal limits  CBC - Abnormal; Notable for the following components:   HCT 38.8 (*)    All other components within normal limits  URINALYSIS, ROUTINE W REFLEX MICROSCOPIC - Abnormal; Notable for the following components:   Color, Urine STRAW (*)    Hgb urine dipstick MODERATE (*)    All other components within normal limits  LIPASE, BLOOD  I-STAT TROPONIN, ED                                                                                                                         EKG  EKG Interpretation  Date/Time:  Friday February 03 2018 23:42:19 EST Ventricular Rate:  63 PR Interval:    QRS Duration: 92 QT Interval:  388 QTC Calculation: 398 R Axis:   50 Text Interpretation:  Sinus rhythm Abnormal R-wave progression, early transition Consider left ventricular hypertrophy ST elevation, consider anterior injury No significant change since last tracing Confirmed by Addison Lank [phone])  on 02/03/2018 11:45:53 PM      Radiology Dg Chest 2 View  Result Date: 02/03/2018 CLINICAL DATA:  58 year old male with chest pain EXAM: CHEST - 2 VIEW COMPARISON:  Head chest radiograph dated 09/05/2016 FINDINGS: The lungs are clear. There is no pleural effusion or pneumothorax. The cardiac silhouette is within normal limits. Mild dextroscoliosis of the thoracic spine. No acute osseous pathology. IMPRESSION: No active cardiopulmonary disease. Electronically  Signed   By: Anner Crete M.D.   On: 02/03/2018 21:18   Pertinent labs & imaging results that were available during my care of the patient were reviewed by me and considered in my medical decision making (see chart for details).  Medications Ordered in ED Medications  sodium chloride flush (NS) 0.9 % injection 3 mL (3 mLs Intravenous Not Given 02/04/18 0125)  ondansetron (ZOFRAN-ODT) disintegrating tablet 4 mg (4 mg Oral Given 02/04/18 0027)  alum & mag hydroxide-simeth (MAALOX/MYLANTA) 200-200-20 MG/5ML suspension 30 mL (30 mLs Oral Given 02/04/18 0027)  dicyclomine (BENTYL) capsule 10 mg (10 mg Oral Given 02/04/18 0028)                                                                                                                                    Procedures Procedures  (including critical care time)  Medical Decision Making / ED Course I have reviewed the nursing notes for this encounter and the patient's prior records (if available in EHR or on provided paperwork).    Patient presents with 1 week of constant and fluctuating abdominal discomfort with associated nausea and emesis.  Patient is afebrile with stable vital signs.  He is well-appearing and well-hydrated, nontoxic.  Abdominal exam with diffuse discomfort but no evidence of peritonitis.  Rest of the exam as above  Screening labs grossly reassuring without leukocytosis or anemia.  No significant electrolyte derangements or renal insufficiency.  No evidence of biliary obstruction or pancreatitis.  UA with hematuria but no infection.  Doubt serious intra-abdominal Fama to assess infectious process or bowel obstruction requiring repeated imaging at this time.  Treated symptomatically with GI cocktail and Bentyl resulting in improved symptomatology.  Able to tolerate oral intake.  The patient appears reasonably screened and/or stabilized for discharge and I doubt any other medical condition or other [facility] requiring further  screening, evaluation, or treatment in the ED at this time prior to discharge.  The patient is safe for discharge with strict return precautions.   Final Clinical Impression(s) / ED Diagnoses Final diagnoses:  Generalized abdominal pain    Disposition: Discharge  Condition: Good  I have discussed the results, Dx and Tx plan with the patient who expressed understanding and agree(s) with the plan. Discharge instructions discussed at great length. The patient was given strict return precautions who verbalized understanding of the instructions. No further questions at time of discharge.    ED Discharge Orders  Ordered    alum & mag hydroxide-simeth (MAALOX ADVANCED MAX ST) 947-654-65 MG/5ML suspension  Every 6 hours PRN     02/04/18 0136    dicyclomine (BENTYL) 20 MG tablet  2 times daily PRN     02/04/18 0136           Follow Up: Shawnee Knapp, MD [place] [address] [phone]     Gastroenterology, [address] [place]  [phone]        This chart was dictated using voice recognition software.  Despite best efforts to proofread,  errors can occur which can change the documentation meaning.   Fatima Blank, MD 02/04/18 [phone]

## 2018-02-03 NOTE — ED Triage Notes (Signed)
Pt reports right sided chest pain and abdominal pain 10/10.  Pt reports emesis.

## 2018-02-03 NOTE — ED Notes (Signed)
Pt enroute to xray,  I will collect urine when pt returns.

## 2018-02-04 MED ORDER — DICYCLOMINE HCL 10 MG PO CAPS
10.0000 mg | ORAL_CAPSULE | Freq: Once | ORAL | Status: AC
  Administered 2018-02-04: 10 mg via ORAL
  Filled 2018-02-04: qty 1

## 2018-02-04 MED ORDER — ALUM & MAG HYDROXIDE-SIMETH 400-400-40 MG/5ML PO SUSP: 15.0000 mL | Freq: Four times a day (QID) | ORAL | 0 refills | Status: DC | PRN

## 2018-02-04 MED ORDER — ONDANSETRON 4 MG PO TBDP
4.0000 mg | ORAL_TABLET | Freq: Once | ORAL | Status: AC
  Administered 2018-02-04: 4 mg via ORAL
  Filled 2018-02-04: qty 1

## 2018-02-04 MED ORDER — DICYCLOMINE HCL 20 MG PO TABS: 20.0000 mg | ORAL_TABLET | Freq: Two times a day (BID) | ORAL | 0 refills | Status: DC | PRN

## 2018-02-04 MED ORDER — ALUM & MAG HYDROXIDE-SIMETH 200-200-20 MG/5ML PO SUSP
30.0000 mL | Freq: Once | ORAL | Status: AC
  Administered 2018-02-04: 30 mL via ORAL
  Filled 2018-02-04: qty 30

## 2018-02-04 NOTE — ED Notes (Signed)
Patient verbalizes understanding of discharge instructions. Opportunity for questioning and answers were provided. Armband removed by staff, pt discharged from ED ambulatory.   

## 2018-02-09 ENCOUNTER — Encounter: Payer: Managed Care, Other (non HMO) | Admitting: Family Medicine

## 2018-02-10 ENCOUNTER — Telehealth: Payer: Self-pay | Admitting: Family Medicine

## 2018-02-10 ENCOUNTER — Ambulatory Visit: Payer: Managed Care, Other (non HMO) | Admitting: Family Medicine

## 2018-02-10 NOTE — Telephone Encounter (Signed)
Called and spoke with pt regarding the appt on 02/23/18 with Dr. Brigitte Pulse. Due to Dr. Brigitte Pulse being on emergency leave, pt will need to be rescheduled. Pt states that he will be seeing his specialist and then call back to reschedule with Dr. Brigitte Pulse. When pt calls back, please schedule with Brigitte Pulse for a PHY - CPE. Thank you!

## 2018-02-11 ENCOUNTER — Other Ambulatory Visit: Payer: Self-pay | Admitting: Family Medicine

## 2018-02-13 ENCOUNTER — Other Ambulatory Visit: Payer: Self-pay | Admitting: *Deleted

## 2018-02-13 ENCOUNTER — Other Ambulatory Visit: Payer: Self-pay

## 2018-02-13 MED ORDER — LISINOPRIL 10 MG PO TABS: 10.0000 mg | ORAL_TABLET | Freq: Every day | ORAL | 0 refills | Status: DC

## 2018-02-13 NOTE — Telephone Encounter (Signed)
Requested medication (s) are due for refill today: Yes  Requested medication (s) are on the active medication list: Yes  Last refill:  01/25/18  Future visit scheduled: No  Notes to clinic:  Unable to refill, courtesy already given, appointment needed.     Requested Prescriptions  Pending Prescriptions Disp Refills   lisinopril (PRINIVIL,ZESTRIL) 10 MG tablet [Pharmacy Med Name: LISINOPRIL 10 MG TABLET] 15 tablet 0    Sig: TAKE 1 TABLET BY MOUTH EVERY DAY     Cardiovascular:  ACE Inhibitors Failed - 02/13/2018 12:15 PM      Failed - Last BP in normal range    BP Readings from Last 1 Encounters:  02/04/18 (!) 138/98         Failed - Valid encounter within last 6 months    Recent Outpatient Visits          2 weeks ago RUQ abdominal pain   Primary Care at [facility], Fenton Malling, MD   1 year ago Essential hypertension   Primary Care at [place], [place]   2 years ago Generalized abdominal pain   [facility] at [facility], Laurey Arrow, MD   3 years ago Vitamin B12 deficiency   Primary Care at [facility], Fenton Malling, MD   4 years ago Vitamin B12 deficiency   Primary Care at [facility], Benn Moulder, MD             Passed - Cr in normal range and within 180 days    Creat  Date Value Ref Range Status  10/22/2015 1.01 0.70 - 1.33 mg/dL Final    Comment:      For patients > or = 58 years of age: The upper reference limit for Creatinine is approximately 13% higher for people identified as African-American.      Creatinine, Ser  Date Value Ref Range Status  02/03/2018 0.92 0.61 - 1.24 mg/dL Final         Passed - K in normal range and within 180 days    Potassium  Date Value Ref Range Status  02/03/2018 4.1 3.5 - 5.1 mmol/L Final         Passed - Patient is not pregnant

## 2018-02-13 NOTE — Telephone Encounter (Signed)
Patient called, left VM to return call to the office to schedule a physical with another provider in order to receive medication refills.

## 2018-02-23 ENCOUNTER — Encounter: Payer: Self-pay | Admitting: Family Medicine

## 2018-03-01 ENCOUNTER — Encounter (INDEPENDENT_AMBULATORY_CARE_PROVIDER_SITE_OTHER): Payer: Self-pay

## 2018-03-01 ENCOUNTER — Encounter: Payer: Self-pay | Admitting: Gastroenterology

## 2018-03-01 ENCOUNTER — Other Ambulatory Visit (INDEPENDENT_AMBULATORY_CARE_PROVIDER_SITE_OTHER): Payer: Managed Care, Other (non HMO)

## 2018-03-01 ENCOUNTER — Ambulatory Visit (INDEPENDENT_AMBULATORY_CARE_PROVIDER_SITE_OTHER): Payer: Managed Care, Other (non HMO) | Admitting: Gastroenterology

## 2018-03-01 VITALS — BP 112/72 | HR 81 | Ht 67.0 in | Wt 157.0 lb

## 2018-03-01 DIAGNOSIS — K5900 Constipation, unspecified: Secondary | ICD-10-CM

## 2018-03-01 DIAGNOSIS — G8929 Other chronic pain: Secondary | ICD-10-CM

## 2018-03-01 DIAGNOSIS — D3610 Benign neoplasm of peripheral nerves and autonomic nervous system, unspecified: Secondary | ICD-10-CM

## 2018-03-01 DIAGNOSIS — R1011 Right upper quadrant pain: Secondary | ICD-10-CM

## 2018-03-01 LAB — HEPATIC FUNCTION PANEL
ALK PHOS: 111 U/L (ref 39–117)
ALT: 9 U/L (ref 0–53)
AST: 14 U/L (ref 0–37)
Albumin: 4.1 g/dL (ref 3.5–5.2)
BILIRUBIN DIRECT: 0.1 mg/dL (ref 0.0–0.3)
BILIRUBIN TOTAL: 0.4 mg/dL (ref 0.2–1.2)
Total Protein: 7.4 g/dL (ref 6.0–8.3)

## 2018-03-01 LAB — CORTISOL: Cortisol, Plasma: 7.5 ug/dL

## 2018-03-01 LAB — IGA: IgA: 461 mg/dL — ABNORMAL HIGH (ref 68–378)

## 2018-03-01 MED ORDER — OMEPRAZOLE 40 MG PO CPDR: 40.0000 mg | DELAYED_RELEASE_CAPSULE | Freq: Every day | ORAL | 2 refills | Status: DC

## 2018-03-01 MED ORDER — DICYCLOMINE HCL 20 MG PO TABS: 20.0000 mg | ORAL_TABLET | Freq: Two times a day (BID) | ORAL | 1 refills | Status: DC

## 2018-03-01 NOTE — Patient Instructions (Signed)
If you are age 58 or older, your body mass index should be between 23-30. Your Body mass index is 24.59 kg/m. If this is out of the aforementioned range listed, please consider follow up with your Primary Care Provider.  If you are age 57 or younger, your body mass index should be between 19-25. Your Body mass index is 24.59 kg/m. If this is out of the aformentioned range listed, please consider follow up with your Primary Care Provider.    We have sent the following medications to your pharmacy for you to pick up at your convenience: Omeprazole Bentyl  Start Miralax 1 capful daily.   You have been scheduled for an abdominal ultrasound at [facility] (1st floor of hospital) on 03/07/2018 at 11:30am. Please arrive 15 minutes prior to your appointment for registration. Make certain not to have anything to eat or drink 6 hours prior to your appointment. Should you need to reschedule your appointment, please contact radiology at [phone]. This test typically takes about 30 minutes to perform.  Your provider has requested that you go to the basement level for lab work before leaving today. Press "B" on the elevator. The lab is located at the first door on the left as you exit the elevator.    Thank you for choosing me and [facility].  Dr. Rush Landmark

## 2018-03-01 NOTE — Progress Notes (Signed)
[place] VISIT   Primary Care Provider Shawnee Knapp, MD [address] [place] [address] [phone]  Referring Provider Posey Boyer, MD [address] [place], [place] [address] [phone]  Patient Profile: Joseph Tyler is a 58 y.o. male with a pmh significant for HTN, HLD (no meds), Nephrolithiasis, recurrent abdominal pain, hx of rectal neurofibroma.  The patient presents to the [facility] for an evaluation and management of problem(s) noted below:  Problem List 1. Abdominal pain, chronic, right upper quadrant   2. Constipation, unspecified constipation type   3. Neurofibroma of the Rectum     History of Present Illness: This is the patient's first visit to the outpatient [facility].  The patient states that he has had pain for years.  He describes his discomfort mostly being on his right upper quadrant and right side.  This discomfort is noted as sharp and stabbing.  It is associated with nausea as well as bloating and abdominal distention.  He was recently evaluated approximately 4 weeks ago in the emergency department.  At times he describes a sensation of food having difficulty passing through his stomach and taking significant amounts of time.  He has had to regurgitate food.  However he does not describe true dysphagia or odynophagia.  He describes infrequent heartburn symptoms which were more of an issue before.  Aggravating factors include eating at times though not always is a postprandial worsening of pain.   The patient has dealt with constipation.  Is not had any overt rectal bleeding.  Does not take any ibuprofen or BC/Goody powders or other nonsteroidals.  He has never had an upper endoscopy.  GI Review of Systems Positive as above Negative for jaundice, melena, hematochezia  Review of Systems General: Denies fevers/chills/weight loss HEENT: Denies oral lesions Cardiovascular: Denies chest  pain/palpitations Pulmonary: Denies shortness of breath Gastroenterological: See HPI Genitourinary: Denies darkened urine Hematological: Denies easy bruising/bleeding Dermatological: Denies jaundice Psychological: Mood is stable but hopeful that we find a reason for his discomfort   Medications Current Outpatient Medications  Medication Sig Dispense Refill  . alum & mag hydroxide-simeth (MAALOX ADVANCED MAX ST) 400-400-40 MG/5ML suspension Take 15 mLs by mouth every 6 (six) hours as needed for indigestion. 355 mL 0  . dicyclomine (BENTYL) 20 MG tablet Take 1 tablet (20 mg total) by mouth 2 (two) times daily. 60 tablet 1  . lisinopril (PRINIVIL,ZESTRIL) 10 MG tablet Take 1 tablet (10 mg total) by mouth daily. 60 tablet 0  . omeprazole (PRILOSEC) 40 MG capsule Take 1 capsule (40 mg total) by mouth daily. 30 capsule 2   Current Facility-Administered Medications  Medication Dose Route Frequency Provider Last Rate Last Dose  . cyanocobalamin ((VITAMIN B-12)) injection 1,000 mcg  1,000 mcg Intramuscular Q30 days Posey Boyer, MD   1,000 mcg at 05/01/14 1058    Allergies Allergies  Allergen Reactions  . Other     Eggplant- swelling     Histories Past Medical History:  Diagnosis Date  . HTN (hypertension)    on and off since 2002  . Hyperlipidemia   . Kidney stone    History reviewed. No pertinent surgical history. Social History   Socioeconomic History  . Marital status: Married    Spouse name: Not on file  . Number of children: 4  . Years of education: Not on file  . Highest education level: Not on file  Occupational History  . Not on file  Social  Needs  . Financial resource strain: Not on file  . Food insecurity:    Worry: Not on file    Inability: Not on file  . Transportation needs:    Medical: Not on file    Non-medical: Not on file  Tobacco Use  . Smoking status: Never Smoker  . Smokeless tobacco: Never Used  Substance and Sexual Activity  . Alcohol use:  No  . Drug use: No  . Sexual activity: Yes  Lifestyle  . Physical activity:    Days per week: Not on file    Minutes per session: Not on file  . Stress: Not on file  Relationships  . Social connections:    Talks on phone: Not on file    Gets together: Not on file    Attends religious service: Not on file    Active member of club or organization: Not on file    Attends meetings of clubs or organizations: Not on file    Relationship status: Not on file  . Intimate partner violence:    Fear of current or ex partner: Not on file    Emotionally abused: Not on file    Physically abused: Not on file    Forced sexual activity: Not on file  Other Topics Concern  . Not on file  Social History Narrative   Married, 4 young children.    Works for a [employer]; has 2 jobs.    Daily caffeine use - 4/day    Family History  Problem Relation Age of Onset  . Colon cancer Neg Hx   . Esophageal cancer Neg Hx   . Inflammatory bowel disease Neg Hx   . Liver disease Neg Hx   . Pancreatic cancer Neg Hx   . Rectal cancer Neg Hx   . Stomach cancer Neg Hx    I have reviewed his medical, social, and family history in detail and updated the electronic medical record as necessary.    PHYSICAL EXAMINATION  BP 112/72   Pulse 81   Ht 5\' 7"  (1.702 m)   Wt 157 lb (71.2 kg)   BMI 24.59 kg/m  Wt Readings from Last 3 Encounters:  03/01/18 157 lb (71.2 kg)  01/30/18 157 lb 9.6 oz (71.5 kg)  12/02/16 159 lb 6.4 oz (72.3 kg)  GEN: NAD, appears stated age, doesn't appear chronically ill PSYCH: Cooperative, without pressured speech EYE: Conjunctivae pink, sclerae anicteric ENT: MMM, without oral ulcers, no erythema or exudates noted NECK: Supple CV: RR without R/Gs  RESP: CTAB posteriorly, without wheezing GI: NABS, soft, tenderness to palpation in midepigastrium, ND, without rebound or guarding, no HSM appreciated MSK/EXT: No lower extremity edema SKIN: No jaundice NEURO:  Alert &  Oriented x 3, no focal deficits   REVIEW OF DATA  I reviewed the following data at the time of this encounter:  GI Procedures and Studies  2011 colonoscopy 2 mm polyp found in the rectum status post snare resection.  Otherwise normal colonoscopy. Pathology consistent with neurofibroma  Laboratory Studies  Reviewed in epic  Imaging Studies  January 30, 2018 CT abdomen pelvis without contrast renal protocol IMPRESSION: 1. No acute abnormalities or cause for the patient's symptoms Identified.  January 30, 2018 KUB IMPRESSION: Possible new calcification overlying the inferior aspect of the right sacrum, indeterminate in etiology. Normal bowel gas pattern.  August 2018 CT abdomen pelvis without contrast renal protocol IMPRESSION: No acute abnormality. No hydronephrosis. No urolithiasis. No evidence of bowel obstruction  or acute bowel inflammation.  November 2015 right upper quadrant ultrasound IMPRESSION: No evidence of cholelithiasis or acute cholecystitis.   ASSESSMENT  Mr. Mancillas is a 58 y.o. male with a pmh significant for HTN, HLD (no meds), Nephrolithiasis, recurrent abdominal pain, hx of rectal neurofibroma.  The patient is seen today for evaluation and management of:  1. Abdominal pain, chronic, right upper quadrant   2. Constipation, unspecified constipation type   3. Neurofibroma of the Rectum    The patient is hemodynamically stable.  Etiology of his chronic abdominal discomfort is not clear.  It does seem like he does have some evidence of dyspeptic symptoms though it is not the most classic clinical history for true dyspepsia.  The patient will benefit from evaluation for H. pylori.  We will rule out biliary pathology with repeat laboratories as well as imaging of his upper abdominal organs to better understand things for him.  He deals with constipation but his pains are not likely related to constipation.  We will try and optimize things for him.  Otherwise if  his diagnostic work-up is unremarkable we will consider the role of upper and lower endoscopies.  We will initiate the patient on PPI therapy after he is given an H. pylori stool antigen.  All patient questions were answered, to the best of my ability, and the patient agrees to the aforementioned plan of action with follow-up as indicated.   PLAN  Laboratories as outlined below Patient should undergo a hepatic function panel/amylase/lipase within 6 to 10 hours of a severe episode of pain that has not been him to the hospital Abdominal ultrasound to be ordered Stool antigen for H. pylori and once this is been given he may initiate 40 mg twice daily PPI Will initiate Bentyl in case patient is having some spasms he can use this on an as-needed basis Start MiraLAX 1 capful daily Start fiber 1-2 times daily If diagnostic work-up above is unremarkable we will consider an upper and lower endoscopy   Orders Placed This Encounter  Procedures  . Helicobacter pylori special antigen  . US Abdomen Limited RUQ  . Hepatic function panel  . Cortisol  . IgA  . Tissue transglutaminase, IgA    New Prescriptions   OMEPRAZOLE (PRILOSEC) 40 MG CAPSULE    Take 1 capsule (40 mg total) by mouth daily.   Modified Medications   Modified Medication Previous Medication   DICYCLOMINE (BENTYL) 20 MG TABLET dicyclomine (BENTYL) 20 MG tablet      Take 1 tablet (20 mg total) by mouth 2 (two) times daily.    Take 1 tablet (20 mg total) by mouth 2 (two) times daily as needed for spasms.    Planned Follow Up: No follow-ups on file.   Justice Britain, MD [facility] Office # [phone]

## 2018-03-02 ENCOUNTER — Encounter: Payer: Self-pay | Admitting: Gastroenterology

## 2018-03-02 LAB — TISSUE TRANSGLUTAMINASE, IGA: (TTG) AB, IGA: 2 U/mL

## 2018-03-02 NOTE — Telephone Encounter (Signed)
Please advise 

## 2018-03-02 NOTE — Telephone Encounter (Signed)
Patient's concern/request has been addressed already by another provider or staff member. Patient has been seen in ER and by GI since then CT scan in question was unremarkable.

## 2018-03-03 ENCOUNTER — Other Ambulatory Visit: Payer: Managed Care, Other (non HMO)

## 2018-03-03 DIAGNOSIS — K5900 Constipation, unspecified: Secondary | ICD-10-CM

## 2018-03-03 DIAGNOSIS — G8929 Other chronic pain: Secondary | ICD-10-CM

## 2018-03-03 DIAGNOSIS — R1011 Right upper quadrant pain: Secondary | ICD-10-CM

## 2018-03-06 ENCOUNTER — Telehealth: Payer: Self-pay | Admitting: Family Medicine

## 2018-03-06 DIAGNOSIS — G8929 Other chronic pain: Secondary | ICD-10-CM

## 2018-03-06 DIAGNOSIS — D3610 Benign neoplasm of peripheral nerves and autonomic nervous system, unspecified: Secondary | ICD-10-CM | POA: Insufficient documentation

## 2018-03-06 DIAGNOSIS — R1011 Right upper quadrant pain: Principal | ICD-10-CM

## 2018-03-06 DIAGNOSIS — K5900 Constipation, unspecified: Secondary | ICD-10-CM | POA: Insufficient documentation

## 2018-03-06 HISTORY — DX: Other chronic pain: G89.29

## 2018-03-06 LAB — HELICOBACTER PYLORI  SPECIAL ANTIGEN
MICRO NUMBER:: 197364
RESULT:: DETECTED — AB
SPECIMEN QUALITY: ADEQUATE

## 2018-03-06 NOTE — Telephone Encounter (Signed)
Called and spoke with pt regarding their appt scheduled for 04/05/18 with Dr. Brigitte Pulse. Due to Dr. Brigitte Pulse being out of the office, pt needed to be rescheduled. I was able to reschedule for 04/13/18 with Dr. Brigitte Pulse. I advised of time and late policy as well as fasting. Pt acknowledged.

## 2018-03-07 ENCOUNTER — Ambulatory Visit (HOSPITAL_COMMUNITY): Payer: Managed Care, Other (non HMO)

## 2018-03-07 ENCOUNTER — Other Ambulatory Visit: Payer: Self-pay

## 2018-03-07 MED ORDER — OMEPRAZOLE 20 MG PO CPDR: 20.0000 mg | DELAYED_RELEASE_CAPSULE | Freq: Two times a day (BID) | ORAL | 0 refills | Status: DC

## 2018-03-07 MED ORDER — TETRACYCLINE HCL 500 MG PO CAPS: 500.0000 mg | ORAL_CAPSULE | Freq: Four times a day (QID) | ORAL | 0 refills | Status: AC

## 2018-03-07 MED ORDER — METRONIDAZOLE 250 MG PO TABS: 500.0000 mg | ORAL_TABLET | Freq: Four times a day (QID) | ORAL | 0 refills | Status: AC

## 2018-03-07 MED ORDER — BISMUTH SUBSALICYLATE 262 MG PO TABS: 2.0000 | ORAL_TABLET | Freq: Four times a day (QID) | ORAL | 0 refills | Status: AC

## 2018-03-08 ENCOUNTER — Ambulatory Visit (HOSPITAL_COMMUNITY)
Admission: RE | Admit: 2018-03-08 | Discharge: 2018-03-08 | Disposition: A | Discharge status: Home / Self Care | Payer: Managed Care, Other (non HMO) | Source: Ambulatory Visit | Attending: Gastroenterology | Admitting: Gastroenterology

## 2018-03-08 DIAGNOSIS — K5900 Constipation, unspecified: Secondary | ICD-10-CM | POA: Insufficient documentation

## 2018-03-08 DIAGNOSIS — G8929 Other chronic pain: Secondary | ICD-10-CM

## 2018-03-08 DIAGNOSIS — R1011 Right upper quadrant pain: Secondary | ICD-10-CM | POA: Insufficient documentation

## 2018-03-24 ENCOUNTER — Telehealth: Payer: Self-pay | Admitting: General Practice

## 2018-03-24 NOTE — Telephone Encounter (Signed)
Called pt. And informed them of Dr. Raul Del sudden absence and the necessity to cancel their appt scheduled with her. Pt. Was given her new practices information and was offered the opportunity to reschedule with Bee.   Pt. Expressed understanding and need to call back. If pt. Calls back please give them the opportunity to reschedule or let them know that Dr. Raul Del practice opens April 25th.   [facility] Address and phone number   [address] [place] [address] Phone: [phone]

## 2018-04-04 ENCOUNTER — Other Ambulatory Visit: Payer: Self-pay | Admitting: Gastroenterology

## 2018-04-06 ENCOUNTER — Encounter: Payer: Managed Care, Other (non HMO) | Admitting: Family Medicine

## 2018-04-11 ENCOUNTER — Ambulatory Visit: Payer: Managed Care, Other (non HMO) | Admitting: Gastroenterology

## 2018-04-12 ENCOUNTER — Encounter: Payer: Managed Care, Other (non HMO) | Admitting: Family Medicine

## 2018-04-12 ENCOUNTER — Other Ambulatory Visit: Payer: Self-pay | Admitting: Family Medicine

## 2018-04-17 NOTE — Telephone Encounter (Signed)
I am happy to have the patient continue this medication with 1 refill while we attempt to do a telehealth visit with him to discuss next steps.  WebEx visit would be great.  I will give the okay for 1 refill.  Thank you. GM

## 2018-04-17 NOTE — Telephone Encounter (Signed)
Dr. Rush Landmark,   Patients phamacy sent in refill request for Omeprazole. Looks like you were going to discuss adjusting this at his next OV. Would you take a look and see, should I schedule a Webex visit?

## 2018-04-20 MED ORDER — OMEPRAZOLE 20 MG PO CPDR: DELAYED_RELEASE_CAPSULE | ORAL | 0 refills | Status: DC

## 2018-04-20 NOTE — Addendum Note (Signed)
Addended by: Debbe Mounts on: 04/20/2018 08:39 AM   Modules accepted: Orders

## 2018-05-05 ENCOUNTER — Other Ambulatory Visit: Payer: Self-pay | Admitting: Family Medicine

## 2018-05-12 ENCOUNTER — Other Ambulatory Visit: Payer: Self-pay | Admitting: Family Medicine

## 2018-05-12 NOTE — Telephone Encounter (Signed)
Requested medication (s) are due for refill today -yes  Requested medication (s) are on the active medication list -yes  Future visit scheduled -no  Last refill: - 1 month  Notes to clinic: Patient was contacted and encouraged to make appointment- patient declined- patient was given 30 day courtesy refill- sent for review for refill request.  Requested Prescriptions  Pending Prescriptions Disp Refills   lisinopril (ZESTRIL) 10 MG tablet 30 tablet 0    Sig: Take 1 tablet (10 mg total) by mouth daily. Establish with new pcp     Cardiovascular:  ACE Inhibitors Failed - 05/12/2018  3:52 PM      Failed - Valid encounter within last 6 months    Recent Outpatient Visits          3 months ago RUQ abdominal pain   Primary Care at [facility], Fenton Malling, MD   1 year ago Essential hypertension   Primary Care at [place], [place]   2 years ago Generalized abdominal pain   [facility] at [facility], Laurey Arrow, MD   4 years ago Vitamin B12 deficiency   Primary Care at [facility], Fenton Malling, MD   4 years ago Vitamin B12 deficiency   Primary Care at [facility], Benn Moulder, MD             Passed - Cr in normal range and within 180 days    Creat  Date Value Ref Range Status  10/22/2015 1.01 0.70 - 1.33 mg/dL Final    Comment:      For patients > or = 58 years of age: The upper reference limit for Creatinine is approximately 13% higher for people identified as African-American.      Creatinine, Ser  Date Value Ref Range Status  02/03/2018 0.92 0.61 - 1.24 mg/dL Final         Passed - K in normal range and within 180 days    Potassium  Date Value Ref Range Status  02/03/2018 4.1 3.5 - 5.1 mmol/L Final         Passed - Patient is not pregnant      Passed - Last BP in normal range    BP Readings from Last 1 Encounters:  03/01/18 112/72          Requested Prescriptions  Pending Prescriptions Disp Refills   lisinopril (ZESTRIL) 10 MG tablet 30 tablet 0    Sig: Take  1 tablet (10 mg total) by mouth daily. Establish with new pcp     Cardiovascular:  ACE Inhibitors Failed - 05/12/2018  3:52 PM      Failed - Valid encounter within last 6 months    Recent Outpatient Visits          3 months ago RUQ abdominal pain   Primary Care at [facility], Fenton Malling, MD   1 year ago Essential hypertension   Primary Care at [place], [place]   2 years ago Generalized abdominal pain   [facility] at [facility], Laurey Arrow, MD   4 years ago Vitamin B12 deficiency   Primary Care at [facility], Fenton Malling, MD   4 years ago Vitamin B12 deficiency   Primary Care at [place], MD             Passed - Cr in normal range and within 180 days    Creat  Date Value Ref Range Status  10/22/2015 1.01 0.70 - 1.33 mg/dL  Final    Comment:      For patients > or = 58 years of age: The upper reference limit for Creatinine is approximately 13% higher for people identified as African-American.      Creatinine, Ser  Date Value Ref Range Status  02/03/2018 0.92 0.61 - 1.24 mg/dL Final         Passed - K in normal range and within 180 days    Potassium  Date Value Ref Range Status  02/03/2018 4.1 3.5 - 5.1 mmol/L Final         Passed - Patient is not pregnant      Passed - Last BP in normal range    BP Readings from Last 1 Encounters:  03/01/18 112/72

## 2018-05-12 NOTE — Telephone Encounter (Signed)
Patient agrees to make appointment

## 2018-05-19 ENCOUNTER — Telehealth (INDEPENDENT_AMBULATORY_CARE_PROVIDER_SITE_OTHER): Payer: Managed Care, Other (non HMO) | Admitting: Registered Nurse

## 2018-05-19 ENCOUNTER — Encounter: Payer: Self-pay | Admitting: Registered Nurse

## 2018-05-19 ENCOUNTER — Other Ambulatory Visit: Payer: Self-pay

## 2018-05-19 DIAGNOSIS — Z7689 Persons encountering health services in other specified circumstances: Secondary | ICD-10-CM

## 2018-05-19 DIAGNOSIS — I10 Essential (primary) hypertension: Secondary | ICD-10-CM

## 2018-05-19 MED ORDER — LISINOPRIL 10 MG PO TABS: 10.0000 mg | ORAL_TABLET | Freq: Every day | ORAL | 0 refills | Status: DC

## 2018-05-19 MED ORDER — LISINOPRIL 10 MG PO TABS: 10.0000 mg | ORAL_TABLET | Freq: Every day | ORAL | 1 refills | Status: DC

## 2018-05-19 NOTE — Progress Notes (Signed)
Medication refill and Transfer of Care

## 2018-05-19 NOTE — Progress Notes (Signed)
Telemedicine Encounter- SOAP NOTE Established Patient  This telephone encounter was conducted with the patient's (or proxy's) verbal consent via audio telecommunications: yes   Patient was instructed to have this encounter in a suitably private space; and to only have persons present to whom they give permission to participate. In addition, patient identity was confirmed by use of name plus two identifiers (DOB and address).  I discussed the limitations, risks, security and privacy concerns of performing an evaluation and management service by telephone and the availability of in person appointments. I also discussed with the patient that there may be a patient responsible charge related to this service. The patient expressed understanding and agreed to proceed.  I spent a total of 14 minutes talking with the patient or their proxy.  CC: Establish care, med refills  Subjective   Joseph Tyler is a pleasant 58 y.o. established patient. Telephone visit today for establishing care with myself as PCP and medication refill of lisinopril 10mg  PO daily for HTN  Pt has established history of essential HTN. Has been well controlled with lisinopril 10mg  PO daily. Will plan to continue.  Other med hx includes IBS, managed by GI. Pt has follow up post COVID-19.      Patient Active Problem List   Diagnosis Date Noted  . Abdominal pain, chronic, right upper quadrant 03/06/2018  . Constipation 03/06/2018  . 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 Fatigue 05/01/2014  . Myalgia 05/01/2014  . 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 Vitamin B12 deficiency 10/29/2013  . Tongue pain 06/08/2011  . ESSENTIAL HYPERTENSION, BENIGN 02/09/2008  . CHEST PAIN 02/09/2008  . ABNORMAL ELECTROCARDIOGRAM 02/09/2008    Past Medical History:  Diagnosis Date  . HTN (hypertension)    on and off since 2002  . Hyperlipidemia   . Kidney stone     Current Outpatient Medications  Medication Sig Dispense Refill   . alum & mag hydroxide-simeth (MAALOX ADVANCED MAX ST) 400-400-40 MG/5ML suspension Take 15 mLs by mouth every 6 (six) hours as needed for indigestion. (Patient not taking: Reported on 05/19/2018) 355 mL 0  . ibuprofen (ADVIL) 200 MG tablet Take by mouth.    Marland Kitchen lisinopril (ZESTRIL) 10 MG tablet Take 1 tablet (10 mg total) by mouth daily. 90 tablet 1  . sildenafil (REVATIO) 20 MG tablet Take by mouth.     Current Facility-Administered Medications  Medication Dose Route Frequency Provider Last Rate Last Dose  . cyanocobalamin ((VITAMIN B-12)) injection 1,000 mcg  1,000 mcg Intramuscular Q30 days Posey Boyer, MD   1,000 mcg at 05/01/14 1058    Allergies  Allergen Reactions  . Other     Eggplant- swelling     Social History   Socioeconomic History  . Marital status: Married    Spouse name: Not on file  . Number of children: 4  . Years of education: Not on file  . Highest education level: Not on file  Occupational History  . Not on file  Social Needs  . Financial resource strain: Not hard at all  . Food insecurity:    Worry: Never true    Inability: Never true  . Transportation needs:    Medical: No    Non-medical: No  Tobacco Use  . Smoking status: Never Smoker  . Smokeless tobacco: Never Used  Substance and Sexual Activity  . Alcohol use: No  . Drug use: No  . Sexual activity: Yes    Partners: Female    Birth  control/protection: Post-menopausal  Lifestyle  . Physical activity:    Days per week: 0 days    Minutes per session: 0 min  . Stress: Not at all  Relationships  . Social connections:    Talks on phone: Three times a week    Gets together: Three times a week    Attends religious service: 1 to 4 times per year    Active member of club or organization: No    Attends meetings of clubs or organizations: Never    Relationship status: Married  . Intimate partner violence:    Fear of current or ex partner: No    Emotionally abused: No    Physically abused: No     Forced sexual activity: No  Other Topics Concern  . Not on file  Social History Narrative   Married, 4 children ages 86, 5, 87, 42   Works for a [employer]; has 2 jobs.    Daily caffeine use - 4/day     Review of Systems  Constitutional: Negative for chills, fever and malaise/fatigue.  Gastrointestinal: Negative for nausea and vomiting.  All other systems reviewed and are negative.   Objective   Vitals as reported by the patient: There were no vitals filed for this visit.  Diagnoses and all orders for this visit:  Essential hypertension -     lisinopril (ZESTRIL) 10 MG tablet; Take 1 tablet (10 mg total) by mouth daily.  Encounter to establish care  Other orders -     Discontinue: lisinopril (ZESTRIL) 10 MG tablet; Take 1 tablet (10 mg total) by mouth daily. Establish with new pcp  PLAN:  Reorder lisinopril 10mg  PO daily for HTN. Given 6 mo supply. Will be due for complete physical exam at this time.  Encouraged patient to contact clinic with any questions, comments, concerns in the mean time.   I discussed the assessment and treatment plan with the patient. The patient was provided an opportunity to ask questions and all were answered. The patient agreed with the plan and demonstrated an understanding of the instructions.   The patient was advised to call back or seek an in-person evaluation if the symptoms worsen or if the condition fails to improve as anticipated.  I provided 14 minutes of non-face-to-face time during this encounter.  Maximiano Coss, NP  Primary Care at [facility]

## 2018-09-18 ENCOUNTER — Ambulatory Visit: Payer: Managed Care, Other (non HMO) | Admitting: Registered Nurse

## 2018-09-19 ENCOUNTER — Encounter: Payer: Self-pay | Admitting: Registered Nurse

## 2018-10-04 ENCOUNTER — Other Ambulatory Visit: Payer: Self-pay

## 2018-10-04 ENCOUNTER — Ambulatory Visit: Payer: Self-pay

## 2018-10-04 ENCOUNTER — Ambulatory Visit (INDEPENDENT_AMBULATORY_CARE_PROVIDER_SITE_OTHER): Payer: Managed Care, Other (non HMO) | Admitting: Orthopedic Surgery

## 2018-10-04 ENCOUNTER — Encounter: Payer: Self-pay | Admitting: Orthopedic Surgery

## 2018-10-04 DIAGNOSIS — M79605 Pain in left leg: Secondary | ICD-10-CM

## 2018-10-04 DIAGNOSIS — M79604 Pain in right leg: Secondary | ICD-10-CM | POA: Diagnosis not present

## 2018-10-06 NOTE — Progress Notes (Signed)
Office Visit Note   Patient: Joseph Tyler           Date of Birth: Oct 28, 1960           MRN: [id number] Visit Date: 10/04/2018 Requested by: Shawnee Knapp, MD No address on file PCP: Shawnee Knapp, MD  Subjective: Chief Complaint  Patient presents with  . Lower Back - Pain    HPI: Joseph Tyler is a 58 y.o. male who presents to the office complaining of lumbar back pain.  Patient notes several years of lumbar back pain with worsening radicular symptoms.  He reports numbness/tingling, weakness, radicular pain to the left toes.  He works in a [employer], which is very physical job.  He has had to miss time and work due to his symptoms.  He also notes bilateral knee pain that he localizes to the medial joint line bilaterally.  He has never had any injury that he can point to that is responsible for his backslash knee pain.  He never had surgery on his legs or on his back.  He has a history of ESI's, last received in 2012 or 2013.  He has no recent MRI of the L-spine.  Does not take blood thinners and has a family history of sickle cell trait.              ROS:  All systems reviewed are negative as they relate to the chief complaint within the history of present illness.  Patient denies fevers or chills.  Assessment & Plan: Visit Diagnoses:  1. Bilateral leg pain     Plan: Patient is a 58 year old male who presents with several years of lumbar back pain with radicular symptoms that travel into his left toes.  His pain has caused him to miss work at times.  He also has weakness on exam of the left hip flexors when compared with the contralateral side.  X-rays of the bilateral knees and the lumbar spine reveal no significant degenerative changes or any bony pathology that may be responsible for his symptoms.  Impression is herniated disc of the L-spine.  Ordered MRI of the L-spine to evaluate for disc pathology.  Patient also requested a DEXA scan due to a family  history of sickle cell trait and concern for osteoporosis.  Patient will follow-up after MRI to review results.  Follow-Up Instructions: No follow-ups on file.   Orders:  Orders Placed This Encounter  Procedures  . XR Lumbar Spine 2-3 Views  . XR Knee 1-2 Views Right  . XR Knee 1-2 Views Left  . MR Lumbar Spine w/o contrast  . DG BONE DENSITY (DXA)   No orders of the defined types were placed in this encounter.     Procedures: No procedures performed   Clinical Data: No additional findings.  Objective: Vital Signs: There were no vitals taken for this visit.  Physical Exam:  Constitutional: Patient appears well-developed HEENT:  Head: Normocephalic Eyes:EOM are normal Neck: Normal range of motion Cardiovascular: Normal rate Pulmonary/chest: Effort normal Neurologic: Patient is alert Skin: Skin is warm Psychiatric: Patient has normal mood and affect  Ortho Exam:  Back exam TTP over the axial lumbar spine.  No tenderness over the paraspinal musculature. 4/5 motor strength of the left hip flexors 5/5 motor strength of the left quadriceps, dorsiflexion, plantarflexion.  Sensation intact but diminished of the medial/lateral foot, medial/lateral calf, medial/lateral thigh Positive straight leg raise on left Normal reflexes No pain with  internal rotation of left hip  Bilateral knee Exam No effusion Extensor mechanism intact No TTP over the lateral jointlines, quad tendon, patellar tendon, pes anserinus, patella, tibial tubercle, LCL/MCL insertions Mild TTP over the medial joint lines Stable to varus/valgus stresses.  Stable to anterior/posterior drawer Extension to 0 degrees Flexion > 90 degrees  Specialty Comments:  No specialty comments available.  Imaging: No results found.   PMFS History: Patient Active Problem List   Diagnosis Date Noted  . Abdominal pain, chronic, right upper quadrant 03/06/2018  . Constipation 03/06/2018  . 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 Fatigue 05/01/2014  . Myalgia 05/01/2014  . 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 Vitamin B12 deficiency 10/29/2013  . Tongue pain 06/08/2011  . ESSENTIAL HYPERTENSION, BENIGN 02/09/2008  . CHEST PAIN 02/09/2008  . ABNORMAL ELECTROCARDIOGRAM 02/09/2008   Past Medical History:  Diagnosis Date  . HTN (hypertension)    on and off since 2002  . Hyperlipidemia   . Kidney stone     Family History  Problem Relation Age of Onset  . Colon cancer Neg Hx   . Esophageal cancer Neg Hx   . Inflammatory bowel disease Neg Hx   . Liver disease Neg Hx   . Pancreatic cancer Neg Hx   . Rectal cancer Neg Hx   . Stomach cancer Neg Hx     History reviewed. No pertinent surgical history. Social History   Occupational History  . Not on file  Tobacco Use  . Smoking status: Never Smoker  . Smokeless tobacco: Never Used  Substance and Sexual Activity  . Alcohol use: No  . Drug use: No  . Sexual activity: Yes    Partners: Female    Birth control/protection: Post-menopausal

## 2018-10-11 ENCOUNTER — Other Ambulatory Visit: Payer: Managed Care, Other (non HMO)

## 2018-10-19 ENCOUNTER — Other Ambulatory Visit: Payer: Self-pay

## 2018-10-19 ENCOUNTER — Ambulatory Visit
Admission: RE | Admit: 2018-10-19 | Discharge: 2018-10-19 | Disposition: A | Discharge status: Home / Self Care | Payer: Managed Care, Other (non HMO) | Source: Ambulatory Visit | Attending: Orthopedic Surgery | Admitting: Orthopedic Surgery

## 2018-10-19 DIAGNOSIS — M79604 Pain in right leg: Secondary | ICD-10-CM

## 2018-10-28 ENCOUNTER — Other Ambulatory Visit: Payer: Self-pay | Admitting: Registered Nurse

## 2018-10-28 DIAGNOSIS — I10 Essential (primary) hypertension: Secondary | ICD-10-CM

## 2018-10-28 NOTE — Telephone Encounter (Signed)
Requested medication (s) are due for refill today: yes  Requested medication (s) are on the active medication list: yes  Last refill:  05/19/2018  Future visit scheduled: no  Notes to clinic:  ACE inhibitors failed and CR and K= normal labs not within 180 days  Requested Prescriptions  Pending Prescriptions Disp Refills   lisinopril (ZESTRIL) 10 MG tablet [Pharmacy Med Name: LISINOPRIL 10 MG TABLET] 90 tablet 1    Sig: TAKE 1 TABLET BY MOUTH EVERY DAY     Cardiovascular:  ACE Inhibitors Failed - 10/28/2018 12:59 AM      Failed - Cr in normal range and within 180 days    Creat  Date Value Ref Range Status  10/22/2015 1.01 0.70 - 1.33 mg/dL Final    Comment:      For patients > or = 58 years of age: The upper reference limit for Creatinine is approximately 13% higher for people identified as African-American.      Creatinine, Ser  Date Value Ref Range Status  02/03/2018 0.92 0.61 - 1.24 mg/dL Final         Failed - K in normal range and within 180 days    Potassium  Date Value Ref Range Status  02/03/2018 4.1 3.5 - 5.1 mmol/L Final         Passed - Patient is not pregnant      Passed - Last BP in normal range    BP Readings from Last 1 Encounters:  03/01/18 112/72         Passed - Valid encounter within last 6 months    Recent Outpatient Visits          5 months ago Essential hypertension   [facility], Delfino Lovett, NP   9 months ago RUQ abdominal pain   Primary Care at [facility], Fenton Malling, MD   1 year ago Essential hypertension   Primary Care at [place], [place]   3 years ago Generalized abdominal pain   [facility], Laurey Arrow, MD   4 years ago Vitamin B12 deficiency   Primary Care at [facility], Fenton Malling, MD

## 2018-10-30 ENCOUNTER — Telehealth: Payer: Self-pay | Admitting: *Deleted

## 2018-10-30 NOTE — Telephone Encounter (Signed)
Please schedule patient for a BP follow up with Kathrin Ruddy.  Inform patient a 30 day supply of his medication has been called in for him.

## 2018-10-31 NOTE — Telephone Encounter (Signed)
Spoke with pt and schedule annual cpe

## 2018-11-22 ENCOUNTER — Ambulatory Visit (INDEPENDENT_AMBULATORY_CARE_PROVIDER_SITE_OTHER): Payer: Managed Care, Other (non HMO)

## 2018-11-22 ENCOUNTER — Other Ambulatory Visit: Payer: Self-pay

## 2018-11-22 ENCOUNTER — Encounter: Payer: Self-pay | Admitting: Orthopedic Surgery

## 2018-11-22 ENCOUNTER — Ambulatory Visit (INDEPENDENT_AMBULATORY_CARE_PROVIDER_SITE_OTHER): Payer: Managed Care, Other (non HMO) | Admitting: Orthopedic Surgery

## 2018-11-22 DIAGNOSIS — M79672 Pain in left foot: Secondary | ICD-10-CM

## 2018-11-24 ENCOUNTER — Encounter: Payer: Self-pay | Admitting: Orthopedic Surgery

## 2018-11-24 NOTE — Progress Notes (Signed)
Office Visit Note   Patient: Joseph Tyler           Date of Birth: [birth year]           MRN: [id number] Visit Date: 11/22/2018 Requested by: Shawnee Knapp, MD No address on file PCP: Shawnee Knapp, MD  Subjective: Chief Complaint  Patient presents with  . Left Foot - Pain    HPI: Joseph Tyler is a 58 y.o. male who presents to the office complaining of left foot pain.  Patient notes left foot pain over the past 4 months.  He denies any injury preceding onset of pain.  States that the pain is not really getting any better.  He notes pain every day that is worse with walking.  He has been taking Advil with some relief.  He localizes pain to the dorsal midfoot.  He also notes the pain is worse with tight shoes.  He had similar pain 2 to 3 years ago that resolved on its own.  He denies any pain in the right foot.  Denies any other joint swelling..                ROS:  All systems reviewed are negative as they relate to the chief complaint within the history of present illness.  Patient denies fevers or chills.  Assessment & Plan: Visit Diagnoses:  1. Pain in left foot     Plan: Patient is a 58 year old male who presents complaining of left foot pain over the last 4 months.  He localizes pain to the dorsal midfoot and is tender throughout this area.  X-ray of the left foot shows mild spurring throughout the TMT joints.  No significant evidence of stress fracture or any other acute findings.  Patient has been dealing with this pain for 4 months and it is affecting his daily life.  Ordered MRI of the left foot to evaluate for synovitis in the tarsometatarsal joints versus stress reaction.  Follow-Up Instructions: No follow-ups on file.   Orders:  Orders Placed This Encounter  Procedures  . XR Foot Complete Left  . MR Foot Left w/o contrast   No orders of the defined types were placed in this encounter.     Procedures: No procedures performed   Clinical Data:  No additional findings.  Objective: Vital Signs: There were no vitals taken for this visit.  Physical Exam:  Constitutional: Patient appears well-developed HEENT:  Head: Normocephalic Eyes:EOM are normal Neck: Normal range of motion Cardiovascular: Normal rate Pulmonary/chest: Effort normal Neurologic: Patient is alert Skin: Skin is warm Psychiatric: Patient has normal mood and affect  Ortho Exam:  TTP diffusely throughout the dorsal left midfoot. No TTP over the fifth metatarsal base, forefoot, plantar fascia, Achilles tendon, retrocalcaneal space 2+ DP pulse Sensation intact throughout the left foot Dorsiflexion/plantarflexion intact Tibialis anterior functional and palpable  Specialty Comments:  No specialty comments available.  Imaging: No results found.   PMFS History: Patient Active Problem List   Diagnosis Date Noted  . Abdominal pain, chronic, right upper quadrant 03/06/2018  . Constipation 03/06/2018  . 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 Fatigue 05/01/2014  . Myalgia 05/01/2014  . 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 Vitamin B12 deficiency 10/29/2013  . Tongue pain 06/08/2011  . ESSENTIAL HYPERTENSION, BENIGN 02/09/2008  . CHEST PAIN 02/09/2008  . ABNORMAL ELECTROCARDIOGRAM 02/09/2008   Past Medical History:  Diagnosis Date  . HTN (hypertension)    on and off since 2002  .  Hyperlipidemia   . Kidney stone     Family History  Problem Relation Age of Onset  . Colon cancer Neg Hx   . Esophageal cancer Neg Hx   . Inflammatory bowel disease Neg Hx   . Liver disease Neg Hx   . Pancreatic cancer Neg Hx   . Rectal cancer Neg Hx   . Stomach cancer Neg Hx     History reviewed. No pertinent surgical history. Social History   Occupational History  . Not on file  Tobacco Use  . Smoking status: Never Smoker  . Smokeless tobacco: Never Used  Substance and Sexual Activity  . Alcohol use: No  . Drug use: No  . Sexual activity: Yes    Partners: Female     Birth control/protection: Post-menopausal

## 2018-11-27 ENCOUNTER — Other Ambulatory Visit: Payer: Self-pay | Admitting: Registered Nurse

## 2018-11-27 DIAGNOSIS — I10 Essential (primary) hypertension: Secondary | ICD-10-CM

## 2018-11-27 NOTE — Telephone Encounter (Signed)
Requested medication (s) are due for refill today: yes  Requested medication (s) are on the active medication list: yes  Last refill: 10/30/2018  Future visit scheduled: yes   Notes to clinic: review for refill Patient has appointment coming up   Requested Prescriptions  Pending Prescriptions Disp Refills   lisinopril (ZESTRIL) 10 MG tablet [Pharmacy Med Name: LISINOPRIL 10 MG TABLET] 30 tablet 0    Sig: TAKE 1 TABLET (10 MG TOTAL) BY MOUTH DAILY. ESTABLISH WITH NEW PCP     Cardiovascular:  ACE Inhibitors Failed - 11/27/2018  2:30 PM      Failed - Cr in normal range and within 180 days    Creat  Date Value Ref Range Status  10/22/2015 1.01 0.70 - 1.33 mg/dL Final    Comment:      For patients > or = 58 years of age: The upper reference limit for Creatinine is approximately 13% higher for people identified as African-American.      Creatinine, Ser  Date Value Ref Range Status  02/03/2018 0.92 0.61 - 1.24 mg/dL Final         Failed - K in normal range and within 180 days    Potassium  Date Value Ref Range Status  02/03/2018 4.1 3.5 - 5.1 mmol/L Final         Failed - Valid encounter within last 6 months    Recent Outpatient Visits          6 months ago Essential hypertension   [facility], NP   10 months ago RUQ abdominal pain   [facility] Care at [facility], Fenton Malling, MD   1 year ago Essential hypertension   Primary Care at [facility], [place]   3 years ago Generalized abdominal pain   [facility], Laurey Arrow, MD   4 years ago Vitamin B12 deficiency   Primary Care at [facility], Fenton Malling, MD      Future Appointments            In 1 week Maximiano Coss, NP Primary Care at [place], [place] - Patient is not pregnant      Passed - Last BP in normal range    BP Readings from Last 1 Encounters:  03/01/18 112/72

## 2018-12-06 ENCOUNTER — Ambulatory Visit (INDEPENDENT_AMBULATORY_CARE_PROVIDER_SITE_OTHER): Payer: Managed Care, Other (non HMO) | Admitting: Registered Nurse

## 2018-12-06 ENCOUNTER — Encounter: Payer: Self-pay | Admitting: Registered Nurse

## 2018-12-06 ENCOUNTER — Other Ambulatory Visit: Payer: Self-pay

## 2018-12-06 VITALS — BP 123/80 | HR 66 | Temp 98.2°F | Resp 16 | Ht 68.5 in | Wt 156.0 lb

## 2018-12-06 DIAGNOSIS — Z13228 Encounter for screening for other metabolic disorders: Secondary | ICD-10-CM

## 2018-12-06 DIAGNOSIS — Z130 Encounter for screening for diseases of the blood and blood-forming organs and certain disorders involving the immune mechanism: Secondary | ICD-10-CM

## 2018-12-06 DIAGNOSIS — Z0000 Encounter for general adult medical examination without abnormal findings: Secondary | ICD-10-CM

## 2018-12-06 DIAGNOSIS — M79675 Pain in left toe(s): Secondary | ICD-10-CM | POA: Diagnosis not present

## 2018-12-06 DIAGNOSIS — R351 Nocturia: Secondary | ICD-10-CM

## 2018-12-06 DIAGNOSIS — Z1329 Encounter for screening for other suspected endocrine disorder: Secondary | ICD-10-CM | POA: Diagnosis not present

## 2018-12-06 DIAGNOSIS — Z13220 Encounter for screening for lipoid disorders: Secondary | ICD-10-CM

## 2018-12-06 DIAGNOSIS — Z0001 Encounter for general adult medical examination with abnormal findings: Secondary | ICD-10-CM

## 2018-12-06 NOTE — Patient Instructions (Addendum)
   If you have lab work done today you will be contacted with your lab results within the next 2 weeks.  If you have not heard from us then please contact us. The fastest way to get your results is to register for My Chart.   IF you received an x-ray today, you will receive an invoice from [facility]. Please contact [facility] at [phone] with questions or concerns regarding your invoice.   IF you received labwork today, you will receive an invoice from [facility]. Please contact [facility] at [phone] with questions or concerns regarding your invoice.   Our billing staff will not be able to assist you with questions regarding bills from these companies.  You will be contacted with the lab results as soon as they are available. The fastest way to get your results is to activate your My Chart account. Instructions are located on the last page of this paperwork. If you have not heard from us regarding the results in 2 weeks, please contact this office.       Health Maintenance, Male Adopting a healthy lifestyle and getting preventive care are important in promoting health and wellness. Ask your health care provider about:  The right schedule for you to have regular tests and exams.  Things you can do on your own to prevent diseases and keep yourself healthy. What should I know about diet, weight, and exercise? Eat a healthy diet   Eat a diet that includes plenty of vegetables, fruits, low-fat dairy products, and lean protein.  Do not eat a lot of foods that are high in solid fats, added sugars, or sodium. Maintain a healthy weight Body mass index (BMI) is a measurement that can be used to identify possible weight problems. It estimates body fat based on height and weight. Your health care provider can help determine your BMI and help you achieve or maintain a healthy weight. Get regular exercise Get regular exercise. This is one of the most important things  you can do for your health. Most adults should:  Exercise for at least 150 minutes each week. The exercise should increase your heart rate and make you sweat (moderate-intensity exercise).  Do strengthening exercises at least twice a week. This is in addition to the moderate-intensity exercise.  Spend less time sitting. Even light physical activity can be beneficial. Watch cholesterol and blood lipids Have your blood tested for lipids and cholesterol at 58 years of age, then have this test every 5 years. You may need to have your cholesterol levels checked more often if:  Your lipid or cholesterol levels are high.  You are older than 58 years of age.  You are at high risk for heart disease. What should I know about cancer screening? Many types of cancers can be detected early and may often be prevented. Depending on your health history and family history, you may need to have cancer screening at various ages. This may include screening for:  Colorectal cancer.  Prostate cancer.  Skin cancer.  Lung cancer. What should I know about heart disease, diabetes, and high blood pressure? Blood pressure and heart disease  High blood pressure causes heart disease and increases the risk of stroke. This is more likely to develop in people who have high blood pressure readings, are of African descent, or are overweight.  Talk with your health care provider about your target blood pressure readings.  Have your blood pressure checked: ? Every 3-5 years if you are   18-39 years of age. ? Every year if you are 40 years old or older.  If you are between the ages of 65 and 75 and are a current or former smoker, ask your health care provider if you should have a one-time screening for abdominal aortic aneurysm (AAA). Diabetes Have regular diabetes screenings. This checks your fasting blood sugar level. Have the screening done:  Once every three years after age 45 if you are at a normal weight and  have a low risk for diabetes.  More often and at a younger age if you are overweight or have a high risk for diabetes. What should I know about preventing infection? Hepatitis B If you have a higher risk for hepatitis B, you should be screened for this virus. Talk with your health care provider to find out if you are at risk for hepatitis B infection. Hepatitis C Blood testing is recommended for:  Everyone born from 1945 through 1965.  Anyone with known risk factors for hepatitis C. Sexually transmitted infections (STIs)  You should be screened each year for STIs, including gonorrhea and chlamydia, if: ? You are sexually active and are younger than 58 years of age. ? You are older than 58 years of age and your health care provider tells you that you are at risk for this type of infection. ? Your sexual activity has changed since you were last screened, and you are at increased risk for chlamydia or gonorrhea. Ask your health care provider if you are at risk.  Ask your health care provider about whether you are at high risk for HIV. Your health care provider may recommend a prescription medicine to help prevent HIV infection. If you choose to take medicine to prevent HIV, you should first get tested for HIV. You should then be tested every 3 months for as long as you are taking the medicine. Follow these instructions at home: Lifestyle  Do not use any products that contain nicotine or tobacco, such as cigarettes, e-cigarettes, and chewing tobacco. If you need help quitting, ask your health care provider.  Do not use street drugs.  Do not share needles.  Ask your health care provider for help if you need support or information about quitting drugs. Alcohol use  Do not drink alcohol if your health care provider tells you not to drink.  If you drink alcohol: ? Limit how much you have to 0-2 drinks a day. ? Be aware of how much alcohol is in your drink. In the [place] one drink equals  one 12 oz bottle of beer (355 mL), one 5 oz glass of wine (148 mL), or one 1 oz glass of hard liquor (44 mL). General instructions  Schedule regular health, dental, and eye exams.  Stay current with your vaccines.  Tell your health care provider if: ? You often feel depressed. ? You have ever been abused or do not feel safe at home. Summary  Adopting a healthy lifestyle and getting preventive care are important in promoting health and wellness.  Follow your health care provider's instructions about healthy diet, exercising, and getting tested or screened for diseases.  Follow your health care provider's instructions on monitoring your cholesterol and blood pressure. This information is not intended to replace advice given to you by your health care provider. Make sure you discuss any questions you have with your health care provider. Document Released: 07/03/2007 Document Revised: 12/28/2017 Document Reviewed: 12/28/2017 Elsevier Patient Education  2020 [employer]     Colonoscopy, Adult A colonoscopy is an exam to look at the entire large intestine. During the exam, a lubricated, flexible tube that has a camera on the end of it is inserted into the anus and then passed into the rectum, colon, and other parts of the large intestine. You may have a colonoscopy as a part of normal colorectal screening or if you have certain symptoms, such as:  Lack of red blood cells (anemia).  Diarrhea that does not go away.  Abdominal pain.  Blood in your stool (feces). A colonoscopy can help screen for and diagnose medical problems, including:  Tumors.  Polyps.  Inflammation.  Areas of bleeding. Tell a health care provider about:  Any allergies you have.  All medicines you are taking, including vitamins, herbs, eye drops, creams, and over-the-counter medicines.  Any problems you or family members have had with anesthetic medicines.  Any blood disorders you have.  Any surgeries  you have had.  Any medical conditions you have.  Any problems you have had passing stool. What are the risks? Generally, this is a safe procedure. However, problems may occur, including:  Bleeding.  A tear in the intestine.  A reaction to medicines given during the exam.  Infection (rare). What happens before the procedure? Eating and drinking restrictions Follow instructions from your health care provider about eating and drinking, which may include:  A few days before the procedure - follow a low-fiber diet. Avoid nuts, seeds, dried fruit, raw fruits, and vegetables.  1-3 days before the procedure - follow a clear liquid diet. Drink only clear liquids, such as clear broth or bouillon, black coffee or tea, clear juice, clear soft drinks or sports drinks, gelatin dessert, and popsicles. Avoid any liquids that contain red or purple dye.  On the day of the procedure - do not eat or drink anything starting 2 hours before the procedure, or within the time period that your health care provider recommends. Up to 2 hours before the procedure, you may continue to drink clear liquids, such as water or clear fruit juice. Bowel prep If you were prescribed an oral bowel prep to clean out your colon:  Take it as told by your health care provider. Starting the day before your procedure, you will need to drink a large amount of medicated liquid. The liquid will cause you to have multiple loose stools until your stool is almost clear or light green.  If your skin or anus gets irritated from diarrhea, you may use these to relieve the irritation: ? Medicated wipes, such as adult wet wipes with aloe and vitamin E. ? A skin-soothing product like petroleum jelly.  If you vomit while drinking the bowel prep, take a break for up to 60 minutes and then begin the bowel prep again. If vomiting continues and you cannot take the bowel prep without vomiting, call your health care provider.  To clean out your  colon, you may also be given: ? Laxative medicines. ? Instructions about how to use an enema. General instructions  Ask your health care provider about: ? Changing or stopping your regular medicines or supplements. This is especially important if you are taking iron supplements, diabetes medicines, or blood thinners. ? Taking medicines such as aspirin and ibuprofen. These medicines can thin your blood. Do not take these medicines before the procedure if your health care provider tells you not to.  Plan to have someone take you home from the hospital or clinic. What happens during the   procedure?   An IV may be inserted into one of your veins.  You will be given medicine to help you relax (sedative).  To reduce your risk of infection: ? Your health care team will wash or sanitize their hands. ? Your anal area will be washed with soap.  You will be asked to lie on your side with your knees bent.  Your health care provider will lubricate a long, thin, flexible tube. The tube will have a camera and a light on the end.  The tube will be inserted into your anus.  The tube will be gently eased through your rectum and colon.  Air will be delivered into your colon to keep it open. You may feel some pressure or cramping.  The camera will be used to take images during the procedure.  A small tissue sample may be removed to be examined under a microscope (biopsy).  If small polyps are found, your health care provider may remove them and have them checked for cancer cells.  When the exam is done, the tube will be removed. The procedure may vary among health care providers and hospitals. What happens after the procedure?  Your blood pressure, heart rate, breathing rate, and blood oxygen level will be monitored until the medicines you were given have worn off.  Do not drive for 24 hours after the exam.  You may have a small amount of blood in your stool.  You may pass gas and have mild  abdominal cramping or bloating due to the air that was used to inflate your colon during the exam.  It is up to you to get the results of your procedure. Ask your health care provider, or the department performing the procedure, when your results will be ready. Summary  A colonoscopy is an exam to look at the entire large intestine.  During a colonoscopy, a lubricated, flexible tube with a camera on the end of it is inserted into the anus and then passed into the colon and other parts of the large intestine.  Follow instructions from your health care provider about eating and drinking before the procedure.  If you were prescribed an oral bowel prep to clean out your colon, take it as told by your health care provider.  After your procedure, your blood pressure, heart rate, breathing rate, and blood oxygen level will be monitored until the medicines you were given have worn off. This information is not intended to replace advice given to you by your health care provider. Make sure you discuss any questions you have with your health care provider. Document Released: 01/02/2000 Document Revised: 10/27/2016 Document Reviewed: 03/18/2015 Elsevier Patient Education  2020 Elsevier Inc.     Why follow it? Research shows. . Those who follow the Mediterranean diet have a reduced risk of heart disease  . The diet is associated with a reduced incidence of Parkinson's and Alzheimer's diseases . People following the diet may have longer life expectancies and lower rates of chronic diseases  . The Dietary Guidelines for Americans recommends the Mediterranean diet as an eating plan to promote health and prevent disease  What Is the Mediterranean Diet?  . Healthy eating plan based on typical foods and recipes of Mediterranean-style cooking . The diet is primarily a plant based diet; these foods should make up a majority of meals   Starches - Plant based foods should make up a majority of meals - They  are an important sources of vitamins, minerals,   energy, antioxidants, and fiber - Choose whole grains, foods high in fiber and minimally processed items  - Typical grain sources include wheat, oats, barley, corn, brown rice, bulgar, farro, millet, polenta, couscous  - Various types of beans include chickpeas, lentils, fava beans, black beans, white beans   Fruits  Veggies - Large quantities of antioxidant rich fruits & veggies; 6 or more servings  - Vegetables can be eaten raw or lightly drizzled with oil and cooked  - Vegetables common to the traditional Mediterranean Diet include: artichokes, arugula, beets, broccoli, brussel sprouts, cabbage, carrots, celery, collard greens, cucumbers, eggplant, kale, leeks, lemons, lettuce, mushrooms, okra, onions, peas, peppers, potatoes, pumpkin, radishes, rutabaga, shallots, spinach, sweet potatoes, turnips, zucchini - Fruits common to the Mediterranean Diet include: apples, apricots, avocados, cherries, clementines, dates, figs, grapefruits, grapes, melons, nectarines, oranges, peaches, pears, pomegranates, strawberries, tangerines  Fats - Replace butter and margarine with healthy oils, such as olive oil, canola oil, and tahini  - Limit nuts to no more than a handful a day  - Nuts include walnuts, almonds, pecans, pistachios, pine nuts  - Limit or avoid candied, honey roasted or heavily salted nuts - Olives are central to the Mediterranean diet - can be eaten whole or used in a variety of dishes   Meats Protein - Limiting red meat: no more than a few times a month - When eating red meat: choose lean cuts and keep the portion to the size of deck of cards - Eggs: approx. 0 to 4 times a week  - Fish and lean poultry: at least 2 a week  - Healthy protein sources include, chicken, turkey, lean beef, lamb - Increase intake of seafood such as tuna, salmon, trout, mackerel, shrimp, scallops - Avoid or limit high fat processed meats such as sausage and bacon   Dairy - Include moderate amounts of low fat dairy products  - Focus on healthy dairy such as fat free yogurt, skim milk, low or reduced fat cheese - Limit dairy products higher in fat such as whole or 2% milk, cheese, ice cream  Alcohol - Moderate amounts of red wine is ok  - No more than 5 oz daily for women (all ages) and men older than age 65  - No more than 10 oz of wine daily for men younger than 65  Other - Limit sweets and other desserts  - Use herbs and spices instead of salt to flavor foods  - Herbs and spices common to the traditional Mediterranean Diet include: basil, bay leaves, chives, cloves, cumin, fennel, garlic, lavender, marjoram, mint, oregano, parsley, pepper, rosemary, sage, savory, sumac, tarragon, thyme   It's not just a diet, it's a lifestyle:  . The Mediterranean diet includes lifestyle factors typical of those in the region  . Foods, drinks and meals are best eaten with others and savored . Daily physical activity is important for overall good health . This could be strenuous exercise like running and aerobics . This could also be more leisurely activities such as walking, housework, yard-work, or taking the stairs . Moderation is the key; a balanced and healthy diet accommodates most foods and drinks . Consider portion sizes and frequency of consumption of certain foods   Meal Ideas & Options:  . Breakfast:  o Whole wheat toast or whole wheat English muffins with peanut butter & hard boiled egg o Steel cut oats topped with apples & cinnamon and skim milk  o Fresh fruit: banana, strawberries, melon, berries, peaches    o Smoothies: strawberries, bananas, greek yogurt, peanut butter o Low fat greek yogurt with blueberries and granola  o Egg white omelet with spinach and mushrooms o Breakfast couscous: whole wheat couscous, apricots, skim milk, cranberries  . Sandwiches:  o Hummus and grilled vegetables (peppers, zucchini, squash) on whole wheat bread   o Grilled  chicken on whole wheat pita with lettuce, tomatoes, cucumbers or tzatziki  o Tuna salad on whole wheat bread: tuna salad made with greek yogurt, olives, red peppers, capers, green onions o Garlic rosemary lamb pita: lamb sauted with garlic, rosemary, salt & pepper; add lettuce, cucumber, greek yogurt to pita - flavor with lemon juice and black pepper  . Seafood:  o Mediterranean grilled salmon, seasoned with garlic, basil, parsley, lemon juice and black pepper o Shrimp, lemon, and spinach whole-grain pasta salad made with low fat greek yogurt  o Seared scallops with lemon orzo  o Seared tuna steaks seasoned salt, pepper, coriander topped with tomato mixture of olives, tomatoes, olive oil, minced garlic, parsley, green onions and cappers  . Meats:  o Herbed greek chicken salad with kalamata olives, cucumber, feta  o Red bell peppers stuffed with spinach, bulgur, lean ground beef (or lentils) & topped with feta   o Kebabs: skewers of chicken, tomatoes, onions, zucchini, squash  o Turkey burgers: made with red onions, mint, dill, lemon juice, feta cheese topped with roasted red peppers . Vegetarian o Cucumber salad: cucumbers, artichoke hearts, celery, red onion, feta cheese, tossed in olive oil & lemon juice  o Hummus and whole grain pita points with a greek salad (lettuce, tomato, feta, olives, cucumbers, red onion) o Lentil soup with celery, carrots made with vegetable broth, garlic, salt and pepper  o Tabouli salad: parsley, bulgur, mint, scallions, cucumbers, tomato, radishes, lemon juice, olive oil, salt and pepper.       Fat and Cholesterol Restricted Eating Plan Eating a diet that limits fat and cholesterol may help lower your risk for heart disease and other conditions. Your body needs fat and cholesterol for basic functions, but eating too much of these things can be harmful to your health. Your health care provider may order lab tests to check your blood fat (lipid) and cholesterol  levels. This helps your health care provider understand your risk for certain conditions and whether you need to make diet changes. Work with your health care provider or dietitian to make an eating plan that is right for you. Your plan includes:  Limit your fat intake to ______% or less of your total calories a day.  Limit your saturated fat intake to ______% or less of your total calories a day.  Limit the amount of cholesterol in your diet to less than _________mg a day.  Eat ___________ g of fiber a day. What are tips for following this plan? General guidelines   If you are overweight, work with your health care provider to lose weight safely. Losing just 5-10% of your body weight can improve your overall health and help prevent diseases such as diabetes and heart disease.  Avoid: ? Foods with added sugar. ? Fried foods. ? Foods that contain partially hydrogenated oils, including stick margarine, some tub margarines, cookies, crackers, and other baked goods.  Limit alcohol intake to no more than 1 drink a day for nonpregnant women and 2 drinks a day for men. One drink equals 12 oz of beer, 5 oz of wine, or 1 oz of hard liquor. Reading food labels  Check food   labels for: ? Trans fats, partially hydrogenated oils, or high amounts of saturated fat. Avoid foods that contain saturated fat and trans fat. ? The amount of cholesterol in each serving. Try to eat no more than 200 mg of cholesterol each day. ? The amount of fiber in each serving. Try to eat at least 20-30 g of fiber each day.  Choose foods with healthy fats, such as: ? Monounsaturated and polyunsaturated fats. These include olive and canola oil, flaxseeds, walnuts, almonds, and seeds. ? Omega-3 fats. These are found in foods such as salmon, mackerel, sardines, tuna, flaxseed oil, and ground flaxseeds.  Choose grain products that have whole grains. Look for the word "whole" as the first word in the ingredient  list. Cooking  Cook foods using methods other than frying. Baking, boiling, grilling, and broiling are some healthy options.  Eat more home-cooked food and less restaurant, buffet, and fast food.  Avoid cooking using saturated fats. ? Animal sources of saturated fats include meats, butter, and cream. ? Plant sources of saturated fats include palm oil, palm kernel oil, and coconut oil. Meal planning   At meals, imagine dividing your plate into fourths: ? Fill one-half of your plate with vegetables and green salads. ? Fill one-fourth of your plate with whole grains. ? Fill one-fourth of your plate with lean protein foods.  Eat fish that is high in omega-3 fats at least two times a week.  Eat more foods that contain fiber, such as whole grains, beans, apples, broccoli, carrots, peas, and barley. These foods help promote healthy cholesterol levels in the blood. Recommended foods Grains  Whole grains, such as whole wheat or whole grain breads, crackers, cereals, and pasta. Unsweetened oatmeal, bulgur, barley, quinoa, or brown rice. Corn or whole wheat flour tortillas. Vegetables  Fresh or frozen vegetables (raw, steamed, roasted, or grilled). Green salads. Fruits  All fresh, canned (in natural juice), or frozen fruits. Meats and other protein foods  Ground beef (85% or leaner), grass-fed beef, or beef trimmed of fat. Skinless chicken or turkey. Ground chicken or turkey. Pork trimmed of fat. All fish and seafood. Egg whites. Dried beans, peas, or lentils. Unsalted nuts or seeds. Unsalted canned beans. Natural nut butters without added sugar and oil. Dairy  Low-fat or nonfat dairy products, such as skim or 1% milk, 2% or reduced-fat cheeses, low-fat and fat-free ricotta or cottage cheese, or plain low-fat and nonfat yogurt. Fats and oils  Tub margarine without trans fats. Light or reduced-fat mayonnaise and salad dressings. Avocado. Olive, canola, sesame, or safflower oils. The items  listed above may not be a complete list of recommended foods or beverages. Contact your dietitian for more options. Foods to avoid Grains  White bread. White pasta. White rice. Cornbread. Bagels, pastries, and croissants. Crackers and snack foods that contain trans fat and hydrogenated oils. Vegetables  Vegetables cooked in cheese, cream, or butter sauce. Fried vegetables. Fruits  Canned fruit in heavy syrup. Fruit in cream or butter sauce. Fried fruit. Meats and other protein foods  Fatty cuts of meat. Ribs, chicken wings, bacon, sausage, bologna, salami, chitterlings, fatback, hot dogs, bratwurst, and packaged lunch meats. Liver and organ meats. Whole eggs and egg yolks. Chicken and turkey with skin. Fried meat. Dairy  Whole or 2% milk, cream, half-and-half, and cream cheese. Whole milk cheeses. Whole-fat or sweetened yogurt. Full-fat cheeses. Nondairy creamers and whipped toppings. Processed cheese, cheese spreads, and cheese curds. Beverages  Alcohol. Sugar-sweetened drinks such as sodas, lemonade, and fruit   drinks. Fats and oils  Butter, stick margarine, lard, shortening, ghee, or bacon fat. Coconut, palm kernel, and palm oils. Sweets and desserts  Corn syrup, sugars, honey, and molasses. Candy. Jam and jelly. Syrup. Sweetened cereals. Cookies, pies, cakes, donuts, muffins, and ice cream. The items listed above may not be a complete list of foods and beverages to avoid. Contact your dietitian for more information. Summary  Your body needs fat and cholesterol for basic functions. However, eating too much of these things can be harmful to your health.  Work with your health care provider and dietitian to follow a diet low in fat and cholesterol. Doing this may help lower your risk for heart disease and other conditions.  Choose healthy fats, such as monounsaturated and polyunsaturated fats, and foods high in omega-3 fatty acids.  Eat fiber-rich foods, such as whole grains,  beans, peas, fruits, and vegetables.  Limit or avoid alcohol, fried foods, and foods high in saturated fats, partially hydrogenated oils, and sugar. This information is not intended to replace advice given to you by your health care provider. Make sure you discuss any questions you have with your health care provider. Document Released: 01/04/2005 Document Revised: 12/17/2016 Document Reviewed: 09/21/2016 Elsevier Patient Education  2020 Elsevier Inc.  American Heart Association (AHA) Exercise Recommendation  Being physically active is important to prevent heart disease and stroke, the nation's No. 1and No. 5killers. To improve overall cardiovascular health, we suggest at least 150 minutes per week of moderate exercise or 75 minutes per week of vigorous exercise (or a combination of moderate and vigorous activity). Thirty minutes a day, five times a week is an easy goal to remember. You will also experience benefits even if you divide your time into two or three segments of 10 to 15 minutes per day.  For people who would benefit from lowering their blood pressure or cholesterol, we recommend 40 minutes of aerobic exercise of moderate to vigorous intensity three to four times a week to lower the risk for heart attack and stroke.  Physical activity is anything that makes you move your body and burn calories.  This includes things like climbing stairs or playing sports. Aerobic exercises benefit your heart, and include walking, jogging, swimming or biking. Strength and stretching exercises are best for overall stamina and flexibility.  The simplest, positive change you can make to effectively improve your heart health is to start walking. It's enjoyable, free, easy, social and great exercise. A walking program is flexible and boasts high success rates because people can stick with it. It's easy for walking to become a regular and satisfying part of life.   For Overall Cardiovascular Health:  At  least 30 minutes of moderate-intensity aerobic activity at least 5 days per week for a total of 150  OR   At least 25 minutes of vigorous aerobic activity at least 3 days per week for a total of 75 minutes; or a combination of moderate- and vigorous-intensity aerobic activity  AND   Moderate- to high-intensity muscle-strengthening activity at least 2 days per week for additional health benefits.  For Lowering Blood Pressure and Cholesterol  An average 40 minutes of moderate- to vigorous-intensity aerobic activity 3 or 4 times per week  What if I can't make it to the time goal? Something is always better than nothing! And everyone has to start somewhere. Even if you've been sedentary for years, today is the day you can begin to make healthy changes in your life. If   you don't think you'll make it for 30 or 40 minutes, set a reachable goal for today. You can work up toward your overall goal by increasing your time as you get stronger. Don't let all-or-nothing thinking rob you of doing what you can every day.  Source:http://www.heart.org    

## 2018-12-07 LAB — LIPID PANEL
Chol/HDL Ratio: 3 ratio (ref 0.0–5.0)
Cholesterol, Total: 144 mg/dL (ref 100–199)
HDL: 48 mg/dL (ref >39)
LDL Chol Calc (NIH): 86 mg/dL (ref 0–99)
Triglycerides: 47 mg/dL (ref 0–149)
VLDL Cholesterol Cal: 10 mg/dL (ref 5–40)

## 2018-12-07 LAB — COMPREHENSIVE METABOLIC PANEL
ALT: 9 IU/L (ref 0–44)
AST: 14 IU/L (ref 0–40)
Albumin/Globulin Ratio: 1.5 (ref 1.2–2.2)
Albumin: 4.1 g/dL (ref 3.8–4.9)
Alkaline Phosphatase: 145 IU/L — ABNORMAL HIGH (ref 39–117)
BUN/Creatinine Ratio: 13 (ref 9–20)
BUN: 13 mg/dL (ref 6–24)
Bilirubin Total: 0.8 mg/dL (ref 0.0–1.2)
CO2: 20 mmol/L (ref 20–29)
Calcium: 9.6 mg/dL (ref 8.7–10.2)
Chloride: 105 mmol/L (ref 96–106)
Creatinine, Ser: 0.97 mg/dL (ref 0.76–1.27)
GFR calc Af Amer: 99 mL/min/{1.73_m2} (ref >59)
GFR calc non Af Amer: 86 mL/min/{1.73_m2} (ref >59)
Globulin, Total: 2.7 g/dL (ref 1.5–4.5)
Glucose: 87 mg/dL (ref 65–99)
Potassium: 4.2 mmol/L (ref 3.5–5.2)
Sodium: 139 mmol/L (ref 134–144)
Total Protein: 6.8 g/dL (ref 6.0–8.5)

## 2018-12-07 LAB — CBC WITH DIFFERENTIAL/PLATELET
Basophils Absolute: 0 10*3/uL (ref 0.0–0.2)
Basos: 0 %
EOS (ABSOLUTE): 0.2 10*3/uL (ref 0.0–0.4)
Eos: 2 %
Hematocrit: 40.2 % (ref 37.5–51.0)
Hemoglobin: 13.7 g/dL (ref 13.0–17.7)
Immature Grans (Abs): 0 10*3/uL (ref 0.0–0.1)
Immature Granulocytes: 0 %
Lymphocytes Absolute: 1.4 10*3/uL (ref 0.7–3.1)
Lymphs: 18 %
MCH: 27.7 pg (ref 26.6–33.0)
MCHC: 34.1 g/dL (ref 31.5–35.7)
MCV: 81 fL (ref 79–97)
Monocytes Absolute: 0.7 10*3/uL (ref 0.1–0.9)
Monocytes: 9 %
Neutrophils Absolute: 5.6 10*3/uL (ref 1.4–7.0)
Neutrophils: 71 %
Platelets: 216 10*3/uL (ref 150–450)
RBC: 4.94 x10E6/uL (ref 4.14–5.80)
RDW: 13.3 % (ref 11.6–15.4)
WBC: 7.9 10*3/uL (ref 3.4–10.8)

## 2018-12-07 LAB — URIC ACID: Uric Acid: 7 mg/dL (ref 3.7–8.6)

## 2018-12-07 LAB — HEMOGLOBIN A1C
Est. average glucose Bld gHb Est-mCnc: 114 mg/dL
Hgb A1c MFr Bld: 5.6 % (ref 4.8–5.6)

## 2018-12-07 LAB — TSH: TSH: 1.51 u[IU]/mL (ref 0.450–4.500)

## 2018-12-09 ENCOUNTER — Other Ambulatory Visit: Payer: Self-pay

## 2018-12-09 ENCOUNTER — Ambulatory Visit
Admission: RE | Admit: 2018-12-09 | Discharge: 2018-12-09 | Disposition: A | Discharge status: Home / Self Care | Payer: Managed Care, Other (non HMO) | Source: Ambulatory Visit | Attending: Orthopedic Surgery | Admitting: Orthopedic Surgery

## 2018-12-09 DIAGNOSIS — M79672 Pain in left foot: Secondary | ICD-10-CM

## 2018-12-11 NOTE — Progress Notes (Signed)
Established Patient Office Visit  Subjective:  Patient ID: Joseph Tyler, male    DOB: 09/22/1960  Age: 58 y.o. MRN: [id number]  CC:  Chief Complaint  Patient presents with  . Annual Exam    HPI Joseph Tyler presents for visit to establish care and CPE  No urgent complaints today. Feels well.   Past Medical History:  Diagnosis Date  . HTN (hypertension)    on and off since 2002  . Hyperlipidemia   . Kidney stone     History reviewed. No pertinent surgical history.  Family History  Problem Relation Age of Onset  . Colon cancer Neg Hx   . Esophageal cancer Neg Hx   . Inflammatory bowel disease Neg Hx   . Liver disease Neg Hx   . Pancreatic cancer Neg Hx   . Rectal cancer Neg Hx   . Stomach cancer Neg Hx     Social History   Socioeconomic History  . Marital status: Married    Spouse name: Joseph Tyler  . Number of children: 4  . Years of education: Joseph Tyler  . Highest education level: Joseph Tyler  Occupational History  . Joseph Tyler  Social Needs  . Financial resource strain: Joseph hard at all  . Food insecurity    Worry: Never true    Inability: Never true  . Transportation needs    Medical: No    Non-medical: No  Tobacco Use  . Smoking status: Never Smoker  . Smokeless tobacco: Never Used  Substance and Sexual Activity  . Alcohol use: No  . Drug use: No  . Sexual activity: Yes    Partners: Female    Birth control/protection: Post-menopausal  Lifestyle  . Physical activity    Days per week: 0 days    Minutes per session: 0 min  . Stress: Joseph at all  Relationships  . Social Herbalist on phone: Three times a week    Gets together: Three times a week    Attends religious service: 1 to 4 times per year    Active member of club or organization: No    Attends meetings of clubs or organizations: Never    Relationship status: Married  . Intimate partner violence    Fear of current or ex partner: No    Emotionally  abused: No    Physically abused: No    Forced sexual activity: No  Other Topics Concern  . Joseph Tyler  Social History Narrative   Married, 4 children ages 24, 58, 78, 52   Works for a [employer]; has 2 jobs.    Daily caffeine use - 4/day     Outpatient Medications Prior to Visit  Medication Sig Dispense Refill  . alum & mag hydroxide-simeth (MAALOX ADVANCED MAX ST) 400-400-40 MG/5ML suspension Take 15 mLs by mouth every 6 (six) hours as needed for indigestion. 355 mL 0  . ibuprofen (ADVIL) 200 MG tablet Take by mouth.    Marland Kitchen lisinopril (ZESTRIL) 10 MG tablet TAKE 1 TABLET (10 MG TOTAL) BY MOUTH DAILY. ESTABLISH WITH NEW PCP 30 tablet 0  . sildenafil (REVATIO) 20 MG tablet Take by mouth.     Facility-Administered Medications Prior to Visit  Medication Dose Route Frequency Provider Last Rate Last Dose  . cyanocobalamin ((VITAMIN B-12)) injection 1,000 mcg  1,000 mcg Intramuscular Q30 days Posey Boyer, MD   1,000 mcg at 05/01/14 1058  Allergies  Allergen Reactions  . Other     Eggplant- swelling     ROS Review of Systems  Constitutional: Negative.   HENT: Negative.   Eyes: Negative.   Respiratory: Negative.   Cardiovascular: Negative.   Gastrointestinal: Negative.   Endocrine: Negative.   Genitourinary: Negative.   Musculoskeletal: Negative.   Skin: Negative.   Allergic/Immunologic: Negative.   Neurological: Negative.   Hematological: Negative.   Psychiatric/Behavioral: Negative.   All other systems reviewed and are negative.     Objective:    Physical Exam  Constitutional: He is oriented to person, place, and time. He appears well-developed and well-nourished. No distress.  HENT:  Head: Normocephalic and atraumatic.  Right Ear: External ear normal.  Left Ear: External ear normal.  Nose: Nose normal.  Mouth/Throat: Oropharynx is clear and moist. No oropharyngeal exudate.  Eyes: Pupils are equal, round, and reactive to light. Conjunctivae  and EOM are normal. Right eye exhibits no discharge. Left eye exhibits no discharge. No scleral icterus.  Neck: Normal range of motion. Neck supple. No tracheal deviation present. No thyromegaly present.  Cardiovascular: Normal rate, regular rhythm, normal heart sounds and intact distal pulses. Exam reveals no gallop and no friction rub.  No murmur heard. Pulmonary/Chest: Effort normal and breath sounds normal. No respiratory distress. He has no wheezes. He has no rales. He exhibits no tenderness.  Abdominal: Soft. Bowel sounds are normal. He exhibits no distension and no mass. There is no abdominal tenderness. There is no rebound and no guarding.  Musculoskeletal: Normal range of motion.        General: No tenderness, deformity or edema.  Lymphadenopathy:    He has no cervical adenopathy.  Neurological: He is alert and oriented to person, place, and time. No cranial nerve deficit. He exhibits normal muscle tone. Coordination normal.  Skin: Skin is warm and dry. No rash noted. He is Joseph diaphoretic. No erythema. No pallor.  Psychiatric: He has a normal mood and affect. His behavior is normal. Judgment and thought content normal.  Nursing note and vitals reviewed.   BP 123/80   Pulse 66   Temp 98.2 F (36.8 C) (Oral)   Resp 16   Ht 5' 8.5" (1.74 m)   Wt 156 lb (70.8 kg)   SpO2 99%   BMI 23.37 kg/m  Wt Readings from Last 3 Encounters:  12/06/18 156 lb (70.8 kg)  03/01/18 157 lb (71.2 kg)  01/30/18 157 lb 9.6 oz (71.5 kg)     Health Maintenance Due  Topic Date Due  . Hepatitis C Screening  March 26, 1960  . HIV Screening  01/19/1975  . TETANUS/TDAP  01/19/1979    There are no preventive care reminders to display for this patient.  Lab Results  Component Value Date   TSH 1.510 12/06/2018   Lab Results  Component Value Date   WBC 7.9 12/06/2018   HGB 13.7 12/06/2018   HCT 40.2 12/06/2018   MCV 81 12/06/2018   PLT 216 12/06/2018   Lab Results  Component Value Date   NA  139 12/06/2018   K 4.2 12/06/2018   CO2 20 12/06/2018   GLUCOSE 87 12/06/2018   BUN 13 12/06/2018   CREATININE 0.97 12/06/2018   BILITOT 0.8 12/06/2018   ALKPHOS 145 (H) 12/06/2018   AST 14 12/06/2018   ALT 9 12/06/2018   PROT 6.8 12/06/2018   ALBUMIN 4.1 12/06/2018   CALCIUM 9.6 12/06/2018   ANIONGAP 9 02/03/2018   Lab Results  Component  Value Date   CHOL 144 12/06/2018   Lab Results  Component Value Date   HDL 48 12/06/2018   Lab Results  Component Value Date   LDLCALC 86 12/06/2018   Lab Results  Component Value Date   TRIG 47 12/06/2018   Lab Results  Component Value Date   CHOLHDL 3.0 12/06/2018   Lab Results  Component Value Date   HGBA1C 5.6 12/06/2018      Assessment & Plan:   Problem List Items Addressed This Visit    None    Visit Diagnoses    Screening for endocrine, metabolic and immunity disorder    -  Primary   Relevant Orders   CBC with Differential (Completed)   Comprehensive metabolic panel (Completed)   Hemoglobin A1c (Completed)   TSH (Completed)   Lipid screening       Relevant Orders   Lipid panel (Completed)   Great toe pain, left       Relevant Orders   Uric Acid (Completed)   Routine general medical examination at a health care facility          No orders of the defined types were placed in this encounter.   Follow-up: Return in 1 year (on 12/06/2019).   PLAN  Normal findings on exam  When discussing exam with patient, he notes nocturia. Asking for referral to urology  Labs drawn, will follow up as warranted  Patient encouraged to call clinic with any questions, comments, or concerns.   Maximiano Coss, NP

## 2018-12-11 NOTE — Progress Notes (Signed)
interesting

## 2018-12-13 ENCOUNTER — Other Ambulatory Visit: Payer: Self-pay

## 2018-12-13 ENCOUNTER — Ambulatory Visit: Payer: Managed Care, Other (non HMO) | Admitting: Orthopedic Surgery

## 2018-12-13 ENCOUNTER — Telehealth: Payer: Self-pay

## 2018-12-13 DIAGNOSIS — M87875 Other osteonecrosis, left foot: Secondary | ICD-10-CM | POA: Diagnosis not present

## 2018-12-13 NOTE — Telephone Encounter (Signed)
Please call patient to set up an appt with Dr Sharol Given to discuss MRI per Dr Marlou Sa.

## 2018-12-17 ENCOUNTER — Encounter: Payer: Self-pay | Admitting: Orthopedic Surgery

## 2018-12-17 NOTE — Progress Notes (Signed)
Office Visit Note   Patient: Joseph Tyler           Date of Birth: 01-15-1961           MRN: [id number] Visit Date: 12/13/2018 Requested by: Shawnee Knapp, MD [place],  [place] [id number] PCP: Maximiano Coss, NP  Subjective: Chief Complaint  Patient presents with  . Left Foot - Follow-up    HPI: Joseph Tyler is a 58 y.o. male who presents to the office complaining of left foot pain.  Patient returns to discuss MRI results.  He notes no change in the quality of his foot pain.  He does note that it has improved since his last office visit.  He localizes it to the medial midfoot.  Patient works in [employer] for the city of [employer] and states that he is on his feet constantly at work.  Want to return to work without worrying about pain.Marland Kitchen He does have some days where he has no pain and some days after he has been on his feet he has significant pain.  He does work in fairly rigid type shoes.              ROS:  All systems reviewed are negative as they relate to the chief complaint within the history of present illness.  Patient denies fevers or chills.  Assessment & Plan: Visit Diagnoses:  1. Other osteonecrosis, left foot ([place])     Plan: Patient is a 58 year old male who presents with left foot pain.  Pain is been ongoing for 4 months.  MRI of the left foot reveals osteonecrosis of the medial cuneiform.  Discussed the potential long-term outcome of this pathologic process.  The patient's pain has been improving.  Referred patient to Dr. Sharol Given for further evaluation and management.  He may need some type of arch support or potentially selective midfoot fusions in order to facilitate long-term solution to this problem.  Also reviewed patient's recent DEXA scan results.  Patient agrees with plan.  Follow-Up Instructions: No follow-ups on file.   Orders:  No orders of the defined types were placed in this encounter.  No orders of the defined  types were placed in this encounter.     Procedures: No procedures performed   Clinical Data: No additional findings.  Objective: Vital Signs: There were no vitals taken for this visit.  Physical Exam:  Constitutional: Patient appears well-developed HEENT:  Head: Normocephalic Eyes:EOM are normal Neck: Normal range of motion Cardiovascular: Normal rate Pulmonary/chest: Effort normal Neurologic: Patient is alert Skin: Skin is warm Psychiatric: Patient has normal mood and affect  Ortho Exam:  Left foot exam 2+ DP pulse Sensation intact through all dermatomes of the left foot Active dorsiflexion, plantarflexion, eversion, inversion intact No tenderness to palpation throughout the forefoot.  No tenderness to palpation in the retrocalcaneal space, along the Achilles tendon, fifth metatarsal base, medial malleolus, lateral malleolus, plantar fascia TTP over the medial cuneiform  Specialty Comments:  No specialty comments available.  Imaging: No results found.   PMFS History: Patient Active Problem List   Diagnosis Date Noted  . Abdominal pain, chronic, right upper quadrant 03/06/2018  . Constipation 03/06/2018  . 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 Fatigue 05/01/2014  . Myalgia 05/01/2014  . 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 Vitamin B12 deficiency 10/29/2013  . Erectile dysfunction 03/29/2012  . Tongue pain 06/08/2011  . ESSENTIAL HYPERTENSION, BENIGN 02/09/2008  . CHEST PAIN 02/09/2008  .  ABNORMAL ELECTROCARDIOGRAM 02/09/2008   Past Medical History:  Diagnosis Date  . HTN (hypertension)    on and off since 2002  . Hyperlipidemia   . Kidney stone     Family History  Problem Relation Age of Onset  . Colon cancer Neg Hx   . Esophageal cancer Neg Hx   . Inflammatory bowel disease Neg Hx   . Liver disease Neg Hx   . Pancreatic cancer Neg Hx   . Rectal cancer Neg Hx   . Stomach cancer Neg Hx     No past surgical history on file. Social History    Occupational History  . Not on file  Tobacco Use  . Smoking status: Never Smoker  . Smokeless tobacco: Never Used  Substance and Sexual Activity  . Alcohol use: No  . Drug use: No  . Sexual activity: Yes    Partners: Female    Birth control/protection: Post-menopausal

## 2018-12-21 ENCOUNTER — Encounter: Payer: Self-pay | Admitting: Orthopedic Surgery

## 2018-12-21 ENCOUNTER — Other Ambulatory Visit: Payer: Self-pay

## 2018-12-21 ENCOUNTER — Ambulatory Visit: Payer: Managed Care, Other (non HMO) | Admitting: Orthopedic Surgery

## 2018-12-21 VITALS — Ht 68.5 in | Wt 156.0 lb

## 2018-12-21 DIAGNOSIS — M87076 Idiopathic aseptic necrosis of unspecified foot: Secondary | ICD-10-CM | POA: Diagnosis not present

## 2018-12-21 DIAGNOSIS — M79672 Pain in left foot: Secondary | ICD-10-CM

## 2018-12-21 NOTE — Progress Notes (Signed)
Office Visit Note   Patient: Joseph Tyler           Date of Birth: 12-Apr-1960           MRN: [id number] Visit Date: 12/21/2018              Requested by: Maximiano Coss, NP [place],  [place] [id number] PCP: Maximiano Coss, NP  Chief Complaint  Patient presents with  . Left Foot - Pain, Follow-up    MRI Review per Dr Marlou Sa Left foot      HPI: Patient is a 58 year old gentleman who was seen for initial evaluation for Dr. Marlou Sa.  Patient states he has had a 10-year history of pain through the midfoot.  He is status post an MRI scan.  Patient states he has not had a lot of medical care but states he has had a family history of sickle cell trait from [place].  Assessment & Plan: Visit Diagnoses: No diagnosis found.  Plan: We will start a medical work-up we will draw a vitamin D3 and B12 level drawn a hemoglobin A1c as well as a c-Met and CBC as well as a sickle cell study.  At a minimum recommend the patient take 2000 international units of vitamin D3 a day.  Follow-Up Instructions: Return in about 4 weeks (around 01/18/2019).   Ortho Exam  Patient is alert, oriented, no adenopathy, well-dressed, normal affect, normal respiratory effort. Examination patient has an excellent dorsalis pedis pulse he has good ankle good subtalar motion there is no redness no swelling no signs of infection no ulceration.  Patient is tender to palpation of the medial cuneiform.  Review of the MRI scan does show avascular necrosis of the medial cuneiform but there is no bone growth development abnormalities this appears to be something that is started in his adulthood not from childhood vascular injury.  This most likely may be due to his sickle cell trait will follow up with the laboratory studies.  Imaging: No results found. No images are attached to the encounter.  Labs: Lab Results  Component Value Date   HGBA1C 5.6 12/06/2018   HGBA1C 5.7 (H) 12/08/2016   HGBA1C 5.2 05/01/2014    ESRSEDRATE 19 12/02/2016   ESRSEDRATE 12 10/28/2013   LABURIC 7.0 12/06/2018   LABORGA NO GROWTH 03/26/2013     Lab Results  Component Value Date   ALBUMIN 4.1 12/06/2018   ALBUMIN 4.1 03/01/2018   ALBUMIN 3.3 (L) 02/03/2018   LABURIC 7.0 12/06/2018    No results found for: MG No results found for: VD25OH  No results found for: PREALBUMIN CBC EXTENDED Latest Ref Rng & Units 12/06/2018 02/03/2018 01/30/2018  WBC 3.4 - 10.8 x10E3/uL 7.9 6.8 5.9  RBC 4.14 - 5.80 x10E6/uL 4.94 4.72 5.09  HGB 13.0 - 17.7 g/dL 13.7 13.2 13.8  HCT 37.5 - 51.0 % 40.2 38.8(L) 42.2(A)  PLT 150 - 450 x10E3/uL 216 256 -  NEUTROABS 1.4 - 7.0 x10E3/uL 5.6 - -  LYMPHSABS 0.7 - 3.1 x10E3/uL 1.4 - -     Body mass index is 23.37 kg/m.  Orders:  No orders of the defined types were placed in this encounter.  No orders of the defined types were placed in this encounter.    Procedures: No procedures performed  Clinical Data: No additional findings.  ROS:  All other systems negative, except as noted in the HPI. Review of Systems  Objective: Vital Signs: Ht 5' 8.5" (1.74 m)   Wt 156  lb (70.8 kg)   BMI 23.37 kg/m   Specialty Comments:  No specialty comments available.  PMFS History: Patient Active Problem List   Diagnosis Date Noted  . Abdominal pain, chronic, right upper quadrant 03/06/2018  . Constipation 03/06/2018  . 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 Fatigue 05/01/2014  . Myalgia 05/01/2014  . 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 Vitamin B12 deficiency 10/29/2013  . Erectile dysfunction 03/29/2012  . Tongue pain 06/08/2011  . ESSENTIAL HYPERTENSION, BENIGN 02/09/2008  . CHEST PAIN 02/09/2008  . ABNORMAL ELECTROCARDIOGRAM 02/09/2008   Past Medical History:  Diagnosis Date  . HTN (hypertension)    on and off since 2002  . Hyperlipidemia   . Kidney stone     Family History  Problem Relation Age of Onset  . Colon cancer Neg Hx   . Esophageal cancer Neg Hx   . Inflammatory  bowel disease Neg Hx   . Liver disease Neg Hx   . Pancreatic cancer Neg Hx   . Rectal cancer Neg Hx   . Stomach cancer Neg Hx     History reviewed. No pertinent surgical history. Social History   Occupational History  . Not on file  Tobacco Use  . Smoking status: Never Smoker  . Smokeless tobacco: Never Used  Substance and Sexual Activity  . Alcohol use: No  . Drug use: No  . Sexual activity: Yes    Partners: Female    Birth control/protection: Post-menopausal

## 2018-12-21 NOTE — Addendum Note (Signed)
Addended by: Pamella Pert on: 12/21/2018 02:45 PM   Modules accepted: Orders

## 2018-12-25 ENCOUNTER — Other Ambulatory Visit: Payer: Self-pay | Admitting: Registered Nurse

## 2018-12-25 DIAGNOSIS — I10 Essential (primary) hypertension: Secondary | ICD-10-CM

## 2019-01-18 ENCOUNTER — Ambulatory Visit: Payer: Managed Care, Other (non HMO) | Admitting: Orthopedic Surgery

## 2019-01-25 ENCOUNTER — Encounter: Payer: Self-pay | Admitting: Orthopedic Surgery

## 2019-01-25 ENCOUNTER — Ambulatory Visit (INDEPENDENT_AMBULATORY_CARE_PROVIDER_SITE_OTHER): Payer: Managed Care, Other (non HMO) | Admitting: Orthopedic Surgery

## 2019-01-25 ENCOUNTER — Other Ambulatory Visit: Payer: Self-pay

## 2019-01-25 VITALS — Ht 68.0 in | Wt 156.0 lb

## 2019-01-25 DIAGNOSIS — M87076 Idiopathic aseptic necrosis of unspecified foot: Secondary | ICD-10-CM

## 2019-01-25 NOTE — Progress Notes (Signed)
Office Visit Note   Patient: Joseph Tyler           Date of Birth: 05-25-1960           MRN: [id number] Visit Date: 01/25/2019              Requested by: Maximiano Coss, NP [place],  [place] [address] PCP: Maximiano Coss, NP  Chief Complaint  Patient presents with  . Left Foot - Follow-up      HPI: Patient is a 59 year old gentleman is seen in follow-up for his left foot.  Patient states that he has not been very active recently and the pain has resolved.  Patient denies any swelling or redness.  Assessment & Plan: Visit Diagnoses:  1. Avascular necrosis of bone of foot ([place])     Plan: Would continue with conservative treatment recommended a stiff soled sneaker to decrease stress across the midfoot recommended sole orthotics.  Discussed with the patient he did have a slightly elevated uric acid and he does have sickle cell trait which is most likely the cause of the avascular necrosis of the medial cuneiform.  Follow-Up Instructions: Return if symptoms worsen or fail to improve.   Ortho Exam  Patient is alert, oriented, no adenopathy, well-dressed, normal affect, normal respiratory effort. Examination patient has good pulses good ankle good subtalar motion he can do a single limb heel raise without pain.  There is no redness no cellulitis no swelling.  Patient has minimal tenderness to palpation along the medial column.  Previous MRI scan did show AVN of the medial navicular.  Review of the labs shows a hemoglobin A1c of 5.4 uric acid of 7.1 albumin of 3.8 and sickle cell trait is positive.  Imaging: No results found. No images are attached to the encounter.  Labs: Lab Results  Component Value Date   HGBA1C 5.6 12/06/2018   HGBA1C 5.7 (H) 12/08/2016   HGBA1C 5.2 05/01/2014   ESRSEDRATE 19 12/02/2016   ESRSEDRATE 12 10/28/2013   LABURIC 7.0 12/06/2018   LABORGA NO GROWTH 03/26/2013     Lab Results  Component Value Date   ALBUMIN 4.1  12/06/2018   ALBUMIN 4.1 03/01/2018   ALBUMIN 3.3 (L) 02/03/2018   LABURIC 7.0 12/06/2018    No results found for: MG No results found for: VD25OH  No results found for: PREALBUMIN CBC EXTENDED Latest Ref Rng & Units 12/06/2018 02/03/2018 01/30/2018  WBC 3.4 - 10.8 x10E3/uL 7.9 6.8 5.9  RBC 4.14 - 5.80 x10E6/uL 4.94 4.72 5.09  HGB 13.0 - 17.7 g/dL 13.7 13.2 13.8  HCT 37.5 - 51.0 % 40.2 38.8(L) 42.2(A)  PLT 150 - 450 x10E3/uL 216 256 -  NEUTROABS 1.4 - 7.0 x10E3/uL 5.6 - -  LYMPHSABS 0.7 - 3.1 x10E3/uL 1.4 - -     Body mass index is 23.72 kg/m.  Orders:  No orders of the defined types were placed in this encounter.  No orders of the defined types were placed in this encounter.    Procedures: No procedures performed  Clinical Data: No additional findings.  ROS:  All other systems negative, except as noted in the HPI. Review of Systems  Objective: Vital Signs: Ht 5\' 8"  (1.727 m)   Wt 156 lb (70.8 kg)   BMI 23.72 kg/m   Specialty Comments:  No specialty comments available.  PMFS History: Patient Active Problem List   Diagnosis Date Noted  . Abdominal pain, chronic, right upper quadrant 03/06/2018  . Constipation 03/06/2018  .  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 Fatigue 05/01/2014  . Myalgia 05/01/2014  . 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 Vitamin B12 deficiency 10/29/2013  . Erectile dysfunction 03/29/2012  . Tongue pain 06/08/2011  . ESSENTIAL HYPERTENSION, BENIGN 02/09/2008  . CHEST PAIN 02/09/2008  . ABNORMAL ELECTROCARDIOGRAM 02/09/2008   Past Medical History:  Diagnosis Date  . HTN (hypertension)    on and off since 2002  . Hyperlipidemia   . Kidney stone     Family History  Problem Relation Age of Onset  . Colon cancer Neg Hx   . Esophageal cancer Neg Hx   . Inflammatory bowel disease Neg Hx   . Liver disease Neg Hx   . Pancreatic cancer Neg Hx   . Rectal cancer Neg Hx   . Stomach cancer Neg Hx     History reviewed. No pertinent  surgical history. Social History   Occupational History  . Not on file  Tobacco Use  . Smoking status: Never Smoker  . Smokeless tobacco: Never Used  Substance and Sexual Activity  . Alcohol use: No  . Drug use: No  . Sexual activity: Yes    Partners: Female    Birth control/protection: Post-menopausal

## 2019-01-29 ENCOUNTER — Other Ambulatory Visit: Payer: Self-pay | Admitting: Registered Nurse

## 2019-01-29 DIAGNOSIS — I10 Essential (primary) hypertension: Secondary | ICD-10-CM

## 2019-03-03 ENCOUNTER — Other Ambulatory Visit: Payer: Self-pay | Admitting: Registered Nurse

## 2019-03-03 DIAGNOSIS — I10 Essential (primary) hypertension: Secondary | ICD-10-CM

## 2019-03-07 ENCOUNTER — Other Ambulatory Visit: Payer: Self-pay | Admitting: Registered Nurse

## 2019-03-07 DIAGNOSIS — I10 Essential (primary) hypertension: Secondary | ICD-10-CM

## 2019-03-07 NOTE — Telephone Encounter (Signed)
Medication refill: lisinopril (ZESTRIL) 10 MG tablet [id number]    Pt would like more than 1 month called in.    Pharmacy:  [id number] [address], [place] Phone:  [phone]  Fax:  [phone]

## 2019-04-26 ENCOUNTER — Other Ambulatory Visit: Payer: Self-pay | Admitting: Registered Nurse

## 2019-04-26 DIAGNOSIS — I10 Essential (primary) hypertension: Secondary | ICD-10-CM

## 2019-05-05 ENCOUNTER — Encounter (HOSPITAL_COMMUNITY): Payer: Self-pay | Admitting: Emergency Medicine

## 2019-05-05 ENCOUNTER — Emergency Department (HOSPITAL_COMMUNITY)
Admission: EM | Admit: 2019-05-05 | Discharge: 2019-05-05 | Disposition: A | Discharge status: Home / Self Care | Payer: Managed Care, Other (non HMO) | Attending: Emergency Medicine | Admitting: Emergency Medicine

## 2019-05-05 ENCOUNTER — Other Ambulatory Visit: Payer: Self-pay

## 2019-05-05 DIAGNOSIS — Y999 Unspecified external cause status: Secondary | ICD-10-CM | POA: Insufficient documentation

## 2019-05-05 DIAGNOSIS — Y92410 Unspecified street and highway as the place of occurrence of the external cause: Secondary | ICD-10-CM | POA: Insufficient documentation

## 2019-05-05 DIAGNOSIS — Z79899 Other long term (current) drug therapy: Secondary | ICD-10-CM | POA: Insufficient documentation

## 2019-05-05 DIAGNOSIS — S161XXA Strain of muscle, fascia and tendon at neck level, initial encounter: Secondary | ICD-10-CM

## 2019-05-05 DIAGNOSIS — Y939 Activity, unspecified: Secondary | ICD-10-CM | POA: Diagnosis not present

## 2019-05-05 DIAGNOSIS — I10 Essential (primary) hypertension: Secondary | ICD-10-CM | POA: Diagnosis not present

## 2019-05-05 DIAGNOSIS — S199XXA Unspecified injury of neck, initial encounter: Secondary | ICD-10-CM | POA: Diagnosis present

## 2019-05-05 MED ORDER — METHOCARBAMOL 500 MG PO TABS: 500.0000 mg | ORAL_TABLET | Freq: Two times a day (BID) | ORAL | 0 refills | Status: DC | PRN

## 2019-05-05 MED ORDER — ACETAMINOPHEN 500 MG PO TABS: 500.0000 mg | ORAL_TABLET | Freq: Four times a day (QID) | ORAL | 0 refills | Status: DC | PRN

## 2019-05-05 MED ORDER — HYDROCODONE-ACETAMINOPHEN 5-325 MG PO TABS
1.0000 | ORAL_TABLET | Freq: Once | ORAL | Status: AC
  Administered 2019-05-05: 08:00:00 1 via ORAL
  Filled 2019-05-05: qty 1

## 2019-05-05 NOTE — Discharge Instructions (Signed)
1. Medications: Ibuprofen or Tylenol for pain 2. Treatment: Rest, can use ice on head. Recommend that you remain in a quiet, not simulating, dark environment. No TV, computer use, video games until headache is resolved completely. No contact sports until cleared by your pediatrician or PCP. 3. Follow Up: With primary care physician on Monday if headache persists.  Return to the emergency department if you become lethargic, begin vomiting, develop double vision, speech difficulty, problems walking, or other change in mental status.  You were given a prescription for Robaxin which is a muscle relaxer.  You should not drive, work, consume alcohol, or operate machinery while taking this medication as it can make you very drowsy.    You were given narcotic and or sedative medications while in the emergency department. Do not drive. Do not use machinery or power tools. Do not sign legal documents. Do not drink alcohol. Do not take sleeping pills. Do not supervise children by yourself. Do not participate in activities that require climbing or being in high places.  Return to the ED or seek immediate medical attention should you experience any new or worsening symptoms.
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
## 2019-05-09 ENCOUNTER — Telehealth: Payer: Managed Care, Other (non HMO) | Admitting: Registered Nurse

## 2019-05-09 ENCOUNTER — Other Ambulatory Visit: Payer: Self-pay

## 2019-05-11 ENCOUNTER — Ambulatory Visit: Payer: Managed Care, Other (non HMO) | Admitting: Registered Nurse

## 2019-05-11 ENCOUNTER — Encounter: Payer: Self-pay | Admitting: Registered Nurse

## 2019-05-11 ENCOUNTER — Other Ambulatory Visit: Payer: Self-pay

## 2019-05-11 DIAGNOSIS — G44309 Post-traumatic headache, unspecified, not intractable: Secondary | ICD-10-CM | POA: Diagnosis not present

## 2019-05-11 MED ORDER — MECLIZINE HCL 50 MG PO TABS: 50.0000 mg | ORAL_TABLET | Freq: Three times a day (TID) | ORAL | 0 refills | Status: DC | PRN

## 2019-05-11 MED ORDER — METHOCARBAMOL 500 MG PO TABS: 500.0000 mg | ORAL_TABLET | Freq: Two times a day (BID) | ORAL | 0 refills | Status: DC | PRN

## 2019-05-11 MED ORDER — MELOXICAM 15 MG PO TABS: 15.0000 mg | ORAL_TABLET | Freq: Every day | ORAL | 0 refills | Status: DC

## 2019-05-11 MED ORDER — GABAPENTIN 300 MG PO CAPS: 300.0000 mg | ORAL_CAPSULE | Freq: Three times a day (TID) | ORAL | 3 refills | Status: DC
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
## 2019-05-11 NOTE — Patient Instructions (Addendum)
Joseph Tyler to see you, I hope we can help you feel better soon.   Medications:  Meloxicam: take one daily (15mg ) for anti-inflammatory and pain relief.  Methocarbamol: take up to four daily (500mg  each) for muscle relaxer  Meclizine: take up to three daily (50mg  each) for dizziness. THIS MAY MAKE YOU SLEEPY.  Gabapentin: take one before bed (300mg ) for pain. THIS MAY MAKE YOU SLEEPY.   I have sent referrals to Physical Therapy and for a CT Scan of your head. They will call you soon.  Please let me know if you have any questions.     If you have lab work done today you will be contacted with your lab results within the next 2 weeks.  If you have not heard from [place] then please contact us. The fastest way to get your results is to register for My Chart.   IF you received an x-ray today, you will receive an invoice from [facility]. Please contact [facility] at [phone] with questions or concerns regarding your invoice.   IF you received labwork today, you will receive an invoice from [facility]. Please contact [facility] at [phone] with questions or concerns regarding your invoice.   Our billing staff will not be able to assist you with questions regarding bills from these companies.  You will be contacted with the lab results as soon as they are available. The fastest way to get your results is to activate your My Chart account. Instructions are located on the last page of this paperwork. If you have not heard from [place] regarding the results in 2 weeks, please contact this office.     Concussion, Adult  A concussion is a brain injury from a hard, direct hit (trauma) to the head or body. This direct hit causes the brain to shake quickly back and forth inside the skull. This can damage brain cells and cause chemical changes in the brain. A concussion may also be known as a mild traumatic brain injury (TBI). Concussions are usually not  life-threatening, but the effects of a concussion can be serious. If you have a concussion, you should be very careful to avoid having a second concussion. What are the causes? This condition is caused by:  A direct hit to your head, such as: ? Running into another player during a game. ? Being hit in a fight. ? Hitting your head on a hard surface.  Sudden movement of your body that causes your brain to move back and forth inside the skull, such as in a car crash. What are the signs or symptoms? The signs of a concussion can be hard to notice. Early on, they may be missed by you, family members, and health care providers. You may look fine on the outside but may act or feel differently. Symptoms are usually temporary and most often improve in 7-10 days. Some symptoms appear right away, but other symptoms may not show up for hours or days. If your symptoms last longer than normal, you may have post-concussion syndrome. Every head injury is different. Physical symptoms  Headaches. This can include a feeling of pressure in the head or migraine-like symptoms.  Tiredness (fatigue).  Dizziness.  Problems with coordination or balance.  Vision or hearing problems.  Sensitivity to light or noise.  Nausea or vomiting.  Changes in eating or sleeping patterns.  Numbness or tingling.  Seizure. Mental and emotional symptoms  Memory problems.  Trouble concentrating, organizing, or making decisions.  Slowness in thinking, acting or reacting, speaking, or reading.  Irritability or mood changes.  Anxiety or depression. How is this diagnosed? This condition is diagnosed based on:  Your symptoms.  A description of your injury. You may also have tests, including:  Imaging tests, such as a CT scan or MRI.  Neuropsychological tests. These measure your thinking, understanding, learning, and remembering abilities. How is this treated? Treatment for this condition includes:  Stopping  sports or activity if you are injured. If you hit your head or show signs of concussion: ? Do not return to sports or activities the same day. ? Get checked by a health care provider before you return to your activities.  Physical and mental rest and careful observation, usually at home. Gradually return to your normal activities.  Medicines to help with symptoms such as headaches, nausea, or difficulty sleeping. ? Avoid taking opioid pain medicine while recovering from a concussion.  Avoiding alcohol and drugs. These may slow your recovery and can put you at risk of further injury.  Referral to a concussion clinic or rehabilitation center. Recovery from a concussion can take time. How fast you recover depends on many factors. Return to activities only when:  Your symptoms are completely gone.  Your health care provider says that it is safe. Follow these instructions at home: Activity  Limit activities that require a lot of thought or concentration, such as: ? Doing homework or job-related work. ? Watching TV. ? Working on the computer or phone. ? Playing memory games and puzzles.  Rest. Rest helps your brain heal. Make sure you: ? Get plenty of sleep. Most adults should get 7-9 hours of sleep each night. ? Rest during the day. Take naps or rest breaks when you feel tired.  Avoid physical activity like exercise until your health care provider says it is safe. Stop any activity that worsens symptoms.  Do not do high-risk activities that could cause a second concussion, such as riding a bike or playing sports.  Ask your health care provider when you can return to your normal activities, such as school, work, athletics, and driving. Your ability to react may be slower after a brain injury. Never do these activities if you are dizzy. Your health care provider will likely give you a plan for gradually returning to activities. General instructions   Take over-the-counter and  prescription medicines only as told by your health care provider. Some medicines, such as blood thinners (anticoagulants) and aspirin, may increase the risk for complications, such as bleeding.  Do not drink alcohol until your health care provider says you can.  Watch your symptoms and tell others around you to do the same. Complications sometimes occur after a concussion. Older adults with a brain injury may have a higher risk of serious complications.  Tell your work Freight forwarder, teachers, Government social research officer, school counselor, coach, or Product/process development scientist about your injury, symptoms, and restrictions.  Keep all follow-up visits as told by your health care provider. This is important. How is this prevented? Avoiding another brain injury is very important. In rare cases, another injury can lead to permanent brain damage, brain swelling, or death. The risk of this is greatest during the first 7-10 days after a head injury. Avoid injuries by:  Stopping activities that could lead to a second concussion, such as contact or recreational sports, until your health care provider says it is okay.  Taking these actions once you have returned to sports or activities: ? Avoiding  plays or moves that can cause you to crash into another person. This is how most concussions occur. ? Following the rules and being respectful of other players. Do not engage in violent or illegal plays.  Getting regular exercise that includes strength and balance training.  Wearing a properly fitting helmet during sports, biking, or other activities. Helmets can help protect you from serious skull and brain injuries, but they do not protect you from a concussion. Even when wearing a helmet, you should avoid being hit in the head. Contact a health care provider if:  Your symptoms get worse or they do not improve.  You have new symptoms.  You have another injury. Get help right away if:  You have severe or worsening headaches.  You  have weakness or numbness in any part of your body.  You are confused.  Your coordination gets worse.  You vomit repeatedly.  You are sleepier than normal.  Your speech is slurred.  You cannot recognize people or places.  You have a seizure.  It is difficult to wake you up.  You have unusual behavior changes.  You have changes in your vision.  You lose consciousness. Summary  A concussion is a brain injury that results from a hard, direct hit (trauma) to your head or body.  You may have imaging tests and neuropsychological tests to diagnose a concussion.  Treatment for this condition includes physical and mental rest and careful observation.  Ask your health care provider when you can return to your normal activities, such as school, work, athletics, and driving.  Get help right away if you have a severe headache, weakness on one side of the body, seizures, behavior changes, changes in vision, or if you are confused or sleepier than normal. This information is not intended to replace advice given to you by your health care provider. Make sure you discuss any questions you have with your health care provider. Document Revised: 08/25/2017 Document Reviewed: 08/25/2017 Elsevier Patient Education  2020 [place] [employer] [facility] (pp. [phone]). [place], [place]. Elsevier."> Neurosurgery, 80(1), 6-15. Retrieved on July 09, 2018.[email or web address]"> Primary Care (5th ed., pp. 218-221). [place], [place]: Elsevier."> Brain Injury, 29(6), [phone]. [email or web address]"> Rosen's Emergency Medicine: Concepts and Clinical Practice (9th ed., pp. 301-329). [place], [place]: Elsevier.">  Head Injury, Adult There are many types of head injuries. Head injuries can be as minor as a bump, or they can be a serious medical issue. More severe head injuries include:  A jarring injury to the brain (concussion).  A bruise  (contusion) of the brain. This means there is bleeding in the brain that can cause swelling.  A cracked skull (skull fracture).  Bleeding in the brain that collects, clots, and forms a bump (hematoma). After a head injury, most problems occur within the first 24 hours, but side effects may occur up to 7-10 days after the injury. It is important to watch your condition for any changes. You may need to be observed in the emergency department or urgent care, or you may be admitted to the hospital. What are the causes? There are many possible causes of a head injury. A serious head injury may be caused by a car accident, bicycle or motorcycle accidents, sports injuries, and falls. What are the symptoms? Symptoms of a head injury include a contusion, bump, or bleeding at the site of the injury. Other physical symptoms may include:  Headache.  Nausea or vomiting.  Dizziness.  Feeling tired.  Being  uncomfortable around bright lights or loud noises.  Seizures.  Trouble being awakened.  Fainting. Mental or emotional symptoms may include:  Irritability.  Confusion and memory problems.  Poor attention and concentration.  Changes in eating or sleeping habits.  Anxiety or depression. How is this diagnosed? This condition can usually be diagnosed based on your symptoms, a description of the injury, and a physical exam. You may also have imaging tests done, such as a CT scan or MRI. How is this treated? Treatment for this condition depends on the severity and type of injury you have. The main goal of treatment is to prevent complications and to allow the brain time to heal. Mild head injury If you have a mild head injury, you may be sent home and treatment may include:  Observation. A responsible adult should stay with you for 24 hours after your injury and check on you often.  Physical rest.  Brain rest.  Pain medicines. Severe head injury If you have a severe head injury,  treatment may include:  Close observation. This includes hospitalization with frequent physical exams.  Medicines to relieve pain, prevent seizures, and decrease brain swelling.  Breathing support. This may include using a ventilator.  Treatments to manage the swelling inside the brain.  Brain surgery. This may be needed to: ? Remove a blood clot. ? Stop the bleeding. ? Remove a part of the skull to allow room for the brain to swell. Follow these instructions at home: Activity  Rest and avoid activities that are physically hard or tiring.  Make sure you get enough sleep.  Limit activities that require a lot of thought or attention, such as: ? Watching TV. ? Playing memory games and puzzles. ? Job-related work or homework. ? Working on [employer], and texting.  Avoid activities that could cause another head injury, such as playing sports, until your health care provider approves. Having another head injury, especially before the first one has healed, can be dangerous.  Ask your health care provider when it is safe for you to return to your regular activities, including work or school. Ask your health care provider for a step-by-step plan for gradually returning to activities.  Ask your health care provider when you can drive, ride a bicycle, or use heavy machinery. Your ability to react may be slower after a brain injury. Do not do these activities if you are dizzy. Lifestyle   Do not drink alcohol until your health care provider approves. Do not use drugs. Alcohol and certain drugs may slow your recovery and can put you at risk of further injury.  If it is harder than usual to remember things, write them down.  If you are easily distracted, try to do one thing at a time.  Talk with family members or close friends when making important decisions.  Tell your friends, family, a trusted colleague, and work Freight forwarder about your injury, symptoms, and  restrictions. Have them watch for any new or worsening problems. General instructions  Take over-the-counter and prescription medicines only as told by your health care provider.  Have someone stay with you for 24 hours after your head injury. This person should watch you for any changes in your symptoms and be ready to seek medical help.  Keep all follow-up visits as told by your health care provider. This is important. How is this prevented?  Work on improving your balance and strength to avoid falls.  Wear a seatbelt when you are  in a moving vehicle.  Wear a helmet when riding a bicycle, skiing, or doing any other sport or activity that has a risk of injury.  If you drink alcohol: ? Limit how much you use to:  0-1 drink a day for women.  0-2 drinks a day for men. ? Be aware of how much alcohol is in your drink. In the [place] one drink equals one 12 oz bottle of beer (355 mL), one 5 oz glass of wine (148 mL), or one 1 oz glass of hard liquor (44 mL).  Take safety measures in your home, such as: ? Removing clutter and tripping hazards from floors and stairways. ? Using grab bars in bathrooms and handrails by stairs. ? Placing non-slip mats on floors and in bathtubs. ? Improving lighting in dim areas. Get help right away if:  You have: ? A severe headache that is not helped by medicine. ? Trouble walking or weakness in your arms and legs. ? Clear or bloody fluid coming from your nose or ears. ? Changes in your vision. ? A seizure.  You lose your balance.  You vomit.  Your pupils change size.  Your speech is slurred.  Your dizziness gets worse.  You faint.  You are sleepier than normal and have trouble staying awake.  Your symptoms get worse. These symptoms may represent a serious problem that is an emergency. Do not wait to see if the symptoms will go away. Get medical help right away. Call your local emergency services (911 in the [place] Do not drive yourself to  the hospital. Summary  Head injuries can be minor or they can be a serious medical issue requiring immediate attention.  Treatment for this condition depends on the severity and type of injury you have.  Ask your health care provider when it is safe for you to return to your regular activities, including work or school.  Head injury prevention includes wearing a seat belt in a motor vehicle, using a helmet on a bicycle, limiting alcohol use, and taking safety measures in your home. This information is not intended to replace advice given to you by your health care provider. Make sure you discuss any questions you have with your health care provider. Document Revised: 02/01/2018 Document Reviewed: 01/27/2018 Elsevier Patient Education  [employer].  Post-Concussion Syndrome  A concussion is a brain injury from a direct hit (blow) to your head or body. This blow causes your brain to shake quickly back and forth inside your skull. This can damage brain cells and cause chemical changes in your brain. Concussions are usually not life-threatening but can cause several serious symptoms. Post-concussion syndrome is when symptoms that occur after a concussion last longer than normal. These symptoms can last from weeks to months. What are the causes? The cause of this condition is not known. It can happen whether your head injury was mild or severe. What increases the risk? You are more likely to develop this condition if:  You are male.  You are a child, teen, or young adult.  You had a past head injury.  You have a history of headaches.  You have depression or anxiety. What are the signs or symptoms? Physical symptoms  Headaches.  Tiredness.  Dizziness.  Weakness.  Blurry vision.  Sensitivity to light.  Hearing difficulties. Mental and emotional symptoms  Memory difficulties.  Difficulty with concentration.  Difficulty sleeping or staying asleep.  Feeling  irritable.  Anxiety or depression.  Difficulty learning  new things. How is this diagnosed? This condition may be diagnosed based on:  Your symptoms.  A description of your injury.  Your medical history. Your health care provider may order other tests such as:  Brain function tests (neurological testing).  CT scan. How is this treated? Treatment for this condition may depend on your symptoms. Symptoms usually go away on their own over time. Treatments may include:  Medicines for headaches.  Resting your brain and body for a few days after your injury.  Rehabilitation therapy, such as: ? Physical or occupational therapy. This may include exercises to help with balance and dizziness. ? Mental health counseling. ? Speech therapy. ? Vision therapy. A brain and eye specialist can recommend treatments for vision problems. Follow these instructions at home: Medicines  Take over-the-counter and prescription medicines only as told by your health care provider.  Avoid opioid prescription pain medicines when recovering from a concussion. Activity  Limit your mental activities for the first few days after your injury, such as: ? Homework or job-related work. ? Complex thinking. ? Watching TV, and using a computer or phone. ? Playing memory games and puzzles. ? Gradually return to your normal activity level. If a certain activity brings on your symptoms, stop or slow down until you can do the activity without it triggering your symptoms.  Limit physical activity, such as exercise or sports, for the first few days after a concussion. Gradually return to normal activity as told by your health care provider. ? If a certain activity brings on your symptoms, stop or slow down until you can do the activity without it triggering your symptoms.  Rest. Rest helps your brain heal. Make sure you: ? Get plenty of sleep at night. Most adults should get at least 7-9 hours of sleep each  night. ? Rest during the day. Take naps or rest breaks when you feel tired.  Do not do high-risk activities that could cause a second concussion, such as riding a bike or playing sports. Having another concussion before the first one has healed can be dangerous. General instructions  Do not drink alcohol until your health care provider says you can.  Keep track of the frequency and the severity of your symptoms. Give this information to your health care provider.  Keep all follow-up visits as directed by your health care provider. This is important. Contact a health care provider if:  Your symptoms do not improve.  You have another injury. Get help right away if you:  Have a severe or worsening headache.  Are confused.  Have trouble staying awake.  Pass out.  Vomit.  Have weakness or numbness in any part of your body.  Have a seizure.  Have trouble speaking. Summary  Post-concussion syndrome is when symptoms that occur after a concussion last longer than normal.  Symptoms usually go away on their own over time. Depending on your symptoms, you may need treatment, such as medicines or rehabilitation therapy.  Rest your brain and body for a few days after your injury. Gradually return to activities, as told by your health care provider.  Get plenty of sleep, and avoid alcohol and opioid pain medicines while recovering from a concussion. This information is not intended to replace advice given to you by your health care provider. Make sure you discuss any questions you have with your health care provider. Document Revised: 10/27/2017 Document Reviewed: 02/08/2017 Elsevier Patient Education  2020 [employer].

## 2019-05-12 ENCOUNTER — Encounter: Payer: Self-pay | Admitting: Registered Nurse

## 2019-05-25 ENCOUNTER — Ambulatory Visit
Admission: RE | Admit: 2019-05-25 | Discharge: 2019-05-25 | Disposition: A | Discharge status: Home / Self Care | Payer: Managed Care, Other (non HMO) | Source: Ambulatory Visit | Attending: Registered Nurse | Admitting: Registered Nurse

## 2019-05-25 DIAGNOSIS — G44309 Post-traumatic headache, unspecified, not intractable: Secondary | ICD-10-CM

## 2019-05-28 NOTE — Progress Notes (Signed)
Good morning,  If we could call Mr. Nassif to let him know his head CT is normal, that would be great.  Thank you,  Kathrin Ruddy, NP

## 2019-05-31 ENCOUNTER — Other Ambulatory Visit: Payer: Self-pay

## 2019-05-31 ENCOUNTER — Encounter: Payer: Self-pay | Admitting: Physical Therapy

## 2019-05-31 ENCOUNTER — Ambulatory Visit: Payer: Managed Care, Other (non HMO) | Attending: Registered Nurse | Admitting: Physical Therapy

## 2019-05-31 ENCOUNTER — Other Ambulatory Visit: Payer: Managed Care, Other (non HMO)

## 2019-05-31 DIAGNOSIS — M62838 Other muscle spasm: Secondary | ICD-10-CM | POA: Diagnosis present

## 2019-05-31 DIAGNOSIS — R519 Headache, unspecified: Secondary | ICD-10-CM | POA: Diagnosis not present

## 2019-05-31 DIAGNOSIS — M545 Low back pain, unspecified: Secondary | ICD-10-CM
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
## 2019-06-04 ENCOUNTER — Other Ambulatory Visit: Payer: Self-pay | Admitting: Registered Nurse

## 2019-06-04 DIAGNOSIS — G44309 Post-traumatic headache, unspecified, not intractable: Secondary | ICD-10-CM

## 2019-06-07 ENCOUNTER — Ambulatory Visit: Payer: Managed Care, Other (non HMO) | Admitting: Physical Therapy

## 2019-06-07 ENCOUNTER — Other Ambulatory Visit: Payer: Self-pay

## 2019-06-07 ENCOUNTER — Encounter: Payer: Self-pay | Admitting: Physical Therapy

## 2019-06-07 ENCOUNTER — Ambulatory Visit: Payer: Managed Care, Other (non HMO)

## 2019-06-07 DIAGNOSIS — R519 Headache, unspecified: Secondary | ICD-10-CM

## 2019-06-07 DIAGNOSIS — M545 Low back pain, unspecified: Secondary | ICD-10-CM

## 2019-06-07 DIAGNOSIS — M62838 Other muscle spasm: Secondary | ICD-10-CM

## 2019-06-07 NOTE — Therapy (Signed)
[place], [place], [address] Phone: [phone]   Fax:  [phone]  Physical Therapy Treatment  Patient Details  Name: Joseph Tyler MRN: [id number] Date of Birth: [birth year] Referring Provider (PT): Maximiano Coss NP   Encounter Date: 06/07/2019  PT End of Session - 06/07/19 1845    Visit Number  2    Number of Visits  12    Date for PT Re-Evaluation  07/12/19    Authorization Type  [employer] 20VL    PT Start Time  1713    PT Stop Time  1806   time spent for dry needling and estim not included in direct tx. minutes   PT Time Calculation (min)  53 min    Activity Tolerance  Patient tolerated treatment well    Behavior During Therapy  [facility] for tasks assessed/performed       Past Medical History:  Diagnosis Date  . HTN (hypertension)    on and off since 2002  . Hyperlipidemia   . Kidney stone     History reviewed. No pertinent surgical history.  There were no vitals filed for this visit.  Subjective Assessment - 06/07/19 1803    Subjective  Status similar to initial evaluation with occipital/suboccipital region headache and diffuse back pain symptoms.    Currently in Pain?  Yes    Pain Score  7     Pain Location  Neck    Pain Orientation  Right;Left    Pain Descriptors / Indicators  Sharp    Pain Type  Acute pain    Pain Radiating Towards  into shoulders and occipital region as well as down back into legs    Pain Onset  More than a month ago    Pain Frequency  Constant    Aggravating Factors   looking forward and down    Pain Relieving Factors  medication                        [facility] Adult PT Treatment/Exercise - 06/07/19 0001      Neck Exercises: Supine   Neck Retraction  15 reps      Manual Therapy   Manual Therapy  Joint mobilization;Soft tissue mobilization;Manual Traction    Joint Mobilization  LAD bilat. hips grade I-III oscillations, thoracic PAs grade  I-III    Soft tissue mobilization  thoracolumbar paraspinals    Myofascial Release  suboccipital release    Manual Traction  gentle cervical manual traction      Neck Exercises: Stretches   Upper Trapezius Stretch  Right;Left;2 reps;30 seconds    Other Neck Stretches  supine manual pec minor stretch 2x30 sec       Trigger Point Dry Needling - 06/07/19 0001    Consent Given?  Yes    Education Handout Provided  Yes    Muscles Treated Head and Neck  Upper trapezius;Oblique capitus;Cervical multifidi    Muscles Treated Back/Hip  Erector spinae;Lumbar multifidi;Thoracic multifidi    Dry Needling Comments  needling to bilat. upper trapezius, OCI, thoracic multifidi at T2-3 region, lumbar multifidi at L4-5 region, thoracolumbar longissimus bilat. with 32 gauge 30 mm needles used excepting 32 gauge 50 mm needles used for lumbar multifidi    Electrical Stimulation Performed with Dry Needling  Yes    E-stim with Dry Needling Details  TENS 2 pps x 10 min to upper traps and lumbar multifidi bilat.  PT Education - 06/07/19 1845    Education Details  dry needling    Person(s) Educated  Patient    Methods  Explanation;Handout    Comprehension  Verbalized understanding          PT Long Term Goals - 05/31/19 1756      PT LONG TERM GOAL #1   Title  I with HEP to include walking program    Time  6    Period  Weeks    Status  New    Target Date  07/12/19      PT LONG TERM GOAL #2   Title  improve FOTO =/< 37% limited    Time  6    Period  Weeks    Status  New    Target Date  07/12/19      PT LONG TERM GOAL #3   Title  reduce =/> 75% reduction in headaches    Time  6    Period  Weeks    Status  New    Target Date  07/12/19      PT LONG TERM GOAL #4   Title  report =/> 50% decrease low back to allow him to lift/pull at work as needed    Time  6    Period  Weeks    Status  New    Target Date  07/12/19      PT LONG TERM GOAL #5   Title  improve cervical and lumbar  ROM to WNL with no more than 2/10 pain    Time  6    Period  Weeks    Status  New    Target Date  07/12/19            Plan - 06/07/19 1846    Clinical Impression Statement  Tx. today for extensive manual therapy focus to address muscle tightness/spasm and spinal hypomobility along with cervical region stretches and ROM. Tight/sore right>left paraspinals and stiff in particular in upper thoracic region with PAs. Included trial dry needling to adress myofascial pain and muscle spasm with good tolerance. Given symptom etiology and diffuse pain regions expect progress with therapy will be ongoing/gradual.    Personal Factors and Comorbidities  Behavior Pattern;Comorbidity 1    Comorbidities  HTN    Examination-Activity Limitations  Lift;Other    Examination-Participation Restrictions  Community Activity;Other    Stability/Clinical Decision Making  Stable/Uncomplicated    Clinical Decision Making  Low    Rehab Potential  Fair    PT Frequency  2x / week    PT Duration  6 weeks    PT Treatment/Interventions  Iontophoresis 4mg /ml Dexamethasone;Taping;Patient/family education;Functional mobility training;Moist Heat;Traction;Ultrasound;Therapeutic activities;Therapeutic exercise;Cryotherapy;Electrical Stimulation;Manual techniques;Dry needling;Spinal Manipulations    PT Next Visit Plan  check response dry needling and continue as found beneficial, continue manual and progress cervical/periscapular region and lumbar ROM as tolerated, modalities prn    Consulted and Agree with Plan of Care  Patient       Patient will benefit from skilled therapeutic intervention in order to improve the following deficits and impairments:  Decreased range of motion, Pain, Postural dysfunction, Increased muscle spasms  Visit Diagnosis: Acute nonintractable headache, unspecified headache type  Acute bilateral low back pain without sciatica  Other muscle spasm     Problem List Patient Active Problem List    Diagnosis Date Noted  . Abdominal pain, chronic, right upper quadrant 03/06/2018  . Constipation 03/06/2018  . 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  Fatigue 05/01/2014  . Myalgia 05/01/2014  . 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 Vitamin B12 deficiency 10/29/2013  . Erectile dysfunction 03/29/2012  . Tongue pain 06/08/2011  . ESSENTIAL HYPERTENSION, BENIGN 02/09/2008  . CHEST PAIN 02/09/2008  . ABNORMAL ELECTROCARDIOGRAM 02/09/2008    [name], PT, DPT 06/07/19 6:50 PM  [facility] [address] [place], [place], [address] Phone: [phone]   Fax:  [phone]  Name: Joseph Tyler MRN: [id number] Date of Birth: 12-19-1960

## 2019-06-07 NOTE — Patient Instructions (Signed)

## 2019-06-14 ENCOUNTER — Ambulatory Visit: Payer: Managed Care, Other (non HMO) | Admitting: Physical Therapy

## 2019-06-19 ENCOUNTER — Telehealth: Payer: Self-pay

## 2019-06-19 NOTE — Telephone Encounter (Signed)
Pt called to schedule an appointment with Orland Mustard

## 2019-06-20 ENCOUNTER — Encounter: Payer: Self-pay | Admitting: Physical Therapy

## 2019-06-20 ENCOUNTER — Other Ambulatory Visit: Payer: Self-pay

## 2019-06-20 ENCOUNTER — Encounter: Payer: Self-pay | Admitting: Gastroenterology

## 2019-06-20 ENCOUNTER — Ambulatory Visit: Payer: Managed Care, Other (non HMO) | Attending: Registered Nurse | Admitting: Physical Therapy

## 2019-06-20 DIAGNOSIS — M62838 Other muscle spasm: Secondary | ICD-10-CM | POA: Diagnosis present

## 2019-06-20 DIAGNOSIS — R519 Headache, unspecified: Secondary | ICD-10-CM | POA: Diagnosis present

## 2019-06-20 DIAGNOSIS — M545 Low back pain, unspecified: Secondary | ICD-10-CM

## 2019-06-20 NOTE — Therapy (Signed)
[place], [place], [address] Phone: [phone]   Fax:  [phone]  Physical Therapy Treatment  Patient Details  Name: Joseph Tyler MRN: [id number] Date of Birth: 1960/01/26 Referring Provider (PT): Maximiano Coss NP   Encounter Date: 06/20/2019  PT End of Session - 06/20/19 0849    Visit Number  3    Number of Visits  12    Date for PT Re-Evaluation  07/12/19    Authorization Type  [employer] 20VL    PT Start Time  0807    PT Stop Time  0856    PT Time Calculation (min)  49 min    Activity Tolerance  Patient tolerated treatment well    Behavior During Therapy  [facility] for tasks assessed/performed       Past Medical History:  Diagnosis Date  . HTN (hypertension)    on and off since 2002  . Hyperlipidemia   . Kidney stone     History reviewed. No pertinent surgical history.  There were no vitals filed for this visit.  Subjective Assessment - 06/20/19 0808    Subjective  Not much change after last visit about 2 weeks ago. Pain 7/10 including "all over" but most notable "between shoulder blades" in upper thoracic region. Pt. defers dry needling today.    Patient Stated Goals  feeling better and go back to work - works for [employer] tx, driving and lifting (50#) pulling with out pain and like he was before.    Currently in Pain?  Yes    Pain Score  7     Pain Location  Back    Pain Orientation  Upper    Pain Descriptors / Indicators  --   "sensation"   Pain Type  Acute pain    Pain Onset  More than a month ago    Pain Frequency  Constant    Aggravating Factors   no specific aggs    Pain Relieving Factors  Mild ease with muscle relaxers                        [facility] Adult PT Treatment/Exercise - 06/20/19 0001      Neck Exercises: Theraband   Shoulder Extension  15 reps;Green    Rows  15 reps;Green    Horizontal ABduction  15 reps;Red    Horizontal ABduction  Limitations  supine      Neck Exercises: Supine   Neck Retraction Limitations  supine assisted retraction with upper cervical flexion extension nod x 15 reps      Moist Heat Therapy   Number Minutes Moist Heat  10 Minutes    Moist Heat Location  --   thoracic region in prone     Manual Therapy   Joint Mobilization  thoracic PAs grade I-III    Soft tissue mobilization  STM: trigger point ischemic compression/STM focus to rhomboids bilat.    Myofascial Release  suboccipital release    Manual Traction  gentle cervical manual traction      Neck Exercises: Stretches   Upper Trapezius Stretch  Right;Left;2 reps;30 seconds    Other Neck Stretches  supine manual pec minor stretch 2x30 sec             PT Education - 06/20/19 0849    Education Details  POC, trigger points    Person(s) Educated  Patient    Methods  Explanation    Comprehension  Verbalized understanding          PT Long Term Goals - 05/31/19 1756      PT LONG TERM GOAL #1   Title  I with HEP to include walking program    Time  6    Period  Weeks    Status  New    Target Date  07/12/19      PT LONG TERM GOAL #2   Title  improve FOTO =/< 37% limited    Time  6    Period  Weeks    Status  New    Target Date  07/12/19      PT LONG TERM GOAL #3   Title  reduce =/> 75% reduction in headaches    Time  6    Period  Weeks    Status  New    Target Date  07/12/19      PT LONG TERM GOAL #4   Title  report =/> 50% decrease low back to allow him to lift/pull at work as needed    Time  6    Period  Weeks    Status  New    Target Date  07/12/19      PT LONG TERM GOAL #5   Title  improve cervical and lumbar ROM to WNL with no more than 2/10 pain    Time  6    Period  Weeks    Status  New    Target Date  07/12/19            Plan - 06/20/19 0850    Clinical Impression Statement  Limited change in status to date but given mechanism of injury, high pain level and diffuse symptoms expect progress  with therapy will take more time. Focus thoracic and cervical regions today with exercises and manual tx. Multiple triggerpoints noted in bilat. rhomboids so focused in this with STM and will await further tx. response.    Personal Factors and Comorbidities  Behavior Pattern;Comorbidity 1    Comorbidities  HTN    Examination-Activity Limitations  Lift;Other    Examination-Participation Restrictions  Community Activity;Other    Stability/Clinical Decision Making  Stable/Uncomplicated    Clinical Decision Making  Low    Rehab Potential  Fair    PT Frequency  2x / week    PT Duration  6 weeks    PT Treatment/Interventions  Iontophoresis 4mg /ml Dexamethasone;Taping;Patient/family education;Functional mobility training;Moist Heat;Traction;Ultrasound;Therapeutic activities;Therapeutic exercise;Cryotherapy;Electrical Stimulation;Manual techniques;Dry needling;Spinal Manipulations    PT Next Visit Plan  continue manual and progress cervical/periscapular region and lumbar ROM as tolerated, further dry needling and modalities prn    Consulted and Agree with Plan of Care  Patient       Patient will benefit from skilled therapeutic intervention in order to improve the following deficits and impairments:  Decreased range of motion, Pain, Postural dysfunction, Increased muscle spasms  Visit Diagnosis: Acute nonintractable headache, unspecified headache type  Acute bilateral low back pain without sciatica  Other muscle spasm     Problem List Patient Active Problem List   Diagnosis Date Noted  . Abdominal pain, chronic, right upper quadrant 03/06/2018  . Constipation 03/06/2018  . 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 Fatigue 05/01/2014  . Myalgia 05/01/2014  . 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 Vitamin B12 deficiency 10/29/2013  . Erectile dysfunction 03/29/2012  . Tongue pain 06/08/2011  . ESSENTIAL HYPERTENSION, BENIGN 02/09/2008  . CHEST PAIN 02/09/2008  . ABNORMAL ELECTROCARDIOGRAM  02/09/2008  Beaulah Tyler, PT, DPT 06/20/19 8:52 AM  [facility] [address] [place], [place], [address] Phone: [phone]   Fax:  [phone]  Name: Joseph Tyler MRN: [id number] Date of Birth: 23-Jun-1960

## 2019-06-21 ENCOUNTER — Other Ambulatory Visit: Payer: Self-pay

## 2019-06-21 ENCOUNTER — Encounter: Payer: Self-pay | Admitting: Physical Therapy

## 2019-06-21 ENCOUNTER — Ambulatory Visit: Payer: Managed Care, Other (non HMO) | Admitting: Physical Therapy

## 2019-06-21 DIAGNOSIS — R519 Headache, unspecified: Secondary | ICD-10-CM | POA: Diagnosis not present

## 2019-06-21 DIAGNOSIS — M545 Low back pain, unspecified: Secondary | ICD-10-CM

## 2019-06-21 DIAGNOSIS — M62838 Other muscle spasm: Secondary | ICD-10-CM

## 2019-06-21 NOTE — Therapy (Signed)
[place], [place], [address] Phone: [phone]   Fax:  [phone]  Physical Therapy Treatment  Patient Details  Name: Joseph Tyler MRN: [id number] Date of Birth: Dec 08, 1960 Referring Provider (PT): Maximiano Coss NP   Encounter Date: 06/21/2019  PT End of Session - 06/21/19 1443    Visit Number  4    Number of Visits  12    Date for PT Re-Evaluation  07/12/19    Authorization Type  [employer] 20VL    PT Start Time  1443   in late   PT Stop Time  1535    PT Time Calculation (min)  52 min    Activity Tolerance  Patient tolerated treatment well    Behavior During Therapy  [facility] for tasks assessed/performed       Past Medical History:  Diagnosis Date  . HTN (hypertension)    on and off since 2002  . Hyperlipidemia   . Kidney stone     History reviewed. No pertinent surgical history.  There were no vitals filed for this visit.  Subjective Assessment - 06/21/19 1443    Subjective  Pt reports he is still having his HA.    Patient Stated Goals  feeling better and go back to work - works for [employer] tx, driving and lifting (50#) pulling with out pain and like he was before.    Currently in Pain?  Yes    Pain Score  7     Pain Location  Back    Pain Orientation  Upper    Pain Descriptors / Indicators  Aching    Pain Type  Acute pain    Pain Onset  More than a month ago                        [facility] Adult PT Treatment/Exercise - 06/21/19 0001      Neck Exercises: Seated   W Back  10 reps    Other Seated Exercise  pulleys for stretching through shoulders and side with half bolster behind back      Neck Exercises: Supine   Cervical Isometrics  Extension;10 reps;3 secs      Moist Heat Therapy   Number Minutes Moist Heat  12 Minutes    Moist Heat Location  Cervical   and thoracic in prone     Manual Therapy   Manual Therapy  Joint mobilization;Soft tissue  mobilization;Myofascial release;Passive ROM    Joint Mobilization  thoracic and cervical CPA and bilat UPA mobs, hypomobile T2-T8 with slight rotation at T3/4,  grade IIs and IVs, increased mobility after    Soft tissue mobilization  STM to bilat upper traps, levators and pecs in supine, TPR and STM to bilat SCM, Rt much tighter than Lt    Myofascial Release  occipital base release with STM to periicerivcal muscles after    Passive ROM  cervical into rotation and side bend for stretches and with spinal over pressure.                   PT Long Term Goals - 05/31/19 1756      PT LONG TERM GOAL #1   Title  I with HEP to include walking program    Time  6    Period  Weeks    Status  New    Target Date  07/12/19      PT LONG TERM  GOAL #2   Title  improve FOTO =/< 37% limited    Time  6    Period  Weeks    Status  New    Target Date  07/12/19      PT LONG TERM GOAL #3   Title  reduce =/> 75% reduction in headaches    Time  6    Period  Weeks    Status  New    Target Date  07/12/19      PT LONG TERM GOAL #4   Title  report =/> 50% decrease low back to allow him to lift/pull at work as needed    Time  6    Period  Weeks    Status  New    Target Date  07/12/19      PT LONG TERM GOAL #5   Title  improve cervical and lumbar ROM to WNL with no more than 2/10 pain    Time  6    Period  Weeks    Status  New    Target Date  07/12/19            Plan - 06/21/19 1528    Clinical Impression Statement  Pt reported good relief with manaul work to [employer] and with spinal mobilization.  He did have some improved movement after mobs as well as increased tissue pliability after manual work.  He reports overall he has not felt much improvement.  Would benefit from some more manual work and spinal mobs then progress into spinal stabilization exercise of cervical and thoracic region.  He did not tolerate self thoracic mobilization over bolster well d/t reports of Lt shoulder  pain    Rehab Potential  Fair    PT Frequency  2x / week    PT Duration  6 weeks    PT Treatment/Interventions  Iontophoresis 4mg /ml Dexamethasone;Taping;Patient/family education;Functional mobility training;Moist Heat;Traction;Ultrasound;Therapeutic activities;Therapeutic exercise;Cryotherapy;Electrical Stimulation;Manual techniques;Dry needling;Spinal Manipulations    PT Next Visit Plan  cervical and thoracic mobilization    Consulted and Agree with Plan of Care  Patient       Patient will benefit from skilled therapeutic intervention in order to improve the following deficits and impairments:  Decreased range of motion, Pain, Postural dysfunction, Increased muscle spasms  Visit Diagnosis: Acute nonintractable headache, unspecified headache type  Acute bilateral low back pain without sciatica  Other muscle spasm     Problem List Patient Active Problem List   Diagnosis Date Noted  . Abdominal pain, chronic, right upper quadrant 03/06/2018  . Constipation 03/06/2018  . 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 Fatigue 05/01/2014  . Myalgia 05/01/2014  . 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 Vitamin B12 deficiency 10/29/2013  . Erectile dysfunction 03/29/2012  . Tongue pain 06/08/2011  . ESSENTIAL HYPERTENSION, BENIGN 02/09/2008  . CHEST PAIN 02/09/2008  . ABNORMAL ELECTROCARDIOGRAM 02/09/2008    Jeral Pinch PT  06/21/2019, 3:32 PM  [facility] [address] [place], [place], [address] Phone: [phone]   Fax:  [phone]  Name: Andreus Meighen MRN: [id number] Date of Birth: 1960/06/25

## 2019-06-25 ENCOUNTER — Encounter: Payer: Self-pay | Admitting: Registered Nurse

## 2019-06-25 ENCOUNTER — Other Ambulatory Visit: Payer: Self-pay

## 2019-06-25 ENCOUNTER — Ambulatory Visit: Payer: Managed Care, Other (non HMO) | Admitting: Registered Nurse

## 2019-06-25 ENCOUNTER — Ambulatory Visit (INDEPENDENT_AMBULATORY_CARE_PROVIDER_SITE_OTHER): Payer: Managed Care, Other (non HMO) | Admitting: Registered Nurse

## 2019-06-25 VITALS — BP 118/78 | HR 69 | Temp 97.9°F | Ht 67.0 in | Wt 135.0 lb

## 2019-06-25 DIAGNOSIS — R413 Other amnesia: Secondary | ICD-10-CM

## 2019-06-25 DIAGNOSIS — M25519 Pain in unspecified shoulder: Secondary | ICD-10-CM

## 2019-06-25 DIAGNOSIS — M542 Cervicalgia: Secondary | ICD-10-CM | POA: Diagnosis not present

## 2019-06-25 DIAGNOSIS — D573 Sickle-cell trait: Secondary | ICD-10-CM

## 2019-06-25 DIAGNOSIS — R29818 Other symptoms and signs involving the nervous system: Secondary | ICD-10-CM

## 2019-06-25 DIAGNOSIS — R339 Retention of urine, unspecified: Secondary | ICD-10-CM

## 2019-06-25 LAB — POCT URINALYSIS DIP (MANUAL ENTRY)
Bilirubin, UA: NEGATIVE
Glucose, UA: NEGATIVE mg/dL
Ketones, POC UA: NEGATIVE mg/dL
Leukocytes, UA: NEGATIVE
Nitrite, UA: NEGATIVE
Protein Ur, POC: NEGATIVE mg/dL
Spec Grav, UA: 1.02 (ref 1.010–1.025)
Urobilinogen, UA: 0.2 E.U./dL
pH, UA: 8 (ref 5.0–8.0)

## 2019-06-25 NOTE — Patient Instructions (Signed)
° ° ° °  If you have lab work done today you will be contacted with your lab results within the next 2 weeks.  If you have not heard from us then please contact us. The fastest way to get your results is to register for My Chart. ° ° °IF you received an x-ray today, you will receive an invoice from [facility]. Please contact [facility] at [phone] with questions or concerns regarding your invoice.  ° °IF you received labwork today, you will receive an invoice from [facility]. Please contact [facility] at [phone] with questions or concerns regarding your invoice.  ° °Our billing staff will not be able to assist you with questions regarding bills from these companies. ° °You will be contacted with the lab results as soon as they are available. The fastest way to get your results is to activate your My Chart account. Instructions are located on the last page of this paperwork. If you have not heard from us regarding the results in 2 weeks, please contact this office. °  ° ° ° °
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
## 2019-06-26 LAB — VITAMIN B12: Vitamin B-12: 263 pg/mL (ref 232–1245)

## 2019-06-26 LAB — VITAMIN D 25 HYDROXY (VIT D DEFICIENCY, FRACTURES): Vit D, 25-Hydroxy: 48.3 ng/mL (ref 30.0–100.0)

## 2019-06-26 NOTE — Progress Notes (Signed)
If we could call Mr. Fiorello and let him know his Vitamin D and b12 are normal, that would be great. I want to pursue the MRIs that we had discussed as well as the referral to neurology.  Thank you,  Kathrin Ruddy, NP

## 2019-06-27 ENCOUNTER — Ambulatory Visit: Payer: Managed Care, Other (non HMO) | Admitting: Physical Therapy

## 2019-06-27 ENCOUNTER — Encounter: Payer: Self-pay | Admitting: Radiology

## 2019-06-27 ENCOUNTER — Other Ambulatory Visit: Payer: Self-pay

## 2019-06-27 ENCOUNTER — Telehealth: Payer: Self-pay | Admitting: Registered Nurse

## 2019-06-27 ENCOUNTER — Encounter: Payer: Self-pay | Admitting: Physical Therapy

## 2019-06-27 DIAGNOSIS — M62838 Other muscle spasm: Secondary | ICD-10-CM

## 2019-06-27 DIAGNOSIS — M545 Low back pain, unspecified: Secondary | ICD-10-CM

## 2019-06-27 DIAGNOSIS — R519 Headache, unspecified: Secondary | ICD-10-CM

## 2019-06-27 NOTE — Telephone Encounter (Signed)
Pt informed he is already scheduled with Neuro but not the MRI he was agreeable to plan

## 2019-06-27 NOTE — Telephone Encounter (Signed)
Pt called for his Lab Results

## 2019-06-27 NOTE — Therapy (Signed)
[place], [place], [address] Phone: [phone]   Fax:  [phone]  Physical Therapy Treatment  Patient Details  Name: Joseph Tyler MRN: [id number] Date of Birth: 1960/01/23 Referring Provider (PT): Maximiano Coss NP   Encounter Date: 06/27/2019  PT End of Session - 06/27/19 1533    Visit Number  5    Number of Visits  12    Date for PT Re-Evaluation  07/12/19    Authorization Type  [employer] 20VL    PT Start Time  1418    PT Stop Time  1512    PT Time Calculation (min)  54 min    Activity Tolerance  Patient tolerated treatment well    Behavior During Therapy  [facility] for tasks assessed/performed       Past Medical History:  Diagnosis Date  . HTN (hypertension)    on and off since 2002  . Hyperlipidemia   . Kidney stone     History reviewed. No pertinent surgical history.  There were no vitals filed for this visit.  Subjective Assessment - 06/27/19 1521    Subjective  Pt. had follow up with NP and has had MRIs ordered due to report of some difficulty emptying his bladder to rule out cauda equina. He does have some previous history of some urinary issues but had reported current symptoms feel different. MRI not completed as of time of today's tx.-checked with Maximiano Coss, NP and OK for now to proceed with PT while awaiting testing. Primary pain today bilateral upper trapezius and suboccipital region which pt. rates 7/10.                        [facility] Adult PT Treatment/Exercise - 06/27/19 0001      Moist Heat Therapy   Number Minutes Moist Heat  12 Minutes    Moist Heat Location  Cervical      Manual Therapy   Joint Mobilization  Thoracic PAs grade I-III, cervical lateral glides/downslides grade I-III    Soft tissue mobilization  STM/trigger point release bilateral upper trapezius and levator region, STM in prone right upper thoracic region    Myofascial Release   suboccipital release    Manual Traction  cervical      Neck Exercises: Stretches   Upper Trapezius Stretch  Right;Left;2 reps;30 seconds    Levator Stretch  Right;Left;2 reps;30 seconds    Chest Stretch  3 reps;20 seconds   supine pec minor stretch   Other Neck Stretches  seated thoracic extension ROM x 15 reps             PT Education - 06/27/19 1527    Education Details  trigger point/try using cane/"Theracane" to assist self trigger point release, POC-contact imaging practice if not getting call to schedule MRI in the next 1-2 days    Person(s) Educated  Patient    Methods  Explanation    Comprehension  Verbalized understanding          PT Long Term Goals - 05/31/19 1756      PT LONG TERM GOAL #1   Title  I with HEP to include walking program    Time  6    Period  Weeks    Status  New    Target Date  07/12/19      PT LONG TERM GOAL #2   Title  improve FOTO =/< 37% limited    Time  6    Period  Weeks    Status  New    Target Date  07/12/19      PT LONG TERM GOAL #3   Title  reduce =/> 75% reduction in headaches    Time  6    Period  Weeks    Status  New    Target Date  07/12/19      PT LONG TERM GOAL #4   Title  report =/> 50% decrease low back to allow him to lift/pull at work as needed    Time  6    Period  Weeks    Status  New    Target Date  07/12/19      PT LONG TERM GOAL #5   Title  improve cervical and lumbar ROM to WNL with no more than 2/10 pain    Time  6    Period  Weeks    Status  New    Target Date  07/12/19            Plan - 06/27/19 1533    Clinical Impression Statement  Fair progess given continued high pain level and now pending MRIs to rule out cauda equina given reported bladder issues. Focused with manual therapy and exercises for regions of pain as noted in subjective. Multiple trigger points noted in upper trapezius and levator region so suspect predominate symptoms are myofascial. Plan continue PT pending MRI results.     Personal Factors and Comorbidities  Behavior Pattern;Comorbidity 1    Comorbidities  HTN    Examination-Activity Limitations  Lift;Other    Examination-Participation Restrictions  Community Activity;Other    Stability/Clinical Decision Making  Stable/Uncomplicated    Clinical Decision Making  Low    Rehab Potential  Fair    PT Frequency  2x / week    PT Duration  6 weeks    PT Treatment/Interventions  Iontophoresis 4mg /ml Dexamethasone;Taping;Patient/family education;Functional mobility training;Moist Heat;Traction;Ultrasound;Therapeutic activities;Therapeutic exercise;Cryotherapy;Electrical Stimulation;Manual techniques;Dry needling;Spinal Manipulations    PT Next Visit Plan  continue POC pending MRI, cervical and thoracic mobilization, postural stretches and training    Consulted and Agree with Plan of Care  Patient       Patient will benefit from skilled therapeutic intervention in order to improve the following deficits and impairments:  Decreased range of motion, Pain, Postural dysfunction, Increased muscle spasms  Visit Diagnosis: Acute nonintractable headache, unspecified headache type  Acute bilateral low back pain without sciatica  Other muscle spasm     Problem List Patient Active Problem List   Diagnosis Date Noted  . Abdominal pain, chronic, right upper quadrant 03/06/2018  . Constipation 03/06/2018  . 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 Fatigue 05/01/2014  . Myalgia 05/01/2014  . 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 Vitamin B12 deficiency 10/29/2013  . Erectile dysfunction 03/29/2012  . Tongue pain 06/08/2011  . ESSENTIAL HYPERTENSION, BENIGN 02/09/2008  . CHEST PAIN 02/09/2008  . ABNORMAL ELECTROCARDIOGRAM 02/09/2008    Joseph Tyler, PT, DPT 06/27/19 3:38 PM  [facility] [address] [place], [place], [address] Phone: [phone]   Fax:  [phone]  Name: Joseph Tyler MRN:  [id number] Date of Birth: Feb 01, 1960

## 2019-06-28 ENCOUNTER — Ambulatory Visit: Payer: Managed Care, Other (non HMO) | Admitting: Physical Therapy

## 2019-06-28 ENCOUNTER — Encounter: Payer: Self-pay | Admitting: Physical Therapy

## 2019-06-28 DIAGNOSIS — M545 Low back pain, unspecified: Secondary | ICD-10-CM

## 2019-06-28 DIAGNOSIS — R519 Headache, unspecified: Secondary | ICD-10-CM

## 2019-06-28 DIAGNOSIS — M62838 Other muscle spasm: Secondary | ICD-10-CM

## 2019-06-28 NOTE — Therapy (Signed)
[address] [place], [place], [address] Phone: [phone]   Fax:  [phone]  Physical Therapy Treatment  Patient Details  Name: Joseph Tyler MRN: [id number] Date of Birth: September 14, 1960 Referring Provider (PT): Maximiano Coss NP   Encounter Date: 06/28/2019   PT End of Session - 06/28/19 1630    Visit Number 6    Number of Visits 12    Date for PT Re-Evaluation 07/12/19    Authorization Type [employer] [id number]    PT Start Time 1424   arrived a few minutes late   PT Stop Time 1500    PT Time Calculation (min) 36 min    Activity Tolerance Patient tolerated treatment well    Behavior During Therapy [facility] for tasks assessed/performed           Past Medical History:  Diagnosis Date  . HTN (hypertension)    on and off since 2002  . Hyperlipidemia   . Kidney stone     History reviewed. No pertinent surgical history.  There were no vitals filed for this visit.   Subjective Assessment - 06/28/19 1549    Subjective No significant improvement in pain this far with continued pain in bilateral upper trapezius region, suboccipital region and upper back. Still pending scheduling of MRIs.    Patient Stated Goals feeling better and go back to work - works for [employer] tx, driving and lifting (50#) pulling with out pain and like he was before.    Currently in Pain? Yes    Pain Score 7     Pain Location Back    Pain Orientation Upper    Pain Descriptors / Indicators Aching    Pain Type Acute pain    Pain Radiating Towards shoulders/upper trapezius bilat., occipital region and pain caudally down back    Pain Onset More than a month ago    Pain Frequency Constant    Aggravating Factors  no specific aggs    Pain Relieving Factors mild ease with muscle relaxers              [facility] PT Assessment - 06/28/19 0001      Observation/Other Assessments   Focus on Therapeutic Outcomes (FOTO)  44% limitated       AROM   Cervical - Right Rotation 55    Cervical - Left Rotation 60                         [facility] Adult PT Treatment/Exercise - 06/28/19 0001      Neck Exercises: Supine   Neck Retraction 15 reps      Moist Heat Therapy   Number Minutes Moist Heat --   pt. deferred heat-reports did not help last time     Manual Therapy   Soft tissue mobilization seated STM bilateral upper trapezius and levator region, lower cervical/upper thoracic paraspinals    Myofascial Release suboccipital release    Manual Traction cervical      Neck Exercises: Stretches   Upper Trapezius Stretch Right;Left;2 reps;30 seconds    Levator Stretch Right;Left;2 reps;30 seconds                  PT Education - 06/28/19 1629    Education Details POC    Person(s) Educated Patient    Methods Explanation    Comprehension Verbalized understanding               PT Long  Term Goals - 05/31/19 1756      PT LONG TERM GOAL #1   Title I with HEP to include walking program    Time 6    Period Weeks    Status New    Target Date 07/12/19      PT LONG TERM GOAL #2   Title improve FOTO =/< 37% limited    Time 6    Period Weeks    Status New    Target Date 07/12/19      PT LONG TERM GOAL #3   Title reduce =/> 75% reduction in headaches    Time 6    Period Weeks    Status New    Target Date 07/12/19      PT LONG TERM GOAL #4   Title report =/> 50% decrease low back to allow him to lift/pull at work as needed    Time 6    Period Weeks    Status New    Target Date 07/12/19      PT LONG TERM GOAL #5   Title improve cervical and lumbar ROM to WNL with no more than 2/10 pain    Time 6    Period Weeks    Status New    Target Date 07/12/19                 Plan - 06/28/19 1630    Clinical Impression Statement Limited progress thus far with therapy to decrease pain level but pt. has made gains in functional status as evidenced by 14% improvement in FOTO score from baseline  and also showing some improvement today with cervical ROM from baseline status. He continues with diffuse muscle spasm and tightness in cervical and thoracic regions-tx. focus manual therapy and stretches/ROM to address. Plan continue PT pending MRI results and status for potential further improvement.    Personal Factors and Comorbidities Behavior Pattern;Comorbidity 1    Comorbidities HTN    Examination-Activity Limitations Lift;Other    Examination-Participation Restrictions Community Activity;Other    Stability/Clinical Decision Making Stable/Uncomplicated    Clinical Decision Making Low    Rehab Potential Fair    PT Frequency 2x / week    PT Duration 6 weeks    PT Treatment/Interventions Iontophoresis 4mg /ml Dexamethasone;Taping;Patient/family education;Functional mobility training;Moist Heat;Traction;Ultrasound;Therapeutic activities;Therapeutic exercise;Cryotherapy;Electrical Stimulation;Manual techniques;Dry needling;Spinal Manipulations    PT Next Visit Plan continue POC pending MRI, cervical and thoracic mobilization, postural stretches and training    Consulted and Agree with Plan of Care Patient           Patient will benefit from skilled therapeutic intervention in order to improve the following deficits and impairments:  Decreased range of motion, Pain, Postural dysfunction, Increased muscle spasms  Visit Diagnosis: Acute nonintractable headache, unspecified headache type  Acute bilateral low back pain without sciatica  Other muscle spasm     Problem List Patient Active Problem List   Diagnosis Date Noted  . Abdominal pain, chronic, right upper quadrant 03/06/2018  . Constipation 03/06/2018  . 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 Fatigue 05/01/2014  . Myalgia 05/01/2014  . 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 Vitamin B12 deficiency 10/29/2013  . Erectile dysfunction 03/29/2012  . Tongue pain 06/08/2011  . ESSENTIAL HYPERTENSION, BENIGN 02/09/2008  . CHEST PAIN  02/09/2008  . ABNORMAL ELECTROCARDIOGRAM 02/09/2008    Beaulah Tyler, PT, DPT 06/28/19 4:34 PM  [facility] [address] [place], [place], [address] Phone: [phone]   Fax:  [phone]  Name: Joseph Tyler MRN: [id number] Date of Birth: 03-Jun-1960

## 2019-07-05 ENCOUNTER — Ambulatory Visit: Payer: Managed Care, Other (non HMO) | Admitting: Physical Therapy
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
## 2019-07-13 NOTE — Telephone Encounter (Signed)
Spoke with patient to inform that the MRI for brain , cervical ,and lumbar spines were denied by the insurance per Kandis Cocking., and per insurance x ray is needed + 6 weeks of trial treatment w/ additional re-eval afterwards. Patient said he already had his head CT scan and would like your advice on the neck and back MRI ordered.

## 2019-07-13 NOTE — Telephone Encounter (Signed)
I, look pt have a app @ [facility] on 08/23/19 I, called pt no answer can,t not LVM

## 2019-07-18 ENCOUNTER — Ambulatory Visit: Payer: Managed Care, Other (non HMO) | Admitting: Physical Therapy

## 2019-07-27 ENCOUNTER — Other Ambulatory Visit: Payer: Self-pay | Admitting: Registered Nurse

## 2019-07-27 DIAGNOSIS — I10 Essential (primary) hypertension: Secondary | ICD-10-CM

## 2019-08-01 ENCOUNTER — Ambulatory Visit: Payer: Managed Care, Other (non HMO) | Attending: Registered Nurse | Admitting: Physical Therapy

## 2019-08-01 ENCOUNTER — Other Ambulatory Visit: Payer: Self-pay

## 2019-08-01 ENCOUNTER — Encounter: Payer: Self-pay | Admitting: Physical Therapy

## 2019-08-01 DIAGNOSIS — R519 Headache, unspecified: Secondary | ICD-10-CM | POA: Diagnosis present

## 2019-08-01 DIAGNOSIS — M62838 Other muscle spasm: Secondary | ICD-10-CM | POA: Insufficient documentation

## 2019-08-01 DIAGNOSIS — M545 Low back pain, unspecified: Secondary | ICD-10-CM
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
## 2019-08-22 ENCOUNTER — Ambulatory Visit: Payer: Managed Care, Other (non HMO)

## 2019-08-23 ENCOUNTER — Ambulatory Visit: Payer: Managed Care, Other (non HMO) | Admitting: Neurology

## 2019-08-23 ENCOUNTER — Encounter: Payer: Self-pay | Admitting: Neurology

## 2019-08-23 VITALS — BP 114/62 | HR 68 | Ht 67.0 in | Wt 146.0 lb

## 2019-08-23 DIAGNOSIS — R202 Paresthesia of skin: Secondary | ICD-10-CM | POA: Insufficient documentation
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
## 2019-08-24 ENCOUNTER — Telehealth: Payer: Self-pay | Admitting: Neurology

## 2019-08-24 NOTE — Telephone Encounter (Signed)
[employer] order sent to GI. They will obtain the auth and reach out to the patient to schedule.  

## 2019-08-29 ENCOUNTER — Ambulatory Visit: Payer: Managed Care, Other (non HMO) | Admitting: Physical Therapy

## 2019-09-10 ENCOUNTER — Ambulatory Visit: Payer: Managed Care, Other (non HMO) | Attending: Registered Nurse | Admitting: Physical Therapy

## 2019-09-10 ENCOUNTER — Telehealth: Payer: Self-pay | Admitting: Physical Therapy

## 2019-09-10 NOTE — Telephone Encounter (Signed)
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 Discussed status given time since last therapy visit/last visits cancelled-he is scheduled with neurologist for further testing in early October and wishes to hold off on further PT until after this. Discussed would hold further therapy for now and await status from MD visit/new referral if recommended by MD.
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
## 2019-10-22 ENCOUNTER — Other Ambulatory Visit: Payer: Self-pay

## 2019-10-22 ENCOUNTER — Ambulatory Visit (INDEPENDENT_AMBULATORY_CARE_PROVIDER_SITE_OTHER): Payer: Managed Care, Other (non HMO) | Admitting: Neurology

## 2019-10-22 DIAGNOSIS — M542 Cervicalgia: Secondary | ICD-10-CM | POA: Diagnosis not present

## 2019-10-22 DIAGNOSIS — Z0289 Encounter for other administrative examinations: Secondary | ICD-10-CM

## 2019-10-22 DIAGNOSIS — R202 Paresthesia of skin: Secondary | ICD-10-CM

## 2019-10-22 MED ORDER — MELOXICAM 7.5 MG PO TABS: 7.5000 mg | ORAL_TABLET | Freq: Every day | ORAL | 11 refills | Status: DC

## 2019-10-22 MED ORDER — DULOXETINE HCL 60 MG PO CPEP: 60.0000 mg | ORAL_CAPSULE | Freq: Every day | ORAL | 12 refills | Status: DC

## 2019-10-24 NOTE — Procedures (Signed)
Full Name: Akhil Piscopo Gender: Male MRN #: [id number] Date of Birth: 03/07/1960    Visit Date: 10/22/2019 08:55 Age: 59 Years Examining Physician: Marcial Pacas, MD  Referring Physician: Marcial Pacas, MD Height: 5 feet 7 inch History: 59 year old male presented with intermittent bilateral hands paresthesia.  Summary of the test: Nerve conduction study: Right sural, superficial peroneal, median, ulnar sensory responses were normal. Right tibial, peroneal to EDB, median, ulnar motor responses were normal.  Electromyography: Selected needle examinations of right lower and upper extremity muscles were normal.    Conclusion: This is a normal study.  There is no electrodiagnostic evidence of peripheral neuropathy, or intrinsic muscle disease.    ------------------------------- Marcial Pacas M.D. PhD  [facility] [address] [place], [place] [address] Tel: [phone] Fax: [phone]  Verbal informed consent was obtained from the patient, patient was informed of potential risk of procedure, including bruising, bleeding, hematoma formation, infection, muscle weakness, muscle pain, numbness, among others.        [place]    Nerve / Sites Muscle Latency Ref. Amplitude Ref. Rel Amp Segments Distance Velocity Ref. Area    ms ms mV mV %  cm m/s m/s mVms  R Median - APB     Wrist APB 3.7 ?4.4 4.4 ?4.0 100 Wrist - APB 7   18.4     Upper arm APB 8.1  4.0  92.2 Upper arm - Wrist 24 54 ?49 18.7  R Ulnar - ADM     Wrist ADM 2.7 ?3.3 11.1 ?6.0 100 Wrist - ADM 7   33.0     B.Elbow ADM 6.4  8.9  80.4 B.Elbow - Wrist 21 58 ?49 28.8     A.Elbow ADM 8.2  8.9  100 A.Elbow - B.Elbow 10 54 ?49 28.8  R Peroneal - EDB     Ankle EDB 4.3 ?6.5 4.4 ?2.0 100 Ankle - EDB 9   15.1     Fib head EDB 10.6  3.8  84.5 Fib head - Ankle 29 46 ?44 13.5     Pop fossa EDB 12.8  3.7  98.2 Pop fossa - Fib head 10 46 ?44 13.9         Pop fossa - Ankle      R Tibial - AH     Ankle AH  4.5 ?5.8 16.3 ?4.0 100 Ankle - AH 9   36.9     Pop fossa AH 12.9  12.1  74.4 Pop fossa - Ankle 38 45 ?41 33.1             SNC    Nerve / Sites Rec. Site Peak Lat Ref.  Amp Ref. Segments Distance Peak Diff Ref.    ms ms V V  cm ms ms  R Sural - Ankle (Calf)     Calf Ankle 4.4 ?4.4 15 ?6 Calf - Ankle 14    R Superficial peroneal - Ankle     Lat leg Ankle 4.1 ?4.4 9 ?6 Lat leg - Ankle 14    R Median, Ulnar - Transcarpal comparison     Median Palm Wrist 2.1 ?2.2 112 ?35 Median Palm - Wrist 8       Ulnar Palm Wrist 2.1 ?2.2 29 ?12 Ulnar Palm - Wrist 8          Median Palm - Ulnar Palm  0.0 ?0.4  R Median - Orthodromic (Dig II, Mid palm)     Dig II  Wrist 3.0 ?3.4 17 ?10 Dig II - Wrist 13    R Ulnar - Orthodromic, (Dig V, Mid palm)     Dig V Wrist 2.8 ?3.1 10 ?5 Dig V - Wrist 74                 F  Wave    Nerve F Lat Ref.   ms ms  R Tibial - AH 55.4 ?56.0  R Ulnar - ADM 28.4 ?32.0        EMG Summary Table    Spontaneous MUAP Recruitment  Muscle IA Fib PSW Fasc Other Amp Dur. Poly Pattern  R. First dorsal interosseous Normal None None None _______ Normal Normal Normal Normal  R. Abductor pollicis brevis Increased None None None _______ Normal Normal Normal Reduced  R. Pronator teres Normal None None None _______ Normal Normal Normal Normal  R. Biceps brachii Normal None None None _______ Normal Normal Normal Normal  R. Deltoid Normal None None None _______ Normal Normal Normal Normal  R. Triceps brachii Normal None None None _______ Normal Normal Normal Normal   R. Tibialis anterior Increased None None None _______ Normal Normal Normal Reduced  R. Tibialis posterior Normal None None None _______ Normal Normal Normal Reduced  R. Peroneus longus Normal None None None _______ Normal Normal Normal Reduced  R. Vastus lateralis Normal None None None _______ Normal Normal Normal Normal  R. Vastus medialis Normal None None None _______ Normal Normal Normal Normal

## 2019-10-29 ENCOUNTER — Ambulatory Visit: Payer: Managed Care, Other (non HMO) | Admitting: Registered Nurse

## 2019-11-13 ENCOUNTER — Other Ambulatory Visit: Payer: Self-pay | Admitting: Neurology

## 2019-12-25 ENCOUNTER — Ambulatory Visit: Payer: Managed Care, Other (non HMO) | Admitting: Orthopedic Surgery

## 2019-12-30 ENCOUNTER — Other Ambulatory Visit: Payer: Self-pay | Admitting: Registered Nurse

## 2019-12-30 DIAGNOSIS — I10 Essential (primary) hypertension: Secondary | ICD-10-CM

## 2020-02-02 ENCOUNTER — Other Ambulatory Visit: Payer: Self-pay | Admitting: Registered Nurse

## 2020-02-02 DIAGNOSIS — I10 Essential (primary) hypertension: Secondary | ICD-10-CM

## 2020-03-28 ENCOUNTER — Ambulatory Visit (HOSPITAL_COMMUNITY)
Admission: EM | Admit: 2020-03-28 | Discharge: 2020-03-28 | Disposition: A | Discharge status: Home / Self Care | Payer: Managed Care, Other (non HMO) | Attending: Student | Admitting: Student

## 2020-03-28 ENCOUNTER — Other Ambulatory Visit: Payer: Self-pay

## 2020-03-28 ENCOUNTER — Encounter (HOSPITAL_COMMUNITY): Payer: Self-pay

## 2020-03-28 DIAGNOSIS — I10 Essential (primary) hypertension: Secondary | ICD-10-CM | POA: Insufficient documentation

## 2020-03-28 DIAGNOSIS — K047 Periapical abscess without sinus: Secondary | ICD-10-CM | POA: Insufficient documentation

## 2020-03-28 DIAGNOSIS — K0889 Other specified disorders of teeth and supporting structures: Secondary | ICD-10-CM | POA: Diagnosis not present

## 2020-03-28 LAB — CBC
HCT: 38.6 % — ABNORMAL LOW (ref 39.0–52.0)
Hemoglobin: 13.6 g/dL (ref 13.0–17.0)
MCH: 28.8 pg (ref 26.0–34.0)
MCHC: 35.2 g/dL (ref 30.0–36.0)
MCV: 81.6 fL (ref 80.0–100.0)
Platelets: 224 10*3/uL (ref 150–400)
RBC: 4.73 MIL/uL (ref 4.22–5.81)
RDW: 12.7 % (ref 11.5–15.5)
WBC: 4.5 10*3/uL (ref 4.0–10.5)
nRBC: 0 % (ref 0.0–0.2)

## 2020-03-28 MED ORDER — LIDOCAINE VISCOUS HCL 2 % MT SOLN: 15.0000 mL | OROMUCOSAL | 0 refills | Status: DC | PRN

## 2020-03-28 MED ORDER — AMOXICILLIN-POT CLAVULANATE 875-125 MG PO TABS: 1.0000 | ORAL_TABLET | Freq: Two times a day (BID) | ORAL | 0 refills | Status: DC

## 2020-03-28 NOTE — ED Triage Notes (Signed)
Pt report dental pain and chills x 1 day. States pain radiates to left side of the neck. Pt was not able to sleep due to pain. Tylenol gives no relief.

## 2020-03-28 NOTE — Discharge Instructions (Addendum)
-  Start the antibiotic- augmentin- twice daily for 7 days. This antibiotic will heal your infection.  -Also try the lidocaine mouthwash for your dental pain. Make sure not to eat for at least 1 hour after using this, as your mouth will be very numb and you could bite yourself. -Keep your follow-up appointment with your PCP on Tuesday.  -We're checking a blood test to make sure there's no infection in your blood. This should come back this afternoon. -If you develop worsening headaches, vision changes, shortness of breath, chest pain, dizziness, etc- head straight to the ED and call 911.
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
## 2020-03-31 ENCOUNTER — Ambulatory Visit: Payer: Managed Care, Other (non HMO) | Admitting: Registered Nurse

## 2020-04-01 ENCOUNTER — Ambulatory Visit: Payer: Managed Care, Other (non HMO) | Admitting: Registered Nurse

## 2020-08-08 ENCOUNTER — Other Ambulatory Visit: Payer: Self-pay | Admitting: Registered Nurse

## 2020-08-08 DIAGNOSIS — I10 Essential (primary) hypertension: Secondary | ICD-10-CM

## 2020-08-24 ENCOUNTER — Other Ambulatory Visit: Payer: Self-pay | Admitting: Registered Nurse

## 2020-08-24 DIAGNOSIS — I10 Essential (primary) hypertension: Secondary | ICD-10-CM

## 2020-09-08 ENCOUNTER — Encounter: Payer: Self-pay | Admitting: Family Medicine

## 2020-09-08 DIAGNOSIS — D573 Sickle-cell trait: Secondary | ICD-10-CM | POA: Insufficient documentation

## 2020-09-09 ENCOUNTER — Encounter: Payer: Self-pay | Admitting: Family Medicine

## 2020-09-09 ENCOUNTER — Ambulatory Visit (INDEPENDENT_AMBULATORY_CARE_PROVIDER_SITE_OTHER): Payer: Managed Care, Other (non HMO) | Admitting: Family Medicine

## 2020-09-09 ENCOUNTER — Other Ambulatory Visit: Payer: Self-pay

## 2020-09-09 VITALS — BP 125/83 | HR 68 | Ht 67.0 in | Wt 142.5 lb

## 2020-09-09 DIAGNOSIS — Z1211 Encounter for screening for malignant neoplasm of colon: Secondary | ICD-10-CM | POA: Diagnosis not present

## 2020-09-09 DIAGNOSIS — Z1159 Encounter for screening for other viral diseases: Secondary | ICD-10-CM | POA: Diagnosis not present

## 2020-09-09 DIAGNOSIS — M2550 Pain in unspecified joint: Secondary | ICD-10-CM | POA: Insufficient documentation

## 2020-09-09 DIAGNOSIS — Z114 Encounter for screening for human immunodeficiency virus [HIV]: Secondary | ICD-10-CM

## 2020-09-09 DIAGNOSIS — H9193 Unspecified hearing loss, bilateral: Secondary | ICD-10-CM

## 2020-09-09 DIAGNOSIS — H905 Unspecified sensorineural hearing loss: Secondary | ICD-10-CM | POA: Insufficient documentation

## 2020-09-09 DIAGNOSIS — H9190 Unspecified hearing loss, unspecified ear: Secondary | ICD-10-CM | POA: Insufficient documentation

## 2020-09-09 DIAGNOSIS — R339 Retention of urine, unspecified: Secondary | ICD-10-CM

## 2020-09-09 DIAGNOSIS — I10 Essential (primary) hypertension: Secondary | ICD-10-CM

## 2020-09-09 NOTE — Progress Notes (Signed)
    SUBJECTIVE:   CHIEF COMPLAINT / HPI:   HTN: He is compliant with his Lisinopril 10 mg QD. He is here to establish care.  Joint pain:  C/O multiple joint pain for many years. No acute change from his baseline. Currently not taking any pain medications. He wants the provider to be aware of this.   Urinary system:  Incomplete bladder emptying has been ongoing for years but has worsened over the past months. He denies a change in his urine color. He does experience discomfort with urination. However, he denies dysuria or blood in his urine.   Reduced hearing:  Hearing loss worsening over the past years. He denies pain or ear discharge.  PERTINENT  PMH / PSH: PMX reviewed  OBJECTIVE:   BP 125/83   Pulse 68   Ht '5\' 7"'$  (1.702 m)   Wt 142 lb 8 oz (64.6 kg)   SpO2 100%   BMI 22.32 kg/m   Physical Exam Vitals and nursing note reviewed.  HENT:     Head: Normocephalic.  Cardiovascular:     Rate and Rhythm: Normal rate and regular rhythm.     Heart sounds: Normal heart sounds. No murmur heard. Pulmonary:     Effort: Pulmonary effort is normal. No respiratory distress.     Breath sounds: Normal breath sounds. No wheezing.  Abdominal:     General: Abdomen is flat. Bowel sounds are normal. There is no distension.     Palpations: Abdomen is soft. There is no mass.     Tenderness: There is no abdominal tenderness.  Musculoskeletal:     Right lower leg: No edema.     Left lower leg: No edema.  Neurological:     Mental Status: He is oriented to person, place, and time.     Sensory: No sensory deficit.     Gait: Gait normal.     Comments: No sensory loss of his feet   Hearing Screening   '250Hz'$  '500Hz'$  '1000Hz'$  '2000Hz'$  '4000Hz'$   Right ear Pass Fail Fail Fail Pass  Left ear Pass Fail Fail Fail Fail     ASSESSMENT/PLAN:   ESSENTIAL HYPERTENSION, BENIGN BP  Looks good on his current regimen. No medication adjustment at this time. Cmet, FLP checked today.   Multiple joint  pain Mostly knees and ankle. May use Tylenol as needed. Monitor for now.   Incomplete bladder emptying Likely BPH. Referral to urology discussed. Referral order placed. We will discuss Flomax at his next appointment.   Hearing loss Failed hearing test. Discussed ENT referral. He agrees with plan. Referral order placed.     Health maintenance: He would like to defer COVID-19 shot and other vaccinations till his next appointment. I discussed various colon cancer screening options. He opted for cologuard. Cologuard ordered. HIV and Hep C screening completed today.  More than 50% of this 40 minutes face to face appointment was spent on screening, counseling and coordination of care.  Andrena Mews, MD [place]

## 2020-09-09 NOTE — Assessment & Plan Note (Signed)
Mostly knees and ankle. May use Tylenol as needed. Monitor for now.

## 2020-09-09 NOTE — Patient Instructions (Signed)
Benign Prostatic Hyperplasia  Benign prostatic hyperplasia (BPH) is an enlarged prostate gland that is caused by the normal aging process and not by cancer. The prostate is a walnut-sized gland that is involved in the production of semen. It is located in front of the rectum and below the bladder. The bladder stores urine and the urethra is the tube that carries the urine out of the body. The prostate may get bigger asa man gets older. An enlarged prostate can press on the urethra. This can make it harder to pass urine. The build-up of urine in the bladder can cause infection. Back pressure and infection may progress to bladder damage and kidney (renal) failure. What are the causes? This condition is part of a normal aging process. However, not all men develop problems from this condition. If the prostate enlarges away from the urethra, urine flow will not be blocked. If it enlarges toward the urethra andcompresses it, there will be problems passing urine. What increases the risk? This condition is more likely to develop in men over the age of 50 years. What are the signs or symptoms? Symptoms of this condition include: Getting up often during the night to urinate. Needing to urinate frequently during the day. Difficulty starting urine flow. Decrease in size and strength of your urine stream. Leaking (dribbling) after urinating. Inability to pass urine. This needs immediate treatment. Inability to completely empty your bladder. Pain when you pass urine. This is more common if there is also an infection. Urinary tract infection (UTI). How is this diagnosed? This condition is diagnosed based on your medical history, a physical exam, and your symptoms. Tests will also be done, such as: A post-void bladder scan. This measures any amount of urine that may remain in your bladder after you finish urinating. A digital rectal exam. In a rectal exam, your health care provider checks your prostate by  putting a lubricated, gloved finger into your rectum to feel the back of your prostate gland. This exam detects the size of your gland and any abnormal lumps or growths. An exam of your urine (urinalysis). A prostate specific antigen (PSA) screening. This is a blood test used to screen for prostate cancer. An ultrasound. This test uses sound waves to electronically produce a picture of your prostate gland. Your health care provider may refer you to a specialist in kidney and prostate diseases (urologist). How is this treated? Once symptoms begin, your health care provider will monitor your condition (active surveillance or watchful waiting). Treatment for this condition will depend on the severity of your condition. Treatment may include: Observation and yearly exams. This may be the only treatment needed if your condition and symptoms are mild. Medicines to relieve your symptoms, including: Medicines to shrink the prostate. Medicines to relax the muscle of the prostate. Surgery in severe cases. Surgery may include: Prostatectomy. In this procedure, the prostate tissue is removed completely through an open incision or with a laparoscope or robotics. Transurethral resection of the prostate (TURP). In this procedure, a tool is inserted through the opening at the tip of the penis (urethra). It is used to cut away tissue of the inner core of the prostate. The pieces are removed through the same opening of the penis. This removes the blockage. Transurethral incision (TUIP). In this procedure, small cuts are made in the prostate. This lessens the prostate's pressure on the urethra. Transurethral microwave thermotherapy (TUMT). This procedure uses microwaves to create heat. The heat destroys and removes a small   amount of prostate tissue. Transurethral needle ablation (TUNA). This procedure uses radio frequencies to destroy and remove a small amount of prostate tissue. Interstitial laser coagulation (ILC).  This procedure uses a laser to destroy and remove a small amount of prostate tissue. Transurethral electrovaporization (TUVP). This procedure uses electrodes to destroy and remove a small amount of prostate tissue. Prostatic urethral lift. This procedure inserts an implant to push the lobes of the prostate away from the urethra. Follow these instructions at home: Take over-the-counter and prescription medicines only as told by your health care provider. Monitor your symptoms for any changes. Contact your health care provider with any changes. Avoid drinking large amounts of liquid before going to bed or out in public. Avoid or reduce how much caffeine or alcohol you drink. Give yourself time when you urinate. Keep all follow-up visits as told by your health care provider. This is important. Contact a health care provider if: You have unexplained back pain. Your symptoms do not get better with treatment. You develop side effects from the medicine you are taking. Your urine becomes very dark or has a bad smell. Your lower abdomen becomes distended and you have trouble passing your urine. Get help right away if: You have a fever or chills. You suddenly cannot urinate. You feel lightheaded, or very dizzy, or you faint. There are large amounts of blood or clots in the urine. Your urinary problems become hard to manage. You develop moderate to severe low back or flank pain. The flank is the side of your body between the ribs and the hip. These symptoms may represent a serious problem that is an emergency. Do not wait to see if the symptoms will go away. Get medical help right away. Call your local emergency services (911 in the [place] Do not drive yourself to the hospital. Summary Benign prostatic hyperplasia (BPH) is an enlarged prostate that is caused by the normal aging process and not by cancer. An enlarged prostate can press on the urethra. This can make it hard to pass urine. This  condition is part of a normal aging process and is more likely to develop in men over the age of 50 years. Get help right away if you suddenly cannot urinate. This information is not intended to replace advice given to you by your health care provider. Make sure you discuss any questions you have with your healthcare provider. Document Revised: 09/13/2019 Document Reviewed: 09/13/2019 Elsevier Patient Education  2022 Elsevier Inc.  

## 2020-09-09 NOTE — Assessment & Plan Note (Addendum)
BP  Looks good on his current regimen. No medication adjustment at this time. Cmet, FLP checked today.

## 2020-09-09 NOTE — Assessment & Plan Note (Addendum)
Likely BPH. Referral to urology discussed. Referral order placed. We will discuss Flomax at his next appointment.

## 2020-09-09 NOTE — Assessment & Plan Note (Signed)
Failed hearing test. Discussed ENT referral. He agrees with plan. Referral order placed.

## 2020-09-10 ENCOUNTER — Telehealth: Payer: Self-pay | Admitting: Family Medicine

## 2020-09-10 LAB — CMP14+EGFR
ALT: 9 IU/L (ref 0–44)
AST: 20 IU/L (ref 0–40)
Albumin/Globulin Ratio: 1.6 (ref 1.2–2.2)
Albumin: 4.2 g/dL (ref 3.8–4.9)
Alkaline Phosphatase: 111 IU/L (ref 44–121)
BUN/Creatinine Ratio: 17 (ref 10–24)
BUN: 17 mg/dL (ref 8–27)
Bilirubin Total: 0.6 mg/dL (ref 0.0–1.2)
CO2: 22 mmol/L (ref 20–29)
Calcium: 9.7 mg/dL (ref 8.6–10.2)
Chloride: 106 mmol/L (ref 96–106)
Creatinine, Ser: 0.98 mg/dL (ref 0.76–1.27)
Globulin, Total: 2.6 g/dL (ref 1.5–4.5)
Glucose: 82 mg/dL (ref 65–99)
Potassium: 4.4 mmol/L (ref 3.5–5.2)
Sodium: 140 mmol/L (ref 134–144)
Total Protein: 6.8 g/dL (ref 6.0–8.5)
eGFR: 88 mL/min/{1.73_m2} (ref >59)

## 2020-09-10 LAB — HIV ANTIBODY (ROUTINE TESTING W REFLEX): HIV Screen 4th Generation wRfx: NONREACTIVE

## 2020-09-10 LAB — LIPID PANEL
Chol/HDL Ratio: 2.4 ratio (ref 0.0–5.0)
Cholesterol, Total: 120 mg/dL (ref 100–199)
HDL: 51 mg/dL (ref >39)
LDL Chol Calc (NIH): 58 mg/dL (ref 0–99)
Triglycerides: 43 mg/dL (ref 0–149)
VLDL Cholesterol Cal: 11 mg/dL (ref 5–40)

## 2020-09-10 LAB — HEPATITIS C ANTIBODY: Hep C Virus Ab: <0.1 s/co ratio (ref 0.0–0.9)

## 2020-09-10 NOTE — Telephone Encounter (Signed)
Patient LVM on nurse returning phone call to provider. Attempted to call patient back. No answer, left HIPAA compliant VM for patient to return call to office.   Talbot Grumbling, RN

## 2020-09-10 NOTE — Telephone Encounter (Signed)
Unable to reach the patient and was unable to leave a message.  Please, advise him that all his test results were normal. Thanks.

## 2020-09-23 ENCOUNTER — Telehealth: Payer: Self-pay | Admitting: Family Medicine

## 2020-09-23 ENCOUNTER — Encounter: Payer: Managed Care, Other (non HMO) | Admitting: Family Medicine

## 2020-09-23 NOTE — Telephone Encounter (Signed)
He missed his appointment today. I called and discussed our no-show policy. He stated that he actually came in for his appointment but was 1 hr late, since he mixed up his time. He was sorry about this and did reschedule his appointment.   NB: I was not notified of his late arrival.

## 2020-09-30 ENCOUNTER — Encounter: Payer: Self-pay | Admitting: Family Medicine

## 2020-09-30 ENCOUNTER — Other Ambulatory Visit: Payer: Self-pay

## 2020-09-30 ENCOUNTER — Ambulatory Visit (INDEPENDENT_AMBULATORY_CARE_PROVIDER_SITE_OTHER): Payer: Managed Care, Other (non HMO) | Admitting: Family Medicine

## 2020-09-30 VITALS — BP 134/80 | HR 65 | Ht 67.0 in | Wt 148.1 lb

## 2020-09-30 DIAGNOSIS — R7309 Other abnormal glucose: Secondary | ICD-10-CM | POA: Diagnosis not present

## 2020-09-30 DIAGNOSIS — I10 Essential (primary) hypertension: Secondary | ICD-10-CM

## 2020-09-30 DIAGNOSIS — Z0000 Encounter for general adult medical examination without abnormal findings: Secondary | ICD-10-CM | POA: Diagnosis not present

## 2020-09-30 LAB — POCT GLYCOSYLATED HEMOGLOBIN (HGB A1C): Hemoglobin A1C: 5.3 % (ref 4.0–5.6)

## 2020-09-30 MED ORDER — LISINOPRIL 10 MG PO TABS: 10.0000 mg | ORAL_TABLET | Freq: Every day | ORAL | 2 refills | Status: DC

## 2020-09-30 NOTE — Progress Notes (Signed)
Subjective:     Joseph Tyler is a 60 y.o. male and is here for a comprehensive physical exam. The patient reports no problems. Wants to get tested for DM.  Social History   Socioeconomic History   Marital status: Married    Spouse name: Not on file   Number of children: 4   Years of education: some college   Highest education level: Not on file  Occupational History   Occupation: Radiographer, therapeutic  Tobacco Use   Smoking status: Never   Smokeless tobacco: Never  Vaping Use   Vaping Use: Never used  Substance and Sexual Activity   Alcohol use: No   Drug use: No   Sexual activity: Yes    Partners: Female    Birth control/protection: Post-menopausal  Other Topics Concern   Not on file  Social History Narrative   Lives at home with family. Married, 4 children   Works for a [employer]; has 2 jobs.    Daily caffeine use - 4/day    Right-handed.   Social Determinants of Health   Financial Resource Strain: Not on file  Food Insecurity: Not on file  Transportation Needs: Not on file  Physical Activity: Not on file  Stress: Not on file  Social Connections: Not on file  Intimate Partner Violence: Not on file   Health Maintenance  Topic Date Due   TETANUS/TDAP  Never done   Zoster Vaccines- Shingrix (1 of 2) Never done   COLONOSCOPY (Pts 45-33yr Insurance coverage will need to be confirmed)  07/16/2019   INFLUENZA VACCINE  Never done   COVID-19 Vaccine (4 - Booster for PClimaxseries) 07/01/2021 (Originally 05/23/2020)   Hepatitis C Screening  Completed   HIV Screening  Completed   HPV VACCINES  Aged Out   Pneumococcal Vaccine [id number] old  Discontinued    The following portions of the patient's history were reviewed and updated as appropriate: allergies, current medications, past family history, past medical history, past social history, past surgical history, and problem list.  Review of Systems Pertinent items are noted in HPI.    Objective:    BP 134/80   Pulse 65   Ht _0  (1.702 m)   Wt 148 lb 2 oz (67.2 kg)   SpO2 99%   BMI 23.20 kg/m  General appearance: alert and cooperative Head: Normocephalic, without obvious abnormality, atraumatic Eyes: conjunctivae/corneas clear. PERRL, EOM's intact. Fundi benign. Ears: normal TM's and external ear canals both ears Throat: lips, mucosa, and tongue normal; teeth and gums normal Lungs: clear to auscultation bilaterally Heart: regular rate and rhythm, S1, S2 normal, no murmur, click, rub or gallop Abdomen: soft, non-tender; bowel sounds normal; no masses,  no organomegaly Extremities: extremities normal, atraumatic, no cyanosis or edema Neurologic: Alert and oriented X 3, normal strength and tone. Normal symmetric reflexes. Normal coordination and gait    Assessment:    Healthy male exam. Normal exam     Plan:  Tdap and Flu shot recommended.  He declined both. Cologuard ordered during last visit for colon cancer screen. He is yet to receive his cologuard kit in the mail. Recent lab work reviewed and was normal. A1C checked at his request and is negative for DM - patient called and informed. BP med refilled. He is doing well otherwise.  NB: He missed his call from urologist's office. We gave him information about referral and the callback number for an appointment.   See After Visit Summary  for Counseling Recommendations

## 2020-09-30 NOTE — Patient Instructions (Signed)

## 2020-11-28 ENCOUNTER — Ambulatory Visit: Payer: Managed Care, Other (non HMO) | Admitting: Family Medicine

## 2021-04-15 IMAGING — MR MR FOOT*L* W/O CM
4 of 5 series · 21 of 40 positions shown · non-contrast
Comparison: X-ray 11/22/2018

CLINICAL DATA: Medial left foot pain localized to the first
metatarsal base.

EXAM:
MRI OF THE LEFT FOOT WITHOUT CONTRAST
TECHNIQUE: Multiplanar, multisequence MR imaging of the left forefoot was
performed. No intravenous contrast was administered.

[Series 7: T2 fat-sat · coronal · 4.0mm · 0.23mm/px · 9 of 33 slices shown (1 of 3)]
[im 1/33]
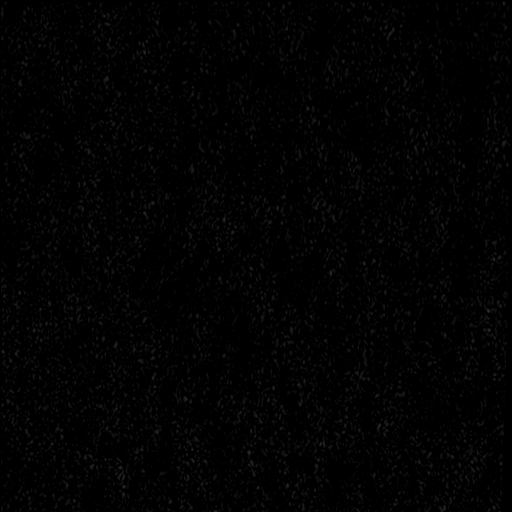
[im 5/33]
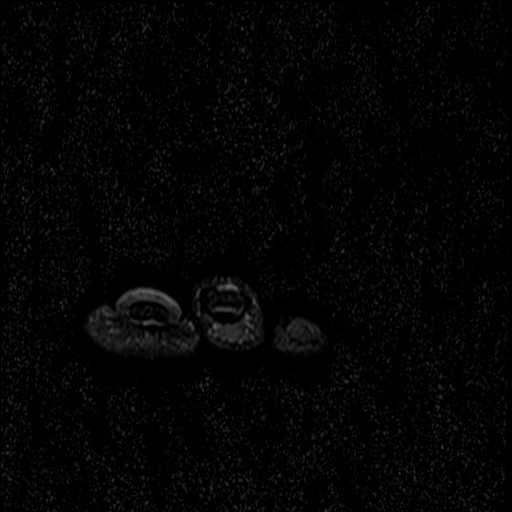
[im 9/33]
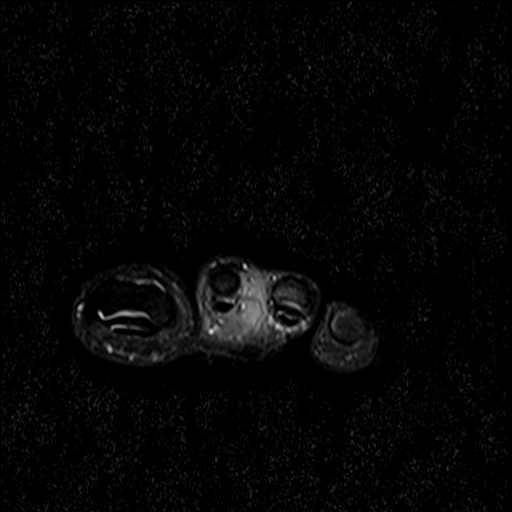
[im 13/33]
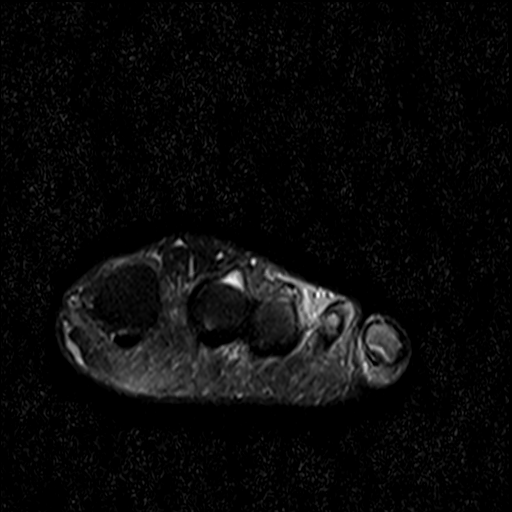
[im 17/33]
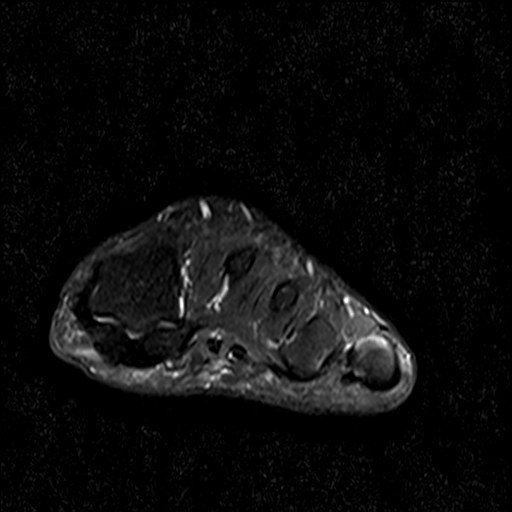
[im 21/33]
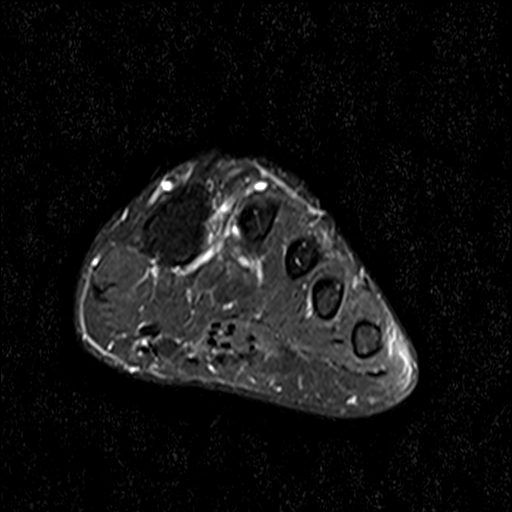
[im 25/33]
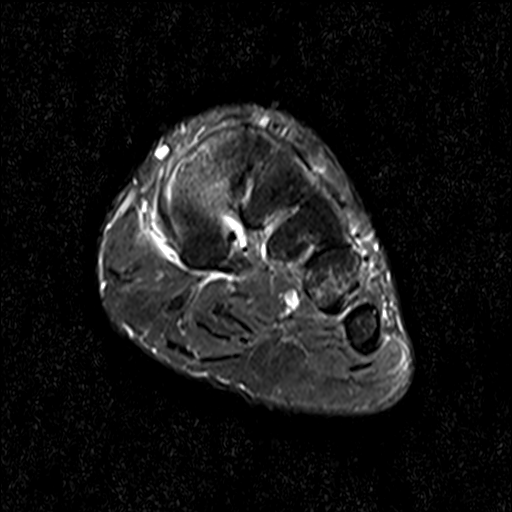
[im 29/33]
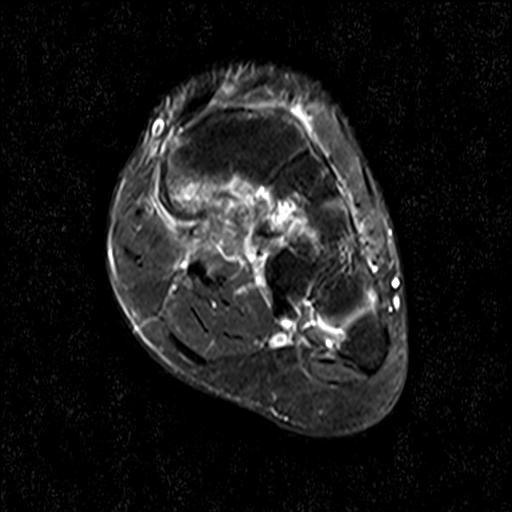
[im 33/33]
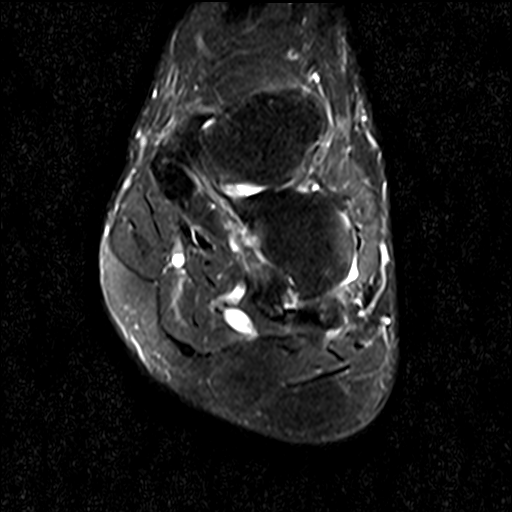

[Series 8: T1 · coronal · 4.0mm · 0.47mm/px · 3 of 33 slices shown]
[im 5/33]
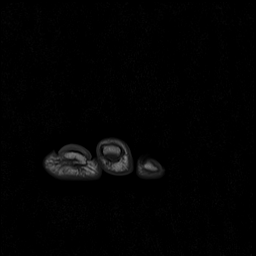
[im 17/33]
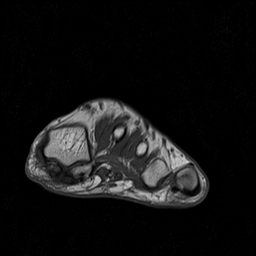
[im 29/33]
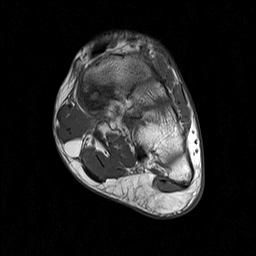

[Series 10: T2 fat-sat · axial · 2.5mm · 0.35mm/px · z∈[-51,+4]mm · 6 of 30 slices shown (2 of 3)]
[im 1/30]
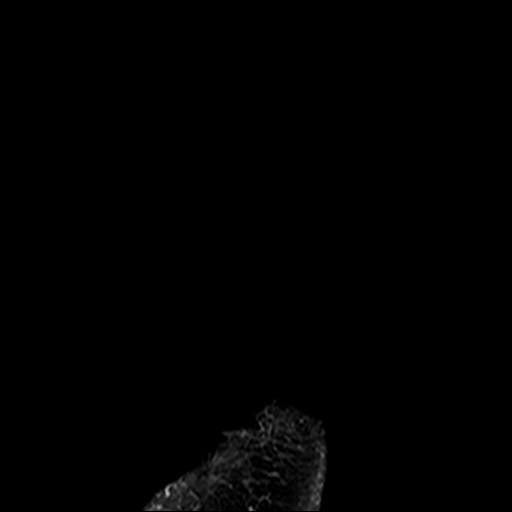
[im 5/30]
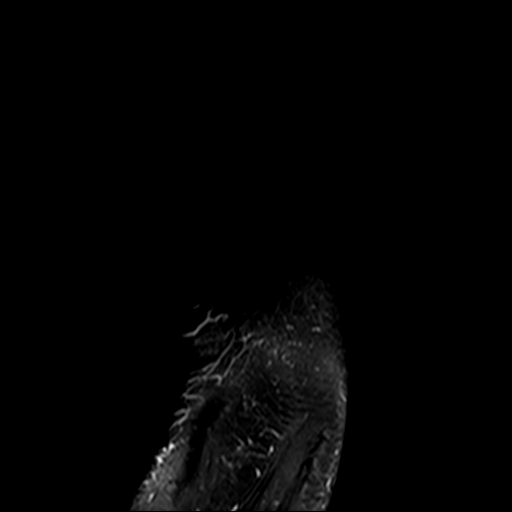
[im 9/30]
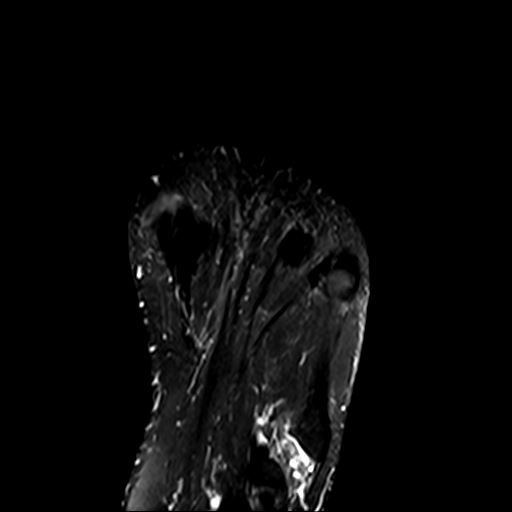
[im 13/30]
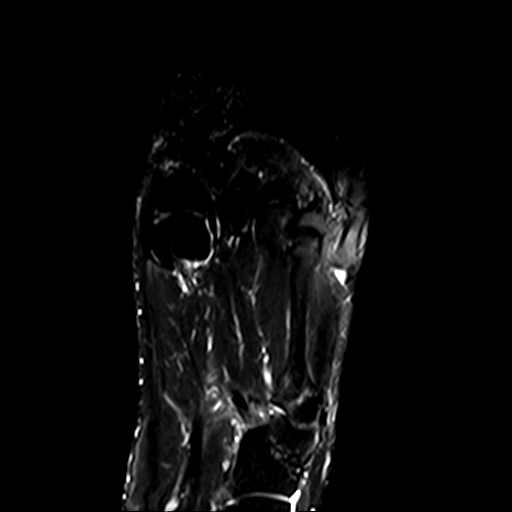
[im 17/30]
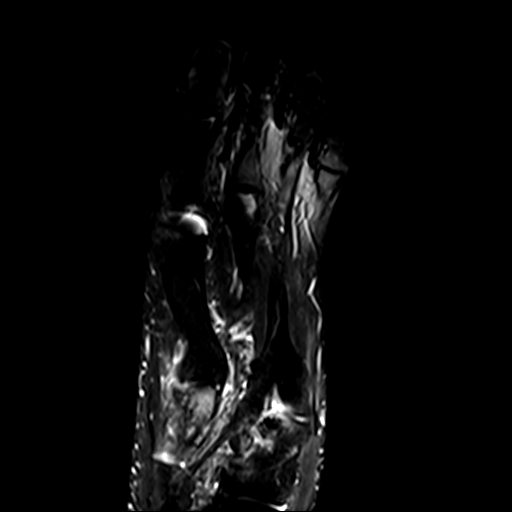
[im 25/30]
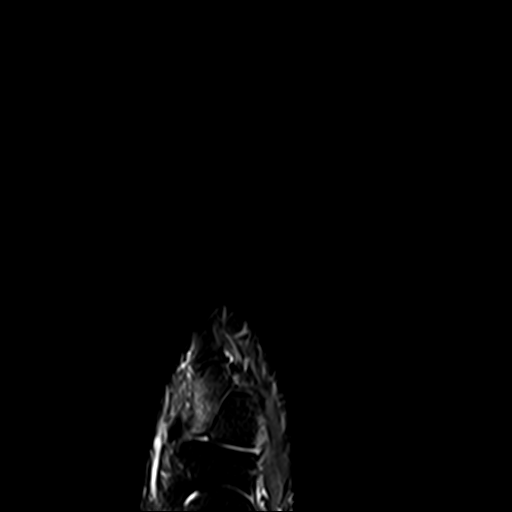

[Series 11: T2 fat-sat · sagittal · 3.0mm · 0.35mm/px · 3 of 24 slices shown (3 of 3)]
[im 5/24]
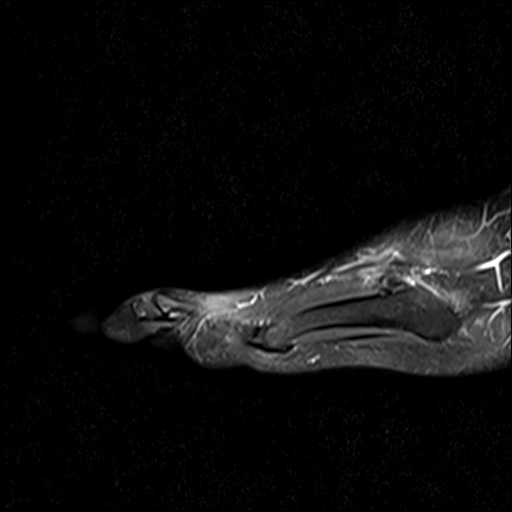
[im 14/24]
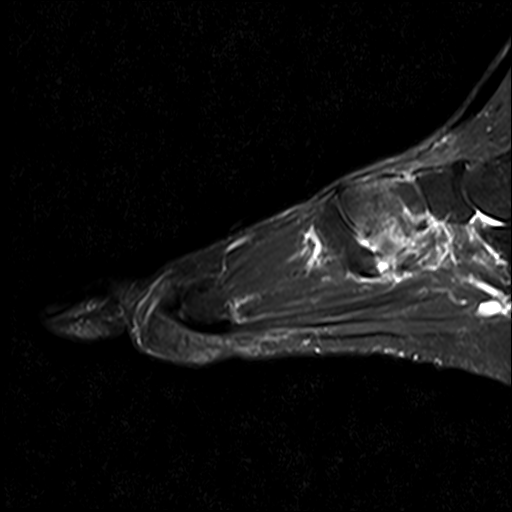
[im 24/24]
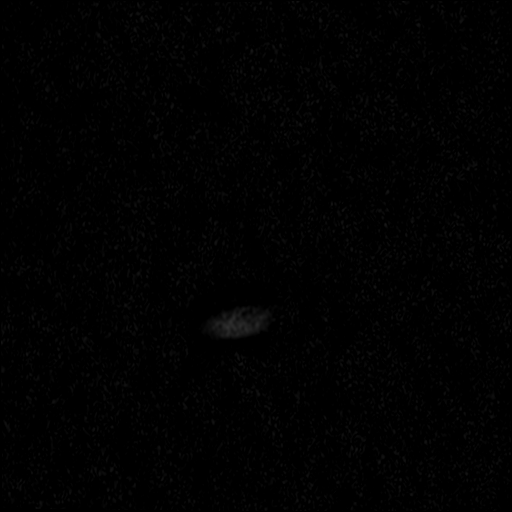

[21 of 40 positions shown; findings below may reference images not displayed]

FINDINGS: Bones/Joint/Cartilage

There is diffusely low T1 signal changes throughout the medial
cuneiform of the left foot (series 9, image 11) with associated T2
hyperintense signal and a 1.2 cm area of internal cystic change
(series 10, image 12). No fragmentation or collapse of the bone.
Mild reactive subchondral marrow change within the plantar aspect of
the intermediate cuneiform as well as at the adjacent second
metatarsal base medially. Otherwise, bone marrow signal intensity is
within normal limits. No fracture. No bone lesion. No joint
effusion.

Ligaments

LisFranc ligament intact. Remaining intrinsic ligaments of the
forefoot are intact.

Muscles and Tendons

Normal muscle bulk and signal intensity without atrophy or fatty
infiltration. Flexor and extensor tendons are intact. No
tenosynovitis.

Soft tissues

There is mild soft tissue edema surrounding the medial cuneiform,
particularly along its medial and plantar margins. No soft tissue
fluid collection or hematoma.
IMPRESSION: 1. Extensive abnormal marrow signal and intraosseous cystic change
throughout the medial cuneiform of the left foot. Findings are most
suggestive of osteonecrosis. No fragmentation or collapse of the
cuneiform and extensive T2 hyperintense signal suggests an acute to
subacute chronicity to this finding.
2. Mild soft tissue edema surrounding the medial cuneiform of the
left foot without soft tissue fluid collection or hematoma.
3. Mild reactive subchondral marrow change of the plantar aspect of
the intermediate cuneiform as well as at the second metatarsal base
medially, likely degenerative.
4. No fracture or dislocation.

## 2021-07-02 ENCOUNTER — Other Ambulatory Visit: Payer: Self-pay

## 2021-07-02 DIAGNOSIS — I10 Essential (primary) hypertension: Secondary | ICD-10-CM

## 2021-07-02 MED ORDER — LISINOPRIL 10 MG PO TABS: 10.0000 mg | ORAL_TABLET | Freq: Every day | ORAL | 2 refills | Status: DC

## 2021-10-08 ENCOUNTER — Other Ambulatory Visit: Payer: Self-pay | Admitting: Family Medicine

## 2021-10-08 DIAGNOSIS — I10 Essential (primary) hypertension: Secondary | ICD-10-CM

## 2021-10-15 ENCOUNTER — Encounter: Payer: Self-pay | Admitting: Family Medicine

## 2021-10-15 DIAGNOSIS — Z8619 Personal history of other infectious and parasitic diseases: Secondary | ICD-10-CM | POA: Insufficient documentation

## 2021-10-16 ENCOUNTER — Ambulatory Visit (INDEPENDENT_AMBULATORY_CARE_PROVIDER_SITE_OTHER): Payer: Managed Care, Other (non HMO) | Admitting: Family Medicine

## 2021-10-16 ENCOUNTER — Encounter: Payer: Self-pay | Admitting: Family Medicine

## 2021-10-16 VITALS — BP 161/90 | HR 95 | Ht 67.0 in | Wt 155.2 lb

## 2021-10-16 DIAGNOSIS — Z0000 Encounter for general adult medical examination without abnormal findings: Secondary | ICD-10-CM | POA: Diagnosis not present

## 2021-10-16 DIAGNOSIS — Z23 Encounter for immunization: Secondary | ICD-10-CM | POA: Diagnosis not present

## 2021-10-16 DIAGNOSIS — D573 Sickle-cell trait: Secondary | ICD-10-CM

## 2021-10-16 DIAGNOSIS — R339 Retention of urine, unspecified: Secondary | ICD-10-CM

## 2021-10-16 DIAGNOSIS — Z114 Encounter for screening for human immunodeficiency virus [HIV]: Secondary | ICD-10-CM

## 2021-10-16 DIAGNOSIS — M79671 Pain in right foot: Secondary | ICD-10-CM

## 2021-10-16 DIAGNOSIS — E785 Hyperlipidemia, unspecified: Secondary | ICD-10-CM | POA: Diagnosis not present

## 2021-10-16 DIAGNOSIS — Z1211 Encounter for screening for malignant neoplasm of colon: Secondary | ICD-10-CM

## 2021-10-16 DIAGNOSIS — R413 Other amnesia: Secondary | ICD-10-CM | POA: Diagnosis not present

## 2021-10-16 DIAGNOSIS — H9193 Unspecified hearing loss, bilateral: Secondary | ICD-10-CM

## 2021-10-16 MED ORDER — TAMSULOSIN HCL 0.4 MG PO CAPS: 0.4000 mg | ORAL_CAPSULE | Freq: Every day | ORAL | 3 refills | Status: DC

## 2021-10-16 NOTE — Patient Instructions (Signed)
Tamsulosin Capsules What is this medication? TAMSULOSIN (tam SOO loe sin) treats the symptoms of an enlarged prostate (benign prostatic hyperplasia). It works by relaxing the muscles in the prostate and bladder, which makes it easier to urinate. It belongs to a group of medications called alpha blockers. This medicine may be used for other purposes; ask your health care provider or pharmacist if you have questions. COMMON BRAND NAME(S): Flomax What should I tell my care team before I take this medication? They need to know if you have any of the following conditions: Advanced kidney disease Advanced liver disease Low blood pressure Prostate cancer An unusual or allergic reaction to tamsulosin, sulfa drugs, other medications, foods, dyes, or preservatives Pregnant or trying to get pregnant Breast-feeding How should I use this medication? Take this medication by mouth about 30 minutes after the same meal every day. Follow the directions on the prescription label. Swallow the capsules whole with a glass of water. Do not crush, chew, or open capsules. Do not take your medication more often than directed. Do not stop taking your medication unless your care team tells you to. Talk to your care team about the use of this medication in children. Special care may be needed. Overdosage: If you think you have taken too much of this medicine contact a poison control center or emergency room at once. NOTE: This medicine is only for you. Do not share this medicine with others. What if I miss a dose? If you miss a dose, take it as soon as you can. If it is almost time for your next dose, take only that dose. Do not take double or extra doses. If you stop taking your medication for several days or more, ask your care team what dose you should start back on. What may interact with this medication? Cimetidine Fluoxetine Ketoconazole Medications for erectile dysfunction like sildenafil, tadalafil,  vardenafil Medications for high blood pressure Other alpha-blockers like alfuzosin, doxazosin, phentolamine, phenoxybenzamine, prazosin, terazosin Warfarin This list may not describe all possible interactions. Give your health care provider a list of all the medicines, herbs, non-prescription drugs, or dietary supplements you use. Also tell them if you smoke, drink alcohol, or use illegal drugs. Some items may interact with your medicine. What should I watch for while using this medication? Visit your care team for regular check-ups. You will need lab work done before you start this medication and regularly while you are taking it. Check your blood pressure as directed. Ask your care team what your blood pressure should be, and when you should contact him or her. This medication may make you feel dizzy or lightheaded. This is more likely to happen after the first dose, after an increase in dose, or during hot weather or exercise. Drinking alcohol and taking some medications can make this worse. Do not drive, use machinery, or do anything that needs mental alertness until you know how this medication affects you. Do not sit or stand up quickly. If you begin to feel dizzy, sit down until you feel better. These effects can decrease once your body adjusts to the medication. Contact your care team right away if you have an erection that lasts longer than 4 hours or if it becomes painful. This may be a sign of a serious problem and must be treated right away to prevent permanent damage. If you are thinking of having cataract surgery, tell your eye surgeon that you have taken this medication. What side effects may I notice from  receiving this medication? Side effects that you should report to your care team as soon as possible: Allergic reactions--skin rash, itching, hives, swelling of the face, lips, tongue, or throat Low blood pressure--dizziness, feeling faint or lightheaded, blurry vision Prolonged or  painful erection Side effects that usually do not require medical attention (report to your care team if they continue or are bothersome): Change in sex drive or performance Dizziness Headache Runny or stuffy nose This list may not describe all possible side effects. Call your doctor for medical advice about side effects. You may report side effects to FDA at 1-800-FDA-1088. Where should I keep my medication? Keep out of the reach of children. Store at room temperature between 15 and 30 degrees C (59 and 86 degrees F). Throw away any unused medication after the expiration date. NOTE: This sheet is a summary. It may not cover all possible information. If you have questions about this medicine, talk to your doctor, pharmacist, or health care provider.  2023 Elsevier/Gold Standard (2007-02-25 00:00:00)

## 2021-10-16 NOTE — Progress Notes (Signed)
SUBJECTIVE:   Chief compliant/HPI: annual examination  Joseph Tyler Radi is a 61 y.o. who presents today for an annual exam.  He also has multiple other complaints: Memory loss: He spaces out and forgets things a lot. No other neurologic concerns.  Hearing loss: This is chronic, and will like an ENT referral. No pain.  Joint pain and right heel pain: Ongoing for many years. Does not use pain meds.  Incomplete bladder emptying: Symptoms persist. He missed his urology referral appointment. He wants treatment and another referral.   [id number] with Lisinopril. He asked about his refills.   Reviewed and updated history Yes.   Review of systems form notable for as in the body of hx.    OBJECTIVE:   Vitals:   10/16/21 0949 10/16/21 1014  BP: (!) 152/81 (!) 161/90  Pulse: 95   SpO2: 98%   Weight: 155 lb 3.2 oz (70.4 kg)   Height: '5\' 7"'$  (1.702 m)     Physical Exam Vitals and nursing note reviewed.  HENT:     Head: Normocephalic and atraumatic.     Right Ear: Tympanic membrane and ear canal normal.     Left Ear: Tympanic membrane and ear canal normal.     Mouth/Throat:     Pharynx: No oropharyngeal exudate or posterior oropharyngeal erythema.  Eyes:     Extraocular Movements: Extraocular movements intact.     Pupils: Pupils are equal, round, and reactive to light.  Cardiovascular:     Rate and Rhythm: Normal rate and regular rhythm.     Heart sounds: Normal heart sounds. No murmur heard. Pulmonary:     Effort: Pulmonary effort is normal. No respiratory distress.     Breath sounds: Normal breath sounds. No wheezing.  Abdominal:     General: Abdomen is flat. Bowel sounds are normal. There is no distension.     Palpations: Abdomen is soft.  Musculoskeletal:        General: No swelling or tenderness.     Cervical back: Neck supple.     Right knee: Normal.     Left knee: Normal.     Comments: No heel tenderness B/L  Neurological:     General: No focal  deficit present.     Mental Status: He is alert and oriented to person, place, and time.  Psychiatric:        Behavior: Behavior normal.      ASSESSMENT/PLAN:   No problem-specific Assessment & Plan notes found for this encounter.    Annual Examination  See AVS for age appropriate recommendations  PHQ score 5, reviewed and discussed.  Blood pressure reviewed and is not at goal.   Advanced directive Discussed. Handout provided for review   Considered the following items based upon USPSTF recommendations: HIV testing: ordered Hepatitis C: not indicated Hepatitis B: not indicated Syphilis if at high risk: ordered GC/CTnot indicated Lipid panel (nonfasting or fasting) discussed based upon AHA recommendations and ordered.  Consider repeat every 4-6 years.  Reviewed risk factors for latent tuberculosis and not indicated Immunizations Tdap. He declined flu shot.  Colonoscopy discussed. He prefers cologuard. Test ordered.   Follow up in 1 year or sooner if indicated.   Other problems: Memory loss:  TSH, HIV, RPR, Vit B12 Consider MRI brain in the future.  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 Plantar fascitis Xray ordered. Ibuprofen as needed for pain.  Incomplete bladder emptying: Start Flomax. I discussed  S/E of hypotension. He does have HTN, hence, it should not be so much of a concern. Monitor BP closely at home. He will see me in 4 weeks for BP check. Urology referral completed.  HTN: Compliant with Lisinopril.  He already has refills. F/U in 4 weeks for BP check.   Andrena Mews, MD [place]

## 2021-10-19 ENCOUNTER — Telehealth: Payer: Self-pay | Admitting: Family Medicine

## 2021-10-19 DIAGNOSIS — G8929 Other chronic pain: Secondary | ICD-10-CM

## 2021-10-19 NOTE — Telephone Encounter (Signed)
Test result discussed. Mildly elevated Alk Phos. Previously negative Hep C. I discussed Hepatitis panel with Liver US in the future given chronicity. He prefer to discuss further at his appointment in 3 weeks.  +RPR discussed. Treponemal test pending. He denies previous + RPR or treatment. I will contact him soon with his final report. He agreed with the plan.  Right foot xray pending. He requested for left big toes xray too. He stated that although his right foot bothers him most, he sometimes have left great toe pain and will like to get an xray of it. Xray ordered.

## 2021-10-20 ENCOUNTER — Telehealth: Payer: Self-pay | Admitting: Family Medicine

## 2021-10-20 LAB — CMP14+EGFR
ALT: 9 IU/L (ref 0–44)
AST: 13 IU/L (ref 0–40)
Albumin/Globulin Ratio: 1.4 (ref 1.2–2.2)
Albumin: 4.1 g/dL (ref 3.9–4.9)
Alkaline Phosphatase: 144 IU/L — ABNORMAL HIGH (ref 44–121)
BUN/Creatinine Ratio: 17 (ref 10–24)
BUN: 17 mg/dL (ref 8–27)
Bilirubin Total: 0.6 mg/dL (ref 0.0–1.2)
CO2: 22 mmol/L (ref 20–29)
Calcium: 9.5 mg/dL (ref 8.6–10.2)
Chloride: 103 mmol/L (ref 96–106)
Creatinine, Ser: 0.98 mg/dL (ref 0.76–1.27)
Globulin, Total: 3 g/dL (ref 1.5–4.5)
Glucose: 82 mg/dL (ref 70–99)
Potassium: 4.5 mmol/L (ref 3.5–5.2)
Sodium: 140 mmol/L (ref 134–144)
Total Protein: 7.1 g/dL (ref 6.0–8.5)
eGFR: 88 mL/min/{1.73_m2} (ref >59)

## 2021-10-20 LAB — RPR: RPR Ser Ql: REACTIVE — AB

## 2021-10-20 LAB — LIPID PANEL
Chol/HDL Ratio: 2.9 ratio (ref 0.0–5.0)
Cholesterol, Total: 146 mg/dL (ref 100–199)
HDL: 50 mg/dL (ref >39)
LDL Chol Calc (NIH): 86 mg/dL (ref 0–99)
Triglycerides: 43 mg/dL (ref 0–149)
VLDL Cholesterol Cal: 10 mg/dL (ref 5–40)

## 2021-10-20 LAB — CBC
Hematocrit: 44.3 % (ref 37.5–51.0)
Hemoglobin: 14.4 g/dL (ref 13.0–17.7)
MCH: 27.1 pg (ref 26.6–33.0)
MCHC: 32.5 g/dL (ref 31.5–35.7)
MCV: 83 fL (ref 79–97)
Platelets: 220 10*3/uL (ref 150–450)
RBC: 5.31 x10E6/uL (ref 4.14–5.80)
RDW: 13.3 % (ref 11.6–15.4)
WBC: 3.9 10*3/uL (ref 3.4–10.8)

## 2021-10-20 LAB — RPR, QUANT+TP ABS (REFLEX)
Rapid Plasma Reagin, Quant: 1:2 {titer} — ABNORMAL HIGH
T Pallidum Abs: REACTIVE — AB

## 2021-10-20 LAB — PSA: Prostate Specific Ag, Serum: 1.6 ng/mL (ref 0.0–4.0)

## 2021-10-20 LAB — TSH: TSH: 1.15 u[IU]/mL (ref 0.450–4.500)

## 2021-10-20 LAB — HIV ANTIBODY (ROUTINE TESTING W REFLEX): HIV Screen 4th Generation wRfx: NONREACTIVE

## 2021-10-20 LAB — VITAMIN B12: Vitamin B-12: 381 pg/mL (ref 232–1245)

## 2021-10-20 NOTE — Telephone Encounter (Signed)
RPR + with + Treponemal. 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 I discussed this with the [facility] team, who suggested considering referral to ID vs. Neuro. I called and discussed the result with the patient treatment recommendation. He is adamant about wanting to avoid being referred to a specialist and prefers not to have LP done. We will schedule for Benzathine penicillin G 2.4 million units IM  weekly x 3 weeks.  Repeat titer in 6 months.  STD counseling provided and I advised him to advise his wife to get tested and treated too to prevent reinfection. He agreed with the plan.

## 2021-10-21 NOTE — Telephone Encounter (Signed)
Scheduled patient for first penicillin injection on Monday, 10/16.  Talbot Grumbling, RN

## 2021-11-02 ENCOUNTER — Ambulatory Visit (INDEPENDENT_AMBULATORY_CARE_PROVIDER_SITE_OTHER): Payer: Managed Care, Other (non HMO)

## 2021-11-02 ENCOUNTER — Ambulatory Visit (HOSPITAL_COMMUNITY)
Admission: RE | Admit: 2021-11-02 | Discharge: 2021-11-02 | Disposition: A | Discharge status: Home / Self Care | Payer: Managed Care, Other (non HMO) | Source: Ambulatory Visit | Attending: Family Medicine | Admitting: Family Medicine

## 2021-11-02 DIAGNOSIS — G8929 Other chronic pain: Secondary | ICD-10-CM | POA: Insufficient documentation

## 2021-11-02 DIAGNOSIS — M79671 Pain in right foot: Secondary | ICD-10-CM | POA: Insufficient documentation

## 2021-11-02 DIAGNOSIS — A539 Syphilis, unspecified: Secondary | ICD-10-CM | POA: Diagnosis not present

## 2021-11-02 DIAGNOSIS — M79675 Pain in left toe(s): Secondary | ICD-10-CM | POA: Diagnosis present

## 2021-11-02 MED ORDER — PENICILLIN G BENZATHINE 1200000 UNIT/2ML IM SUSY
1.2000 10*6.[IU] | PREFILLED_SYRINGE | Freq: Once | INTRAMUSCULAR | Status: AC
  Administered 2021-11-02: 1.2 10*6.[IU] via INTRAMUSCULAR

## 2021-11-02 NOTE — Progress Notes (Signed)
Patient in nurse clinic today for STD treatment of Syphilis.   Patient advised to abstain from sex for 7-10 days after treatment of self and partner.    Penicillin 1.2 million units x 1 given LUOQ and Penicillin 1.2 million units x 1 given RUOQ per Dr. Macario Golds orders.  Patient observed 15 minutes in office.  No reaction noted.   Patient scheduled for 2/3 treatment next week.  Provided condoms and advised to use with all sexual activity. Patient verbalized understanding.   STD report form fax completed and faxed to [facility] Department at [phone] (STD department).

## 2021-11-03 ENCOUNTER — Encounter: Payer: Self-pay | Admitting: Family Medicine

## 2021-11-03 ENCOUNTER — Other Ambulatory Visit: Payer: Self-pay | Admitting: Family Medicine

## 2021-11-03 DIAGNOSIS — M87875 Other osteonecrosis, left foot: Secondary | ICD-10-CM

## 2021-11-03 DIAGNOSIS — M7731 Calcaneal spur, right foot: Secondary | ICD-10-CM

## 2021-11-03 MED ORDER — IBUPROFEN 400 MG PO TABS: 400.0000 mg | ORAL_TABLET | Freq: Two times a day (BID) | ORAL | 0 refills | Status: DC | PRN

## 2021-11-03 NOTE — Progress Notes (Addendum)
He attended his wife's clinic appointment with her this morning. I seized the opportunity to discuss his test results with him.  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 Treatment discussed include pain management with NSAID, PT and surgical management. Since this has been ongoing for years and is now worsening, he requested surgical management. Referral placed to podiatrist. Ibuprofen escribed.     DG Foot Complete Right  Result Date: 11/03/2021 CLINICAL DATA:  Right heel pain EXAM: RIGHT FOOT COMPLETE - 3+ VIEW COMPARISON:  None Available. FINDINGS: No acute fracture or dislocation. Plantar and Achilles calcaneal spurs. Well corticated osseous fragment adjacent to the cuboid is likely an os peroneum. Small ankle joint effusion. IMPRESSION: No acute osseous abnormality.  Calcaneal spurs. Electronically Signed   By: Placido Sou M.D.   On: 11/03/2021 01:09   DG Foot Complete Left  Result Date: 11/03/2021 CLINICAL DATA:  Great toe pain EXAM: LEFT FOOT - COMPLETE 3+ VIEW COMPARISON:  Radiographs 11/22/2018 and MRI of the left foot 12/09/2018 FINDINGS: No acute fracture or dislocation. Diffuse sclerosis throughout the medial cuneiform. No fragmentation or collapse. Os peroneum. Plantar calcaneal spur. IMPRESSION: No acute osseous abnormality. Osteonecrosis of the medial cuneiform. Electronically Signed   By: Placido Sou M.D.   On: 11/03/2021 01:06

## 2021-11-09 ENCOUNTER — Ambulatory Visit (INDEPENDENT_AMBULATORY_CARE_PROVIDER_SITE_OTHER): Payer: Managed Care, Other (non HMO)

## 2021-11-09 DIAGNOSIS — A539 Syphilis, unspecified: Secondary | ICD-10-CM

## 2021-11-09 MED ORDER — PENICILLIN G BENZATHINE 1200000 UNIT/2ML IM SUSY
1.2000 10*6.[IU] | PREFILLED_SYRINGE | Freq: Once | INTRAMUSCULAR | Status: AC
  Administered 2021-11-09: 1.2 10*6.[IU] via INTRAMUSCULAR

## 2021-11-09 NOTE — Progress Notes (Signed)
Patient in nurse clinic today for STD treatment of Syphilis.    Patient advised to abstain from sex for 7-10 days after treatment of self and partner.     Penicillin 1.2 million units x 1 given LUOQ and Penicillin 1.2 million units x 1 given RUOQ per Dr. Macario Golds orders.  Patient observed 15 minutes in office.  No reaction noted.    Patient scheduled for 3/3 treatment next week.   Provided condoms and advised to use with all sexual activity. Patient verbalized understanding.    STD report form fax completed and faxed to [facility] Department at [phone] (STD department).

## 2021-11-11 ENCOUNTER — Encounter: Payer: Self-pay | Admitting: Family Medicine

## 2021-11-11 ENCOUNTER — Ambulatory Visit (INDEPENDENT_AMBULATORY_CARE_PROVIDER_SITE_OTHER): Payer: Managed Care, Other (non HMO) | Admitting: Family Medicine

## 2021-11-11 VITALS — BP 138/78 | HR 80 | Wt 156.8 lb

## 2021-11-11 DIAGNOSIS — M7731 Calcaneal spur, right foot: Secondary | ICD-10-CM | POA: Diagnosis not present

## 2021-11-11 DIAGNOSIS — M7661 Achilles tendinitis, right leg: Secondary | ICD-10-CM | POA: Insufficient documentation

## 2021-11-11 DIAGNOSIS — M87076 Idiopathic aseptic necrosis of unspecified foot: Secondary | ICD-10-CM

## 2021-11-11 MED ORDER — DICLOFENAC SODIUM 1 % EX GEL: 2.0000 g | Freq: Four times a day (QID) | CUTANEOUS | 2 refills | Status: DC

## 2021-11-11 NOTE — Progress Notes (Signed)
    CHIEF COMPLAINT / HPI: Pain on the left that has been chronic but seems much worse in the last few days.  Ankle/Achilles pain on the right that is also chronic and worsening but is less severe than the left foot.  Feels like the left foot pain is driving his blood pressure up because he can feel occasionally pounding in his head.  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  Saw his PCP previously for similar issues and has follow-up with her next week.  He was told he had a calcaneal spur and that it might need to be shaved off and that he had an abnormality in the ball of his left foot that might need surgery.  He does not yet have a referral.   PERTINENT  PMH / PSH: I have reviewed the patient's medications, allergies, past medical and surgical history, smoking status and updated in the EMR as appropriate. Sickle cell trait, hypertension  OBJECTIVE:  BP 138/78   Pulse 80   Wt 156 lb 12.8 oz (71.1 kg)   SpO2 98%   BMI 24.56 kg/m  GENERAL: Well-developed male no acute distress EXTREMITY: Right lower extremity Achilles tendon area is mildly tender to palpation at the origin particularly medially.  The tendon itself is intact without defect.  He has full strength in plantarflexion.  Negative Homans' sign .  There is slight tenderness to palpation at the origin of the plantar fascia.   Left lower extremity: Mildly tender to palpation over the midfoot medially.  ASSESSMENT / PLAN:  Mild calcaneal spur right foot seen on x-ray.  He only has very mild tenderness at the origin of the plantar fascia.  Do not think the calcaneal spur needs surgical intervention.  If he continues to have problems here, I would refer him to sports medicine for further evaluation of [place] fasciitis. Achilles tendinitis of right lower extremity Mild Achilles tendinitis.  He has been using OTC NSAIDs without much help.  We will start him on topical diclofenac 4 times daily.  Follow-up with  his PCP is already arranged for next week.  If not improving, could consider sports medicine referral for gait analysis and possible orthotics.  Alternatively could try nitroglycerin therapy.  Avascular necrosis of bone of foot ([place]) Reviewed x-ray with him.  I do think he needs to be seen by foot specialist.  Could consider Dr. Lucia [facility]ins at [facility] as this will probably be a very complex surgery and he is orthopedic fellowship trained  foot specialist.   Dorcas Mcmurray MD

## 2021-11-11 NOTE — Assessment & Plan Note (Signed)
Mild Achilles tendinitis.  He has been using OTC NSAIDs without much help.  We will start him on topical diclofenac 4 times daily.  Follow-up with his PCP is already arranged for next week.  If not improving, could consider sports medicine referral for gait analysis and possible orthotics.  Alternatively could try nitroglycerin therapy.

## 2021-11-11 NOTE — Assessment & Plan Note (Signed)
Reviewed x-ray with him.  I do think he needs to be seen by foot specialist.  Could consider Dr. Lucia Gaskins at [facility] as this will probably be a very complex surgery and he is orthopedic fellowship trained  foot specialist.

## 2021-11-11 NOTE — Patient Instructions (Signed)
[place] the topical cream 4 times a day for your right heel as we discussed. Great to meet you!

## 2021-11-17 ENCOUNTER — Encounter: Payer: Self-pay | Admitting: Family Medicine

## 2021-11-17 ENCOUNTER — Ambulatory Visit (INDEPENDENT_AMBULATORY_CARE_PROVIDER_SITE_OTHER): Payer: Managed Care, Other (non HMO) | Admitting: Family Medicine

## 2021-11-17 ENCOUNTER — Other Ambulatory Visit: Payer: Self-pay | Admitting: Family Medicine

## 2021-11-17 VITALS — BP 135/91 | HR 75 | Ht 67.0 in | Wt 156.4 lb

## 2021-11-17 DIAGNOSIS — A530 Latent syphilis, unspecified as early or late: Secondary | ICD-10-CM

## 2021-11-17 DIAGNOSIS — A539 Syphilis, unspecified: Secondary | ICD-10-CM

## 2021-11-17 DIAGNOSIS — R4189 Other symptoms and signs involving cognitive functions and awareness: Secondary | ICD-10-CM | POA: Diagnosis not present

## 2021-11-17 DIAGNOSIS — I10 Essential (primary) hypertension: Secondary | ICD-10-CM

## 2021-11-17 DIAGNOSIS — F430 Acute stress reaction: Secondary | ICD-10-CM

## 2021-11-17 DIAGNOSIS — M87076 Idiopathic aseptic necrosis of unspecified foot: Secondary | ICD-10-CM | POA: Diagnosis not present

## 2021-11-17 DIAGNOSIS — R748 Abnormal levels of other serum enzymes: Secondary | ICD-10-CM

## 2021-11-17 MED ORDER — PENICILLIN G BENZATHINE 1200000 UNIT/2ML IM SUSY
1.2000 10*6.[IU] | PREFILLED_SYRINGE | Freq: Once | INTRAMUSCULAR | Status: AC
  Administered 2021-11-17: 1.2 10*6.[IU] via INTRAMUSCULAR

## 2021-11-17 NOTE — Addendum Note (Signed)
Addended by: Andrena Mews T on: 11/17/2021 05:23 PM   Modules accepted: Orders

## 2021-11-17 NOTE — Assessment & Plan Note (Signed)
Upon record review, he had been contacted 3 times by [facility] to schedule an appointment. I helped him schedule his appointment during this visit. Tylenol as needed for pain.

## 2021-11-17 NOTE — Assessment & Plan Note (Signed)
He will complete his treatment today. I advised repeat RPR in 6 months, 12 months and 24 months. He requested a repeat test in the next few weeks due to his anxiety. He will schedule a lab appointment. RPR ordered.

## 2021-11-17 NOTE — Assessment & Plan Note (Signed)
Health anxiety: Counseling and reassurance provided. I discussed FMLA request from his human resources to allow time for doctors appointment. He agreed with this.

## 2021-11-17 NOTE — Assessment & Plan Note (Signed)
??   Related to syphilis vs dementia onset. He completes treatment for his syphilis today. We agreed to proceed with MRI of the brain to assess his dementia. We will discuss dementia medication vs neuro referral once we have his MRI result. He agreed with the plan.

## 2021-11-17 NOTE — Assessment & Plan Note (Signed)
BP improved. Monitor on current regimen for now.

## 2021-11-17 NOTE — Progress Notes (Signed)
    SUBJECTIVE:   CHIEF COMPLAINT / HPI:   HTN: He is compliant with his Lisinopril 10 mg QD. He is here for f/u. No concerns.   Concentration/Memory loss: Gradually worsening. He would like to be further evaluated for this.   Osteonecrosis: Ankle and toe pain persists. He stated that he had not heard back from an orthopedic referral to schedule an appointment.   Anxious: He feels his health is falling apart and is worried about his ability to provide for his family in the future. He seems to be attending more doctor's appointments, which is impacting his work as well, as he has limited ability to take time off work for his healthcare.  Syphilis:  Last dose of Penicillin today. He is uncertain how he got this infection as he had been with the same partner for many years with no extramarital relationship. His partner tested negative, which makes it more concerning for him.   PERTINENT  PMH / PSH: PMHX reviewed  OBJECTIVE:   Vitals:   11/17/21 1112 11/17/21 1136  BP: 130/87 (!) 135/91  Pulse: 75   SpO2: 99%   Weight: 156 lb 6 oz (70.9 kg)   Height: '5\' 7"'$  (1.702 m)     Physical Exam Vitals and nursing note reviewed.  Cardiovascular:     Rate and Rhythm: Normal rate and regular rhythm.     Pulses: Normal pulses.     Heart sounds: Normal heart sounds. No murmur heard. Pulmonary:     Effort: Pulmonary effort is normal. No respiratory distress.     Breath sounds: Normal breath sounds. No wheezing.  Neurological:     General: No focal deficit present.     Mental Status: He is oriented to person, place, and time.  Psychiatric:        Mood and Affect: Mood normal.        Behavior: Behavior normal.        Thought Content: Thought content does not include homicidal or suicidal ideation.     Comments: [place] Office Visit from 11/17/2021 in [place]  PHQ-9 Total Score 13          ASSESSMENT/PLAN:   Essential hypertension, benign BP  improved. Monitor on current regimen for now.   Cognitive impairment ?? Related to syphilis vs dementia onset. He completes treatment for his syphilis today. We agreed to proceed with MRI of the brain to assess his dementia. We will discuss dementia medication vs neuro referral once we have his MRI result. He agreed with the plan.    Avascular necrosis of bone of foot ([place]) Upon record review, he had been contacted 3 times by [facility] to schedule an appointment. I helped him schedule his appointment during this visit. Tylenol as needed for pain.   Syphilis, latent He will complete his treatment today. I advised repeat RPR in 6 months, 12 months and 24 months. He requested a repeat test in the next few weeks due to his anxiety. He will schedule a lab appointment. RPR ordered.   Acute stress reaction Health anxiety: Counseling and reassurance provided. I discussed FMLA request from his human resources to allow time for doctors appointment. He agreed with this.       Andrena Mews, MD [place]

## 2021-11-17 NOTE — Patient Instructions (Addendum)
  [facility]. Address: [address] [place], [place] [address] Phone: [phone]  Referral  [facility] Address: [place], [place], [place] [address] Phone: [phone]

## 2021-11-19 ENCOUNTER — Telehealth: Payer: Self-pay

## 2021-11-19 NOTE — Telephone Encounter (Signed)
Spoke with [place] at [place].To make appt for MRI, but she said since the patient was not in the office. She would have to call them to make appt. Salvatore Marvel, CMA

## 2021-11-19 NOTE — Telephone Encounter (Signed)
-----   Message from Kinnie Feil, MD sent at 11/17/2021  5:19 PM EDT ----- Please contact patient to help him schedule an MRI appointment. Thanks.

## 2021-11-21 ENCOUNTER — Other Ambulatory Visit: Payer: Self-pay | Admitting: Family Medicine

## 2021-11-24 ENCOUNTER — Ambulatory Visit (INDEPENDENT_AMBULATORY_CARE_PROVIDER_SITE_OTHER): Payer: Managed Care, Other (non HMO) | Admitting: Podiatry

## 2021-11-24 DIAGNOSIS — M7661 Achilles tendinitis, right leg: Secondary | ICD-10-CM

## 2021-11-24 DIAGNOSIS — M8700 Idiopathic aseptic necrosis of unspecified bone: Secondary | ICD-10-CM

## 2021-11-24 DIAGNOSIS — M722 Plantar fascial fibromatosis: Secondary | ICD-10-CM | POA: Diagnosis not present

## 2021-11-24 NOTE — Progress Notes (Unsigned)
Subjective:   Patient ID: Joseph Tyler, male   DOB: 61 y.o.   MRN: [id number]   HPI Chief Complaint  Patient presents with   Foot Pain    Right foot heel, started 2 years ago, left top of the foot and medial side of the foot pain, started 4 years ago, rate of pain 10 out of 10, some swelling in the left foot, sharp shooting pain, X-Rays already taken by PCP on 11-02-2021    Right heel and left medial fot. On the left it has been hurting for about 6 years and last few years on the right. He has tried "pain killers". Right now he is on ibuprofen. No recent injuries. No recent steroid use. He was steroid injects in his back and knee but not in the foot.   Right heel posterior heel and platnar  He has sickle cell trait.    ROS      Objective:  Physical Exam  ***     Assessment:  ***     Plan:  ***    -MRI b/l ankles -Powersteps -Stretch -Ice -NS right

## 2021-12-15 ENCOUNTER — Other Ambulatory Visit: Payer: Managed Care, Other (non HMO)

## 2021-12-15 ENCOUNTER — Ambulatory Visit
Admission: RE | Admit: 2021-12-15 | Discharge: 2021-12-15 | Disposition: A | Discharge status: Home / Self Care | Payer: Managed Care, Other (non HMO) | Source: Ambulatory Visit | Attending: Family Medicine | Admitting: Family Medicine

## 2021-12-15 DIAGNOSIS — R4189 Other symptoms and signs involving cognitive functions and awareness: Secondary | ICD-10-CM

## 2021-12-15 DIAGNOSIS — R748 Abnormal levels of other serum enzymes: Secondary | ICD-10-CM

## 2021-12-15 DIAGNOSIS — A530 Latent syphilis, unspecified as early or late: Secondary | ICD-10-CM

## 2021-12-15 MED ORDER — GADOPICLENOL 0.5 MMOL/ML IV SOLN
7.5000 mL | Freq: Once | INTRAVENOUS | Status: AC | PRN
  Administered 2021-12-15: 7.5 mL via INTRAVENOUS

## 2021-12-16 ENCOUNTER — Telehealth: Payer: Self-pay | Admitting: Family Medicine

## 2021-12-16 LAB — CMP14+EGFR
ALT: 10 IU/L (ref 0–44)
AST: 15 IU/L (ref 0–40)
Albumin/Globulin Ratio: 1.4 (ref 1.2–2.2)
Albumin: 4.1 g/dL (ref 3.9–4.9)
Alkaline Phosphatase: 130 IU/L — ABNORMAL HIGH (ref 44–121)
BUN/Creatinine Ratio: 9 — ABNORMAL LOW (ref 10–24)
BUN: 9 mg/dL (ref 8–27)
Bilirubin Total: 0.8 mg/dL (ref 0.0–1.2)
CO2: 20 mmol/L (ref 20–29)
Calcium: 9.4 mg/dL (ref 8.6–10.2)
Chloride: 106 mmol/L (ref 96–106)
Creatinine, Ser: 0.99 mg/dL (ref 0.76–1.27)
Globulin, Total: 2.9 g/dL (ref 1.5–4.5)
Glucose: 86 mg/dL (ref 70–99)
Potassium: 4.3 mmol/L (ref 3.5–5.2)
Sodium: 143 mmol/L (ref 134–144)
Total Protein: 7 g/dL (ref 6.0–8.5)
eGFR: 87 mL/min/{1.73_m2} (ref >59)

## 2021-12-16 LAB — RPR W/REFLEX TO TREPSURE: RPR: REACTIVE — AB

## 2021-12-16 LAB — HEPATITIS B SURFACE ANTIBODY,QUALITATIVE: Hep B Surface Ab, Qual: REACTIVE

## 2021-12-16 LAB — RPR, QUANT: RPR, Quant: 1:2 {titer} — ABNORMAL HIGH

## 2021-12-16 NOTE — Telephone Encounter (Signed)
Unable to leave a telephone message.  When he calls, please, advise him to follow up with me or any available provider to discuss MRI brain report and management. Thanks.       MR Brain W Wo Contrast  Result Date: 12/16/2021 CLINICAL DATA:  Memory loss EXAM: MRI HEAD WITHOUT AND WITH CONTRAST TECHNIQUE: Multiplanar, multiecho pulse sequences of the brain and surrounding structures were obtained without and with intravenous contrast. CONTRAST:  7.5 ccVueway COMPARISON:  Head CT 05/25/2019 FINDINGS: Brain: Mild generalized brain volume loss without subjective lobar predominance. Diffusion imaging does not show any acute or subacute infarction. No abnormality seen affecting the brainstem or cerebellum. Cerebral hemispheres show moderate changes of chronic small vessel disease affecting the white matter. No cortical or large vessel territory infarction. No mass lesion, hemorrhage, hydrocephalus or extra-axial collection. After contrast administration, no abnormal enhancement occurs. Vascular: Major vessels at the base of the brain show flow. Skull and upper cervical spine: Negative Sinuses/Orbits: Clear/normal Other: None IMPRESSION: No acute or reversible finding. Generalized brain volume loss without subjective lobar predominance. Moderate chronic small-vessel ischemic changes of the cerebral hemispheric white matter. Electronically Signed   By: Nelson Chimes M.D.   On: 12/16/2021 15:54

## 2021-12-16 NOTE — Telephone Encounter (Signed)
I discussed test result with him. RPR and titer remains same as expected. I had initially counseled him to wait 6 months post treatment prior to retesting, but he could not wait. At this point, we will wait another 4-5 months to repeat test. He agreed with the plan. He asked if he could still transmit infection to his partner. My guess would be no,since he has been treated. However, I advised him, I am not 955% certain, hence will still be cautious till his titer goes down. He agreed with the plan.

## 2021-12-17 NOTE — Telephone Encounter (Signed)
Scheduled pt with you on 01/26/2022 '@9'$ :10 a.m pt did not want to see anyone else and stated that he will just wait. Informed pt that you were out but pt asked if you could call him to talk about another issue that he has.

## 2021-12-17 NOTE — Telephone Encounter (Signed)
Tried to schedule patient for an appointment with you. Your first day available is January 9th. Pt said he can not wait that long, I offered to schedule him with someone else and pt was not interested and wanted to speak with you. He asked if you can call him by phone instead to discuss results.

## 2021-12-18 ENCOUNTER — Encounter: Payer: Self-pay | Admitting: Podiatry

## 2021-12-22 ENCOUNTER — Ambulatory Visit
Admission: RE | Admit: 2021-12-22 | Discharge: 2021-12-22 | Disposition: A | Discharge status: Home / Self Care | Payer: Managed Care, Other (non HMO) | Source: Ambulatory Visit | Attending: Podiatry

## 2021-12-22 ENCOUNTER — Other Ambulatory Visit: Payer: Managed Care, Other (non HMO)

## 2021-12-22 DIAGNOSIS — M8700 Idiopathic aseptic necrosis of unspecified bone: Secondary | ICD-10-CM

## 2022-01-26 ENCOUNTER — Encounter: Payer: Self-pay | Admitting: Family Medicine

## 2022-01-26 ENCOUNTER — Ambulatory Visit (INDEPENDENT_AMBULATORY_CARE_PROVIDER_SITE_OTHER): Payer: Managed Care, Other (non HMO) | Admitting: Family Medicine

## 2022-01-26 VITALS — BP 141/86 | HR 74 | Ht 67.0 in | Wt 156.2 lb

## 2022-01-26 DIAGNOSIS — F01B18 Vascular dementia, moderate, with other behavioral disturbance: Secondary | ICD-10-CM | POA: Diagnosis not present

## 2022-01-26 DIAGNOSIS — M87076 Idiopathic aseptic necrosis of unspecified foot: Secondary | ICD-10-CM

## 2022-01-26 DIAGNOSIS — M87875 Other osteonecrosis, left foot: Secondary | ICD-10-CM

## 2022-01-26 DIAGNOSIS — I10 Essential (primary) hypertension: Secondary | ICD-10-CM

## 2022-01-26 DIAGNOSIS — F0150 Vascular dementia without behavioral disturbance: Secondary | ICD-10-CM | POA: Insufficient documentation

## 2022-01-26 MED ORDER — ROSUVASTATIN CALCIUM 5 MG PO TABS: 5.0000 mg | ORAL_TABLET | Freq: Every day | ORAL | 3 refills | Status: DC

## 2022-01-26 NOTE — Patient Instructions (Signed)
Vascular Dementia Dementia is a condition in which a person has problems with thinking, memory, and behavior that are severe enough to interfere with daily life. Vascular dementia is a type of dementia. It results from brain damage that is caused by the brain not getting enough blood. This condition may also be called vascular cognitive impairment. What are the causes? Vascular dementia is caused by conditions that reduce blood flow to the brain. Common causes of this condition include: Multiple small strokes. These may happen without symptoms (silent stroke). Major stroke. Damage to small blood vessels in the brain (cerebral small vessel disease). What increases the risk? The following factors may make you more likely to develop this condition: Having had a stroke. Having high blood pressure (hypertension) or high cholesterol. Having a disease that affects the heart or blood vessels. Smoking. Not being active. Being over age 11. Having any of these conditions: Diabetes. Metabolic syndrome. Obesity. Depression. A genetic condition that leads to stroke, such as CADASIL (cerebral autosomal dominant arteriopathy with subcortical infarcts and leukoencephalopathy). What are the signs or symptoms? Symptoms of vascular dementia can vary from one person to another. Symptoms may be mild or severe depending on the amount of damage and which parts of the brain have been affected. Symptoms may begin suddenly or may develop slowly. Mental symptoms may include: Confusion and memory problems. Poor attention and concentration. Trouble understanding speech. Depression. Personality changes. Trouble recognizing familiar people. Agitation or aggression. Paranoia or false beliefs (delusions). Hallucinations. These involve seeing, hearing, tasting, smelling, or feeling things that are not real. Physical symptoms may include: Weakness. Poor balance. Loss of bladder or bowel control  (incontinence). Unsteady walking (gait). Speaking problems. Behavioral symptoms may include: Getting lost in familiar places. Problems with planning and judgment. Trouble following instructions. Social problems. Emotional outbursts. Trouble with daily activities and self-care. Problems handling money. Symptoms may remain stable, or they may get worse over time. Symptoms of vascular dementia may be similar to those of Alzheimer's disease. The two conditions can occur together (mixed dementia). How is this diagnosed? This condition may be diagnosed based on: Your medical history and a physical exam. Symptoms or changes that are reported by friends and family. Lab tests or other tests that check brain and nervous system function. Tests may include: Blood tests. Brain imaging tests. Tests of movement, speech, and other daily activities (neurological exam). Tests of memory, thinking, and problem-solving (neuropsychological or neurocognitive testing). There is not a specific test to diagnose vascular dementia. Diagnosis may involve several specialists. These may include: A health care provider who specializes in the brain and nervous system (neurologist). A health care provider who specializes in understanding how problems in the brain can alter behavior and cognitive function (neuropsychologist). How is this treated? There is no cure for vascular dementia. Brain damage that has already occurred cannot be reversed. Treatment depends on: How severe the condition is. Which parts of your brain have been affected. Your overall health. Treatment measures aim to: Treat the underlying cause of vascular dementia and manage risk factors. This may include: Controlling blood pressure or lowering cholesterol. Treating diabetes. Making lifestyle changes, such as quitting smoking or losing weight. Manage symptoms. Prevent further brain damage. Improve the person's health and quality of  life. Treatment for dementia may involve a team of health care providers, including: A neurologist. A provider who specializes in disorders of the mind (psychiatrist). A provider who specializes in helping people learn daily living skills (occupational therapist). A provider who focuses  on speech and language changes (Electrical engineer). A heart specialist (cardiologist). A provider who helps people learn how to manage physical changes, such as movement and walking (exercise physiologist or physical therapist). Follow these instructions at home: Medicines Take over-the-counter and prescription medicines only as told by your health care provider. Use a pill organizer or pill reminder to help you manage your medicines. Lifestyle Do not use any products that contain nicotine or tobacco, such as cigarettes, e-cigarettes, and chewing tobacco. If you need help quitting, ask your health care provider. Eat a healthy, balanced diet. Maintain a healthy weight, or lose weight if needed. Be physically active as told by your health care provider. General instructions Work with your health care provider to determine what you need help with and what your safety needs are. Follow the health care provider's instructions for treating the condition that caused the dementia. Keep all follow-up visits. This is important. If you are the caregiver:  People with vascular dementia may need regular help at home or daily care from a family member or home health care worker. Home care for a person with vascular dementia depends on what caused the condition and how severe the symptoms are. General guidelines for caregivers include: Help the person with dementia remember people, appointments, and daily activities. Help the person with dementia manage his or her medicines. Help family and friends learn about ways to communicate with the person with dementia. Create a safe living space to reduce the risk of injury or  falls. Find a support group to help caregivers and family cope with the effects of dementia.  Where to find more information [employer] of Neurological Disorders and Stroke: MasterBoxes.it Contact a health care provider if: New behavioral problems develop. Problems with swallowing develop. Confusion gets worse. Sleepiness gets worse. Get help right away if: Loss of consciousness occurs. There is a sudden loss of speech, balance, or thinking ability. New numbness or paralysis occurs. Sudden, severe headache occurs. Vision is lost or suddenly gets worse in one or both eyes. If you ever feel like the person with dementia may hurt himself or herself or others, or if he or she shares thoughts about taking his or her own life, get help right away. You can go to your nearest emergency department or: Call your local emergency services (911 in the [place] Call a suicide crisis helpline, such as the [place] at 863-461-5768 or 988 in the [place]. This is open 24 hours a day in the [place] Text the Crisis Text Line at (754)538-9907 (in the [place].). Summary Vascular dementia is a type of dementia. It results from brain damage that is caused by the brain not getting enough blood. Vascular dementia is caused by conditions that reduce blood flow to the brain. Common causes of this condition include stroke and damage to small blood vessels in the brain. Treatment focuses on treating the underlying cause of vascular dementia and managing any risk factors. People with vascular dementia may need regular help at home or daily care from a family member or home health care worker. Contact a health care provider if you or your caregiver notices any new symptoms. This information is not intended to replace advice given to you by your health care provider. Make sure you discuss any questions you have with your health care provider. Document Revised: 07/30/2020 Document Reviewed:  05/21/2019 Elsevier Patient Education  [place].

## 2022-01-26 NOTE — Assessment & Plan Note (Signed)
Very minimally above goal. Monitor for now.

## 2022-01-26 NOTE — Assessment & Plan Note (Signed)
He mentioned need to be referred to a different podiatrist for this issue at the end of the visit. He wishes to go with Dr. Verlon Au recommendation during his last visit with her (see below).  Reviewed x-ray with him.  I do think he needs to be seen by foot specialist.  Could consider Dr. Lucia Gaskins at [facility] as this will probably be a very complex surgery and he is orthopedic fellowship trained  foot specialist.

## 2022-01-26 NOTE — Progress Notes (Addendum)
    SUBJECTIVE:   CHIEF COMPLAINT / HPI:   Memory loss: Here to discuss MRI report and treatment options for his memory loss.  Here lately, he has been having some difficulty with driving. Sometimes, he forgets his turnings or stops for the red light prematurely. Both the patient and his wife have noticed some personality changes. He gets angry or aggravated by minor issues. He is worried about how this might impact his work and driving.   HTN: Compliant with BP med.  HM:  Yet to obtain his cologard stool sample. He has the collection box at home.   PERTINENT  PMH / PSH: PMHx reviewed  OBJECTIVE:   BP (!) 141/86   Pulse 74   Ht '5\' 7"'$  (1.702 m)   Wt 156 lb 3.2 oz (70.9 kg)   SpO2 100%   BMI 24.46 kg/m   Physical Exam Vitals reviewed.  Cardiovascular:     Rate and Rhythm: Normal rate and regular rhythm.     Heart sounds: Normal heart sounds. No murmur heard. Pulmonary:     Effort: Pulmonary effort is normal. No respiratory distress.     Breath sounds: Normal breath sounds. No wheezing.  Abdominal:     General: Abdomen is flat. Bowel sounds are normal. There is no distension.     Palpations: Abdomen is soft. There is no mass.     Tenderness: There is no abdominal tenderness.  Musculoskeletal:     Cervical back: Normal range of motion and neck supple.  Neurological:     Mental Status: He is oriented to person, place, and time.     Gait: Gait normal.      ASSESSMENT/PLAN:   Vascular dementia ([place]) I discussed various preventative measures - exercise, BP control, healthy diet, brain activities, etc. I discussed indications for ASA use. Based on the literature review, this is not recommended for primary prevention. I will readdress in the future. FLP discussed. Intermediate ASCVD risk: I called him after the visit to discuss Statin use, and he agreed with this. I provided information regarding Aricept and Namendia, which may slow down disease progress. A future  appointment is scheduled to discuss interest in starting Namendia or Aricept. Both the patient and his wife are interested in referral to a psychiatrist for his personality changes.  Referral placed.  We also discussed driving safety.  Given his gradually decline, I recommended stopping driving for now for his safety and that of the community. He verbalized understanding.  Essential hypertension, benign Very minimally above goal. Monitor for now.   Avascular necrosis of bone of foot ([place]) He mentioned need to be referred to a different podiatrist for this issue at the end of the visit. He wishes to go with Dr. Verlon Au recommendation during his last visit with her (see below).  Reviewed x-ray with him.  I do think he needs to be seen by foot specialist.  Could consider Dr. Lucia Gaskins at [facility] as this will probably be a very complex surgery and he is orthopedic fellowship trained  foot specialist.       Andrena Mews, MD [place]

## 2022-01-26 NOTE — Assessment & Plan Note (Addendum)
I discussed various preventative measures - exercise, BP control, healthy diet, brain activities, etc. I discussed indications for ASA use. Based on the literature review, this is not recommended for primary prevention. I will readdress in the future. FLP discussed. Intermediate ASCVD risk: I called him after the visit to discuss Statin use, and he agreed with this. I provided information regarding Aricept and Namendia, which may slow down disease progress. A future appointment is scheduled to discuss interest in starting Namendia or Aricept. Both the patient and his wife are interested in referral to a psychiatrist for his personality changes.  Referral placed.  We also discussed driving safety.  Given his gradually decline, I recommended stopping driving for now for his safety and that of the community. He verbalized understanding.

## 2022-01-27 ENCOUNTER — Telehealth: Payer: Self-pay | Admitting: Podiatry

## 2022-01-27 NOTE — Telephone Encounter (Signed)
Left message for pt to call to schedule an appt to discuss mri results

## 2022-02-04 ENCOUNTER — Telehealth: Payer: Self-pay | Admitting: Podiatry

## 2022-02-04 NOTE — Telephone Encounter (Signed)
Left message for pt to call to schedule an appt with Dr Jacqualyn Posey to discuss mri results.

## 2022-02-04 NOTE — Telephone Encounter (Signed)
-----  Message from Trula Slade, DPM sent at 01/26/2022  2:44 PM EST ----- Can you call him to schedule follow up? Thanks!

## 2022-02-23 ENCOUNTER — Ambulatory Visit: Payer: Managed Care, Other (non HMO) | Admitting: Family Medicine

## 2022-03-02 ENCOUNTER — Encounter: Payer: Self-pay | Admitting: Podiatry

## 2022-03-02 ENCOUNTER — Ambulatory Visit (INDEPENDENT_AMBULATORY_CARE_PROVIDER_SITE_OTHER): Payer: Managed Care, Other (non HMO) | Admitting: Podiatry

## 2022-03-02 VITALS — BP 156/91 | HR 60

## 2022-03-02 DIAGNOSIS — M7661 Achilles tendinitis, right leg: Secondary | ICD-10-CM

## 2022-03-02 DIAGNOSIS — M722 Plantar fascial fibromatosis: Secondary | ICD-10-CM | POA: Diagnosis not present

## 2022-03-02 DIAGNOSIS — R2242 Localized swelling, mass and lump, left lower limb: Secondary | ICD-10-CM

## 2022-03-02 DIAGNOSIS — M8700 Idiopathic aseptic necrosis of unspecified bone: Secondary | ICD-10-CM

## 2022-03-02 DIAGNOSIS — M79671 Pain in right foot: Secondary | ICD-10-CM

## 2022-03-02 DIAGNOSIS — G8929 Other chronic pain: Secondary | ICD-10-CM

## 2022-03-02 NOTE — Progress Notes (Signed)
Subjective: Chief Complaint  Patient presents with   Foot Pain    "I did my MRI, I'm here to see what's going on."   62 year old male presents the office with above concerns.  He states that he is doing about the same on the left side but the right heel is hurting more and unfortunately right MRI was not approved originally he is asking to have this done.  On the left side he presents to discuss MRI results.  He has no other concerns today.  No recent injury or changes otherwise.  Objective: AAO x3, NAD DP/PT pulses palpable bilaterally, CRT less than 3 seconds There is continuation tenderness on the medial aspect of the foot along the medial cuneiform.  There is trace edema there is no erythema or warmth there is no open lesions.  Flexor, extensor tendons are intact.  On the right side there is tenderness palpation to the posterior aspect of the heel and insertion of Achilles tendon as well as along the plantar fascia.  There is no pain with lateral compression of the calcaneus.  Flexor, extensor tendons appear to be intact.  MMT 5/5. No pain with calf compression, swelling, warmth, erythema  Assessment: Left foot avascular necrosis, right chronic heel pain  Plan: -All treatment options discussed with the patient including all alternatives, risks, complications.  -I reviewed the MRI with him.  Discussed likely avascular necrosis.  I did discuss the case with the group after the MRI was completed as well we discussed bone biopsy.  Discussed this with him and he wants to hold off.  We discussed surgical versus conservative treatment.  He is not interested in surgery at this time.  Will start with blood work which I ordered today including sed rate, CRP, uric acid, CBC.  Dispensed a graphite insert.  We discussed at length etiology of avascular necrosis. -Given ongoing nature of his right heel pain we will order an MRI.  Trula Slade DPM

## 2022-03-03 LAB — CBC WITH DIFFERENTIAL/PLATELET
Basophils Absolute: 0 10*3/uL (ref 0.0–0.2)
Basos: 1 %
EOS (ABSOLUTE): 0.2 10*3/uL (ref 0.0–0.4)
Eos: 3 %
Hematocrit: 39.7 % (ref 37.5–51.0)
Hemoglobin: 13.3 g/dL (ref 13.0–17.7)
Immature Grans (Abs): 0 10*3/uL (ref 0.0–0.1)
Immature Granulocytes: 0 %
Lymphocytes Absolute: 1.1 10*3/uL (ref 0.7–3.1)
Lymphs: 20 %
MCH: 28 pg (ref 26.6–33.0)
MCHC: 33.5 g/dL (ref 31.5–35.7)
MCV: 84 fL (ref 79–97)
Monocytes Absolute: 0.5 10*3/uL (ref 0.1–0.9)
Monocytes: 10 %
Neutrophils Absolute: 3.5 10*3/uL (ref 1.4–7.0)
Neutrophils: 66 %
Platelets: 192 10*3/uL (ref 150–450)
RBC: 4.75 x10E6/uL (ref 4.14–5.80)
RDW: 14 % (ref 11.6–15.4)
WBC: 5.3 10*3/uL (ref 3.4–10.8)

## 2022-03-03 LAB — C-REACTIVE PROTEIN: CRP: 3 mg/L (ref 0–10)

## 2022-03-03 LAB — URIC ACID: Uric Acid: 6 mg/dL (ref 3.8–8.4)

## 2022-03-03 LAB — SEDIMENTATION RATE: Sed Rate: 17 mm/hr (ref 0–30)

## 2022-03-09 ENCOUNTER — Encounter: Payer: Self-pay | Admitting: Family Medicine

## 2022-03-09 ENCOUNTER — Ambulatory Visit (INDEPENDENT_AMBULATORY_CARE_PROVIDER_SITE_OTHER): Payer: Managed Care, Other (non HMO) | Admitting: Family Medicine

## 2022-03-09 VITALS — BP 130/85 | HR 65 | Ht 67.0 in | Wt 156.6 lb

## 2022-03-09 DIAGNOSIS — R339 Retention of urine, unspecified: Secondary | ICD-10-CM | POA: Diagnosis not present

## 2022-03-09 DIAGNOSIS — Z1211 Encounter for screening for malignant neoplasm of colon: Secondary | ICD-10-CM

## 2022-03-09 DIAGNOSIS — F01B18 Vascular dementia, moderate, with other behavioral disturbance: Secondary | ICD-10-CM

## 2022-03-09 DIAGNOSIS — N401 Enlarged prostate with lower urinary tract symptoms: Secondary | ICD-10-CM

## 2022-03-09 DIAGNOSIS — H903 Sensorineural hearing loss, bilateral: Secondary | ICD-10-CM | POA: Diagnosis not present

## 2022-03-09 MED ORDER — TAMSULOSIN HCL 0.4 MG PO CAPS: 0.8000 mg | ORAL_CAPSULE | Freq: Every day | ORAL | 1 refills | Status: DC

## 2022-03-09 NOTE — Assessment & Plan Note (Signed)
He declined urology referral. PSA checked. Flomax increased. F/U soon if no improvement. Consider Urology referral at the time.

## 2022-03-09 NOTE — Patient Instructions (Signed)

## 2022-03-09 NOTE — Assessment & Plan Note (Signed)
Stable. I discussed that + syphilis might also contribute to memory loss and discussed LP to assess CSI for this. He declined yet again. Monitor closely for now. Repeat RPR scheduled in April.

## 2022-03-09 NOTE — Progress Notes (Signed)
    SUBJECTIVE:   CHIEF COMPLAINT / HPI:   Memory loss: He does not want to be started on medications. He feels he is doing well currently at work, and his memory has not worsened over the past months.  Incomplete bladder emptying: Very mild improvement with Flomax. He denies dysuria or a change in urine color. He wants to get his PSA checked.  Hearing loss: He had f/u with ENT and recommended a hearing aid. No new concerns.   HM: He requested a referral to GI as he does not wish to complete cologard screening.  PERTINENT  PMH / PSH: PMHx reviewed  OBJECTIVE:   BP 130/85   Pulse 65   Ht 5' 7"$  (1.702 m)   Wt 156 lb 9.6 oz (71 kg)   SpO2 100%   BMI 24.53 kg/m   Physical Exam Vitals and nursing note reviewed.  Cardiovascular:     Rate and Rhythm: Normal rate and regular rhythm.     Heart sounds: Normal heart sounds. No murmur heard. Pulmonary:     Effort: Pulmonary effort is normal. No respiratory distress.     Breath sounds: Normal breath sounds. No wheezing.  Abdominal:     General: Bowel sounds are normal. There is no distension.      ASSESSMENT/PLAN:   Vascular dementia ([place]) Stable. I discussed that + syphilis might also contribute to memory loss and discussed LP to assess CSI for this. He declined yet again. Monitor closely for now. Repeat RPR scheduled in April.   Incomplete bladder emptying He declined urology referral. PSA checked. Flomax increased. F/U soon if no improvement. Consider Urology referral at the time.   Sensorineural hearing loss Asymmetrical, worse on the left. ENT report from 02/22/22 reviewed. Hearing aid recommended. F/U with ENT as planned.    Referred to GI for colon cancer screening.   Andrena Mews, MD [place]

## 2022-03-09 NOTE — Assessment & Plan Note (Signed)
Asymmetrical, worse on the left. ENT report from 02/22/22 reviewed. Hearing aid recommended. F/U with ENT as planned.

## 2022-03-10 ENCOUNTER — Telehealth: Payer: Self-pay | Admitting: Family Medicine

## 2022-03-10 LAB — PSA, TOTAL AND FREE
PSA, Free Pct: 25.6 %
PSA, Free: 0.69 ng/mL
Prostate Specific Ag, Serum: 2.7 ng/mL (ref 0.0–4.0)

## 2022-03-10 MED ORDER — IBUPROFEN 400 MG PO TABS: 400.0000 mg | ORAL_TABLET | Freq: Two times a day (BID) | ORAL | 0 refills | Status: DC | PRN

## 2022-03-10 NOTE — Telephone Encounter (Addendum)
Normal PSA discussed. He brought up the issue of his heel pain which we discussed briefly during his visit yesterday. He is having more pain today and wonder if he can get work note to be off work few a few days. This is reasonable. However, he stated that he will go to work today to see how he does and contact our office soon regarding this.  May use Ibuprofen prn pain. Med refilled.   Ok to provide letter for a few days off work to get weigh off his foot.  Diagnosis: Severe left foot osteonecrosis/Avascular necrosis

## 2022-03-10 NOTE — Addendum Note (Signed)
Addended by: Andrena Mews T on: 03/10/2022 11:37 AM   Modules accepted: Orders

## 2022-03-15 ENCOUNTER — Encounter: Payer: Self-pay | Admitting: Podiatry

## 2022-03-23 ENCOUNTER — Ambulatory Visit
Admission: RE | Admit: 2022-03-23 | Discharge: 2022-03-23 | Disposition: A | Discharge status: Home / Self Care | Payer: Managed Care, Other (non HMO) | Source: Ambulatory Visit | Attending: Podiatry

## 2022-03-23 DIAGNOSIS — G8929 Other chronic pain: Secondary | ICD-10-CM

## 2022-03-23 DIAGNOSIS — M722 Plantar fascial fibromatosis: Secondary | ICD-10-CM

## 2022-03-23 DIAGNOSIS — M7661 Achilles tendinitis, right leg: Secondary | ICD-10-CM

## 2022-03-30 ENCOUNTER — Ambulatory Visit (INDEPENDENT_AMBULATORY_CARE_PROVIDER_SITE_OTHER): Payer: Managed Care, Other (non HMO) | Admitting: Podiatry

## 2022-03-30 ENCOUNTER — Encounter: Payer: Self-pay | Admitting: Podiatry

## 2022-03-30 DIAGNOSIS — M7661 Achilles tendinitis, right leg: Secondary | ICD-10-CM

## 2022-03-30 DIAGNOSIS — M8700 Idiopathic aseptic necrosis of unspecified bone: Secondary | ICD-10-CM

## 2022-03-30 MED ORDER — MELOXICAM 7.5 MG PO TABS: 7.5000 mg | ORAL_TABLET | Freq: Every day | ORAL | 0 refills | Status: DC | PRN

## 2022-03-30 NOTE — Progress Notes (Signed)
Subjective: Chief Complaint  Patient presents with   Follow-up    Patient is present today to see what the next step is after MRI on bilateral feet    62 year old male presents to the office with above concerns.  He has questions of the left foot still raises most of his issues on the right heel.  Otherwise she states he has been doing well without any injuries or changes otherwise.  No fevers or chills.  Objective: AAO x3, NAD DP/PT pulses palpable bilaterally, CRT less than 3 seconds There is continuation of tenderness on the medial aspect of the patient's left foot on the medial cuneiform, medial aspect.  There is minimal edema there is no erythema or warmth.  Flexor, extensor tendons appear to be intact.  The right side there is still tenderness palpation on the posterior aspect of the calcaneus on insertion of the Achilles tendon.  Including appears intact.  MMT 5/5. No pain with calf compression, swelling, warmth, erythema  Assessment: Left foot avascular necrosis; right Achilles tendonesis   Plan: -All treatment options discussed with the patient including all alternatives, risks, complications.  -Conservative as well as surgical options the left foot.  Refer him  to Dr. Nicki Reaper for surgical intervention if he wants to proceed -For the right side prescribed mobic. Discussed side effects of the medication and directed to stop if any are to occur and call the office.  We discussed stretching, icing.  Referral to physical  therapy. -Patient encouraged to call the office with any questions, concerns, change in symptoms.   Trula Slade DPM

## 2022-03-30 NOTE — Patient Instructions (Signed)

## 2022-04-09 ENCOUNTER — Other Ambulatory Visit: Payer: Self-pay | Admitting: Family Medicine

## 2022-04-09 ENCOUNTER — Encounter: Payer: Self-pay | Admitting: Gastroenterology

## 2022-04-09 DIAGNOSIS — I10 Essential (primary) hypertension: Secondary | ICD-10-CM
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
## 2022-04-20 ENCOUNTER — Ambulatory Visit: Payer: Managed Care, Other (non HMO) | Attending: Podiatry | Admitting: Physical Therapy

## 2022-04-20 ENCOUNTER — Encounter: Payer: Self-pay | Admitting: Physical Therapy

## 2022-04-20 ENCOUNTER — Other Ambulatory Visit: Payer: Self-pay

## 2022-04-20 DIAGNOSIS — R262 Difficulty in walking, not elsewhere classified: Secondary | ICD-10-CM | POA: Diagnosis present

## 2022-04-20 DIAGNOSIS — M25571 Pain in right ankle and joints of right foot: Secondary | ICD-10-CM

## 2022-04-20 DIAGNOSIS — M7661 Achilles tendinitis, right leg: Secondary | ICD-10-CM | POA: Diagnosis not present

## 2022-04-27 DIAGNOSIS — M7660 Achilles tendinitis, unspecified leg: Secondary | ICD-10-CM | POA: Insufficient documentation

## 2022-04-27 DIAGNOSIS — M722 Plantar fascial fibromatosis: Secondary | ICD-10-CM | POA: Insufficient documentation

## 2022-05-11 ENCOUNTER — Ambulatory Visit (AMBULATORY_SURGERY_CENTER): Payer: Managed Care, Other (non HMO)

## 2022-05-11 ENCOUNTER — Encounter: Payer: Self-pay | Admitting: Gastroenterology

## 2022-05-11 VITALS — Ht 67.0 in | Wt 150.0 lb

## 2022-05-11 DIAGNOSIS — Z1211 Encounter for screening for malignant neoplasm of colon: Secondary | ICD-10-CM

## 2022-05-11 MED ORDER — NA SULFATE-K SULFATE-MG SULF 17.5-3.13-1.6 GM/177ML PO SOLN: 1.0000 | Freq: Once | ORAL | 0 refills | Status: AC

## 2022-05-11 NOTE — Progress Notes (Signed)
No egg or soy allergy known to patient  No issues known to pt with past sedation with any surgeries or procedures Patient denies ever being told they had issues or difficulty with intubation  No FH of Malignant Hyperthermia Pt is not on diet pills Pt is not on  home 02  Pt is not on blood thinners  Pt denies issues with constipation  No A fib or A flutter Have any cardiac testing pending--no Pt instructed to use [email or web address] or GoodRx for a price reduction on prep  Patient's chart reviewed by Joseph Tyler CNRA prior to previsit and patient appropriate for the [facility].  Previsit completed and red dot placed by patient's name on their procedure day (on provider's schedule).   Can Ambulate without assistance

## 2022-05-18 ENCOUNTER — Ambulatory Visit: Payer: Managed Care, Other (non HMO) | Admitting: Family Medicine

## 2022-05-18 ENCOUNTER — Ambulatory Visit (INDEPENDENT_AMBULATORY_CARE_PROVIDER_SITE_OTHER): Payer: Managed Care, Other (non HMO) | Admitting: Family Medicine

## 2022-05-18 ENCOUNTER — Encounter: Payer: Self-pay | Admitting: Family Medicine

## 2022-05-18 VITALS — BP 160/80 | HR 59 | Ht 67.0 in | Wt 153.6 lb

## 2022-05-18 DIAGNOSIS — M87076 Idiopathic aseptic necrosis of unspecified foot: Secondary | ICD-10-CM

## 2022-05-18 DIAGNOSIS — I10 Essential (primary) hypertension: Secondary | ICD-10-CM

## 2022-05-18 DIAGNOSIS — A530 Latent syphilis, unspecified as early or late: Secondary | ICD-10-CM | POA: Diagnosis not present

## 2022-05-18 DIAGNOSIS — F01B18 Vascular dementia, moderate, with other behavioral disturbance: Secondary | ICD-10-CM | POA: Diagnosis not present

## 2022-05-18 NOTE — Assessment & Plan Note (Addendum)
No change from baseline. I again advised him to give up driving. He does not think he has gotten there yet. Neurology f/u discussed and he is still not interested in this.  Continue Statins.  I will continue to follow.

## 2022-05-18 NOTE — Assessment & Plan Note (Signed)
RPR checked. I will call him with his test results.

## 2022-05-18 NOTE — Assessment & Plan Note (Signed)
Not well controlled. Per the patient, his BP is elevated from stress. Plan to continue current dose of Lisinopril. F/U appointment made.

## 2022-05-18 NOTE — Assessment & Plan Note (Signed)
Recent MRI reviewed. I reviewed note from her orthopedic visit. He will benefit from surgery. He will complete PT first and if no improvement, may consider surgery. F/U with ortho as planned.

## 2022-05-18 NOTE — Progress Notes (Signed)
    SUBJECTIVE:   CHIEF COMPLAINT / HPI:   Osteonecrosis:  He followed closely with a podiatrist and was referred to orthopedic specialist at [facility] who recommended surgery. However, he is holding off on surgery for now. He endorsed left foot swelling and pain, which started 4 weeks ago, for which he was treated with A/B by his foot specialist. Since then, the pain and swelling have improved tremendously. He is currently undergoing PT. He requested disability form completion.  Latent syphilis: Following up for lab after treatment. No new concerns.   Cognitive impairement: He feels this is steady, and he is doing well in that regard.  HTN: He is compliant with his Lisinopril 10 mg QD. He stated that his BP is high today due to stress. Home BP her better.  PERTINENT  PMH / PSH: PMHx reviewed  OBJECTIVE:   BP (!) 160/80   Pulse (!) 59   Ht 5\' 7"  (1.702 m)   Wt 153 lb 9.6 oz (69.7 kg)   SpO2 100%   BMI 24.06 kg/m   Physical Exam Vitals and nursing note reviewed.  Cardiovascular:     Rate and Rhythm: Normal rate and regular rhythm.     Heart sounds: Normal heart sounds. No murmur heard. Pulmonary:     Effort: Pulmonary effort is normal. No respiratory distress.     Breath sounds: Normal breath sounds. No wheezing.  Feet:     Comments: Mild (+) non pitting swelling of the dorsum of his left foot with mild tenderness of the medial border of his left foot. No erythema. No calf swelling or erythema B/L.     ASSESSMENT/PLAN:   Essential hypertension, benign Not well controlled. Per the patient, his BP is elevated from stress. Plan to continue current dose of Lisinopril. F/U appointment made.   Avascular necrosis of bone of foot (HCC) Recent MRI reviewed. I reviewed note from her orthopedic visit. He will benefit from surgery. He will complete PT first and if no improvement, may consider surgery. F/U with ortho as planned.   Syphilis, latent RPR checked. I will  call him with his test results.   Vascular dementia (HCC) No change from baseline. I again advised him to give up driving. He does not think he has gotten there yet. Neurology f/u discussed and he is still not interested in this.  Continue Statins.  I will continue to follow.      Janit Pagan, MD [facility]

## 2022-05-18 NOTE — Patient Instructions (Signed)
Osteonecrosis  Osteonecrosis is death of bone tissue due to lack of blood supply. Without proper blood supply, the internal layer of the affected bone dies. Over time, small breaks form in the outer layer of the bone. If this process affects a bone near a joint, the joint may collapse or move out of place (dislocation). Joints that may be affected include the jaw, wrist, fingers, knee, and foot. Osteonecrosis most commonly affects: The hip joint, especially the top of the thigh bone (femoral head). The top of the upper arm bone (humeral head). You may hear other names for this condition. It can be called avascular necrosis, aseptic necrosis, or ischemic bone necrosis. What are the causes? This condition may be caused by: Damage or injury to a bone or joint. Using steroid medicine, such as prednisone, for a long time. Changes in the body's disease-fighting system (immune system). Changes in the body's hormones. Hormones are chemicals that regulate body processes. Exposure to a lot of radiation. What increases the risk? The following factors may make you more likely to develop this condition: Alcohol abuse. A history of joint injury. Long-term or frequent use of steroid medicines. Having a medical condition such as: HIV or AIDS. Diabetes. Sickle cell disease. Autoimmune disease. This is a disease in which the immune system attacks healthy tissues. What are the signs or symptoms? The main symptoms of this condition are: Pain. If an affected joint collapses, the pain may suddenly get severe. Decreased ability to move the affected bone or joint. How is this diagnosed? This condition may be diagnosed based on: Your symptoms and medical history. A physical exam. Imaging tests, such as X-rays, bone scans, and an MRI. How is this treated? Treatment for this condition may include: Pain medicine, such as NSAIDs. Medicine to improve bone growth. Avoiding placing any pressure or weight on the  affected area. If osteonecrosis occurs in your hip, ankle, or foot, you may need to use a device to help you move around. Devices for use include crutches or a rolling scooter. Physical therapy to help regain strength and motion in the affected area. Surgery, such as: Core decompression. One or more holes are placed in the bone for new blood vessels to grow into. This provides a renewed blood supply to the bone. This surgery may reduce pain and pressure in the affected bone and slow the destruction of bones and joints. Osteotomy. The bone is reshaped to reduce stress on the affected area of the joint. Bone grafting. Healthy bone from a different part of your body is used to replace damaged bone. Total joint replacement (arthroplasty). The affected surfaces of a bone are replaced with artificial parts (prostheses). Electrical stimulation (also called e-stim). During this procedure, a probe over the skin sends shock waves into the body. This may help to encourage new bone growth. High-pressure oxygen therapy (hyperbaric oxygen). This is rarely used. Follow these instructions at home: Activity If physical therapy was prescribed, do exercises as told by your health care provider. Ask your health care provider if the medicine prescribed to you requires you to avoid driving or using machinery. Do not use the injured limb to support your body weight until your health care provider says that you can. Use crutches or a rolling scooter. Return to your regular activities as told by your doctor. Ask your health care provider what activities are safe for you. Managing pain, stiffness, and swelling If directed, apply heat to the affected area as often as told by your  health care provider. Heat can reduce the stiffness of your muscles and joints. Use the heat source that your health care provider recommends, such as a moist heat pack or a heating pad. To do this: Place a towel between your skin and the heat  source. Leave the heat on for 20-30 minutes. Remove the heat if your skin turns bright red. This is especially important if you are unable to feel pain, heat, or cold. You may have a greater risk of getting burned. If directed, put ice on the affected area. Icing can help to relieve joint pain and swelling. To do this: Put ice in a plastic bag. Place a towel between your skin and the bag. Leave the ice on for 20 minutes, 2-3 times a day. General instructions  Take over-the-counter and prescription medicines only as told by your health care provider. If a bone in your arm or leg is affected, ask your health care provider if it is safe for you to drive. Do not use any products that contain nicotine or tobacco, such as cigarettes, e-cigarettes, and chewing tobacco. These can delay bone healing. If you need help quitting, ask your health care provider. Do not drink alcohol. Keep all follow-up visits as told by your health care provider. This is important. Where to find more information [employer] of Arthritis and Musculoskeletal and Skin Diseases: www.niams.http://www.myers.net/ Contact a health care provider if: You have pain that gets worse or does not get better with medicine. You have more difficulty moving your joint. Get help right away if: Your pain suddenly becomes severe. Summary Osteonecrosis, also called avascular necrosis, is death of bone tissue due to a lack of blood supply. Without proper blood supply, the internal layer of the affected bone dies. Over time, small breaks form in the outer layer of the bone. Causes of this condition include injury to the bone, use of certain medicines for a long time, or having an autoimmune disease. This condition is treated with medicine, physical therapy, and surgery. You may need to use heat or ice therapy at home. Avoid putting any pressure or weight on the affected area. You may need to use a rolling scooter or crutches to move around. Let your  health care provider know if you have pain that does not get better. Get help right away if you have sudden, severe pain. This information is not intended to replace advice given to you by your health care provider. Make sure you discuss any questions you have with your health care provider. Document Revised: 03/28/2019 Document Reviewed: 03/28/2019 Elsevier Patient Education  2023 [employer].

## 2022-05-19 ENCOUNTER — Telehealth: Payer: Self-pay | Admitting: Family Medicine

## 2022-05-19 NOTE — Telephone Encounter (Signed)
His Titre remains same but nor rising. I reviewed this with ID - Dr. Luciana Axe. He said this is expected up to a year or more. No additional treatment needed. He already completed 3 doses of Bicillin. May repeat test in 6 months. I discussed this with the patient. He verbalized understanding.

## 2022-05-19 NOTE — Progress Notes (Signed)
FMLA from completed and placed in the front office for faxing.

## 2022-05-20 LAB — RPR: RPR Ser Ql: REACTIVE — AB

## 2022-05-20 LAB — RPR, QUANT+TP ABS (REFLEX)
Rapid Plasma Reagin, Quant: 1:2 {titer} — ABNORMAL HIGH
T Pallidum Abs: REACTIVE — AB

## 2022-05-21 ENCOUNTER — Telehealth: Payer: Self-pay | Admitting: Family Medicine

## 2022-05-21 NOTE — Telephone Encounter (Signed)
Attempted to reach patient. No answer. LVM of message. Aquilla Solian, CMA

## 2022-05-21 NOTE — Telephone Encounter (Signed)
Hello team,  Please contact the patient that we received a message from his HR that his FMLA form is incomplete. I completed and fax the exact form he provided. Advised him to reach out to his HR to directly fax the document to [place] for completion. Thanks.

## 2022-05-24 NOTE — Telephone Encounter (Signed)
Patient is calling to see if Dr. Lum Babe received the copy of the document HR was refaxing Friday. I informed him her box was empty so she may already have it.   He would like for someone to call him and let him know if they did not receive it or call him to let him know when it has been completed.   The best call back is [phone]

## 2022-05-25 NOTE — Telephone Encounter (Signed)
FMLA form received, completed and placed in the fax box for processing. I also made a copy to be scanned to his file.

## 2022-06-01 ENCOUNTER — Encounter: Payer: Self-pay | Admitting: Family Medicine

## 2022-06-01 ENCOUNTER — Ambulatory Visit (INDEPENDENT_AMBULATORY_CARE_PROVIDER_SITE_OTHER): Payer: Managed Care, Other (non HMO) | Admitting: Family Medicine

## 2022-06-01 VITALS — BP 143/84 | HR 63 | Ht 67.0 in | Wt 152.2 lb

## 2022-06-01 DIAGNOSIS — F01B18 Vascular dementia, moderate, with other behavioral disturbance: Secondary | ICD-10-CM | POA: Diagnosis not present

## 2022-06-01 DIAGNOSIS — M87076 Idiopathic aseptic necrosis of unspecified foot: Secondary | ICD-10-CM | POA: Diagnosis not present

## 2022-06-01 DIAGNOSIS — I10 Essential (primary) hypertension: Secondary | ICD-10-CM

## 2022-06-01 NOTE — Assessment & Plan Note (Signed)
BP is close to his goal of < 140/90. I discussed increasing his Lisinopril from 10 mg to 20 mg QD. He declined. We would monitor for now on his current dose. F/U in 4 weeks for reassessment.

## 2022-06-01 NOTE — Patient Instructions (Addendum)
It was nice seeing you today. We will increase your Lisinopril to 20 mg daily in the future. Your BP goal is 120/70 to 140/90. Monitor BP closely at home. I will see you in 4 weeks for reassessment.

## 2022-06-01 NOTE — Progress Notes (Signed)
    SUBJECTIVE:   CHIEF COMPLAINT / HPI:   HTN: He is compliant with Lisinopril 10 mg QD. His last dose was this morning. He does not check his BP at home. No other concerns.  Disability/FMLA: He wants to apply for disability for his memory impairment and osteonecrosis. We already completed his FMLA paperwork. He will come in with his disability paperwork soon.  PERTINENT  PMH / PSH: PMHx reviewed  OBJECTIVE:   BP (!) 143/84   Pulse 63   Ht 5\' 7"  (1.702 m)   Wt 152 lb 3.2 oz (69 kg)   SpO2 100%   BMI 23.84 kg/m   Physical Exam Vitals and nursing note reviewed.  Cardiovascular:     Rate and Rhythm: Normal rate and regular rhythm.     Heart sounds: Normal heart sounds. No murmur heard. Pulmonary:     Effort: Pulmonary effort is normal. No respiratory distress.     Breath sounds: Normal breath sounds. No wheezing.      ASSESSMENT/PLAN:   Essential hypertension, benign BP is close to his goal of < 140/90. I discussed increasing his Lisinopril from 10 mg to 20 mg QD. He declined. We would monitor for now on his current dose. F/U in 4 weeks for reassessment.   Vascular dementia (HCC) Plan to complete disability paperwork once available. I provided information for him to contact DSS to initiate his disability application. He was appreciative of this.   Avascular necrosis of bone of foot (HCC) Plan to complete disability paperwork once available. I provided information for him to contact DSS to initiate his disability application. He was appreciative of this.      Janit Pagan, MD [facility]

## 2022-06-01 NOTE — Assessment & Plan Note (Signed)
Plan to complete disability paperwork once available. I provided information for him to contact DSS to initiate his disability application. He was appreciative of this.

## 2022-06-01 NOTE — Assessment & Plan Note (Signed)
Plan to complete disability paperwork once available. I provided information for him to contact DSS to initiate his disability application. He was appreciative of this.  

## 2022-06-08 ENCOUNTER — Encounter: Payer: Managed Care, Other (non HMO) | Admitting: Gastroenterology

## 2022-06-15 ENCOUNTER — Telehealth: Payer: Self-pay | Admitting: Gastroenterology

## 2022-06-15 DIAGNOSIS — Z1211 Encounter for screening for malignant neoplasm of colon: Secondary | ICD-10-CM

## 2022-06-15 MED ORDER — NA SULFATE-K SULFATE-MG SULF 17.5-3.13-1.6 GM/177ML PO SOLN: 1.0000 | Freq: Once | ORAL | 0 refills | Status: AC

## 2022-06-15 NOTE — Telephone Encounter (Signed)
Inbound call from patient wanting to speak with a nurse, patient stated he was told his procedure was today at 4:00 pm and he did his prep .However patient original appt was for 5/21 patient was informed that these appt was reschedule for 6/10. Now he is not sure what to do.Please advise

## 2022-06-15 NOTE — Telephone Encounter (Signed)
Patient called again, advised patient he would need to have prep resent to pharmacy for procedure on 6/10. Wishes to speak to a nurse to update instructions and resend prep medication.

## 2022-06-15 NOTE — Telephone Encounter (Signed)
Returned patient call.  Re-sent SUPREP prep to his pharmacy and mailed new instructions to home address.

## 2022-06-28 ENCOUNTER — Encounter: Payer: Self-pay | Admitting: Gastroenterology

## 2022-06-28 ENCOUNTER — Ambulatory Visit: Payer: Managed Care, Other (non HMO) | Admitting: Gastroenterology

## 2022-06-28 VITALS — BP 126/81 | HR 60 | Temp 97.1°F | Resp 13 | Ht 67.0 in | Wt 150.0 lb

## 2022-06-28 DIAGNOSIS — Z1211 Encounter for screening for malignant neoplasm of colon: Secondary | ICD-10-CM

## 2022-06-28 MED ORDER — SODIUM CHLORIDE 0.9 % IV SOLN: 500.0000 mL | INTRAVENOUS | Status: DC

## 2022-06-28 NOTE — Op Note (Signed)
[facility] Patient Name: Joseph Tyler Procedure Date: 06/28/2022 3:51 PM MRN: [id number] Endoscopist: Sherilyn Cooter [name] , MD, [id number] Age: 62 Referring MD:  Date of Birth: Mar 11, 1960 Gender: Male Account #: [id number] Procedure:                Colonoscopy Indications:              Screening for colorectal malignant neoplasm                           no polyps on 2011 diagnostic colonoscopy for heme                            positive stool Medicines:                Monitored Anesthesia Care Procedure:                Pre-Anesthesia Assessment:                           - Prior to the procedure, a History and Physical                            was performed, and patient medications and                            allergies were reviewed. The patient's tolerance of                            previous anesthesia was also reviewed. The risks                            and benefits of the procedure and the sedation                            options and risks were discussed with the patient.                            All questions were answered, and informed consent                            was obtained. Prior Anticoagulants: The patient has                            taken no anticoagulant or antiplatelet agents. ASA                            Grade Assessment: II - A patient with mild systemic                            disease. After reviewing the risks and benefits,                            the patient was deemed in satisfactory condition to  undergo the procedure.                           After obtaining informed consent, the colonoscope                            was passed under direct vision. Throughout the                            procedure, the patient's blood pressure, pulse, and                            oxygen saturations were monitored continuously. The                            Olympus CF-HQ190L SN [id number] was introduced  through                            the anus and advanced to the the terminal ileum,                            with identification of the appendiceal orifice and                            IC valve. The colonoscopy was performed without                            difficulty. The patient tolerated the procedure                            well. The quality of the bowel preparation was                            excellent. The ileocecal valve, appendiceal                            orifice, and rectum were photographed. The bowel                            preparation used was SUPREP via split dose                            instruction. Scope In: 3:57:01 PM Scope Out: 4:08:26 PM Scope Withdrawal Time: 0 hours 9 minutes 15 seconds  Total Procedure Duration: 0 hours 11 minutes 25 seconds  Findings:                 The perianal and digital rectal examinations were                            normal.                           The terminal ileum appeared normal.  Repeat examination of right colon under NBI                            performed.                           Internal hemorrhoids were found.                           The exam was otherwise without abnormality on                            direct and retroflexion views. Complications:            No immediate complications. Estimated Blood Loss:     Estimated blood loss: none. Impression:               - The examined portion of the ileum was normal.                           - Internal hemorrhoids.                           - The examination was otherwise normal on direct                            and retroflexion views.                           - No specimens collected. Recommendation:           - Patient has a contact number available for                            emergencies. The signs and symptoms of potential                            delayed complications were discussed with the                             patient. Return to normal activities tomorrow.                            Written discharge instructions were provided to the                            patient.                           - Resume previous diet.                           - Continue present medications.                           - Repeat colonoscopy in 10 years for screening  purposes. (with Dr. Meridee Score, who saw this                            patient in the office in 2020) Keiron Iodice [name], MD 06/28/2022 4:12:11 PM This report has been signed electronically.

## 2022-06-28 NOTE — Progress Notes (Unsigned)
History and Physical:  This patient presents for endoscopic testing for: Encounter Diagnosis  Name Primary?   Special screening for malignant neoplasms, colon Yes    Average risk for colorectal cancer.  First screening exam.  Had a colonoscopy for Heme positive stool in 2011 - no polyps found on that exam. Patient denies current abdominal pain, rectal bleeding, constipation or diarrhea. (Saw Dr. Meridee Score in 2020 for abdominal pain)   Patient is otherwise without complaints or active issues today.   Past Medical History: Past Medical History:  Diagnosis Date   Abdominal pain, chronic, right upper quadrant 03/06/2018   Back pain    HTN (hypertension)    on and off since 2002   Hyperlipidemia    Kidney stone    Memory changes      Past Surgical History: Past Surgical History:  Procedure Laterality Date   No past surgery      Allergies: Allergies  Allergen Reactions   Other     Eggplant- swelling     Outpatient Meds: Current Outpatient Medications  Medication Sig Dispense Refill   ibuprofen (ADVIL) 400 MG tablet Take 1 tablet (400 mg total) by mouth every 12 (twelve) hours as needed. 30 tablet 0   lisinopril (ZESTRIL) 10 MG tablet TAKE 1 TABLET BY MOUTH EVERY DAY 90 tablet 1   rosuvastatin (CRESTOR) 5 MG tablet Take 1 tablet (5 mg total) by mouth daily. 90 tablet 3   tamsulosin (FLOMAX) 0.4 MG CAPS capsule Take 2 capsules (0.8 mg total) by mouth daily. 180 capsule 1   Current Facility-Administered Medications  Medication Dose Route Frequency Provider Last Rate Last Admin   0.9 %  sodium chloride infusion  500 mL Intravenous Continuous Danis, Starr Lake III, MD          ___________________________________________________________________ Objective   Exam:  BP (!) 142/95   Pulse 64   Temp (!) 97.1 F (36.2 C) (Skin)   Ht 5\' 7"  (1.702 m)   Wt 150 lb (68 kg)   SpO2 98%   BMI 23.49 kg/m   CV: regular , S1/S2 Resp: clear to auscultation bilaterally, normal  RR and effort noted GI: soft, no tenderness, with active bowel sounds.   Assessment: Encounter Diagnosis  Name Primary?   Special screening for malignant neoplasms, colon Yes     Plan: Colonoscopy   The benefits and risks of the planned procedure were described in detail with the patient or (when appropriate) their health care proxy.  Risks were outlined as including, but not limited to, bleeding, infection, perforation, adverse medication reaction leading to cardiac or pulmonary decompensation, pancreatitis (if ERCP).  The limitation of incomplete mucosal visualization was also discussed.  No guarantees or warranties were given.  The patient is appropriate for an endoscopic procedure in the ambulatory setting.   - Amada Jupiter, MD

## 2022-06-28 NOTE — Progress Notes (Unsigned)
Sedate, gd SR, tolerated procedure well, VSS, report to RN 

## 2022-06-28 NOTE — Patient Instructions (Signed)
Impression/Recommendations:  Hemorrhoid handout given to patient.  Resume previous diet. Continue present medications.  Repeat colonoscopy in 10 years for screening purposes (with Dr. Meridee Score, who saw the patient in the office in 2020.)  YOU HAD AN ENDOSCOPIC PROCEDURE TODAY AT THE [facility]:   Refer to the procedure report that was given to you for any specific questions about what was found during the examination.  If the procedure report does not answer your questions, please call your gastroenterologist to clarify.  If you requested that your care partner not be given the details of your procedure findings, then the procedure report has been included in a sealed envelope for you to review at your convenience later.  YOU SHOULD EXPECT: Some feelings of bloating in the abdomen. Passage of more gas than usual.  Walking can help get rid of the air that was put into your GI tract during the procedure and reduce the bloating. If you had a lower endoscopy (such as a colonoscopy or flexible sigmoidoscopy) you may notice spotting of blood in your stool or on the toilet paper. If you underwent a bowel prep for your procedure, you may not have a normal bowel movement for a few days.  Please Note:  You might notice some irritation and congestion in your nose or some drainage.  This is from the oxygen used during your procedure.  There is no need for concern and it should clear up in a day or so.  SYMPTOMS TO REPORT IMMEDIATELY:  Following lower endoscopy (colonoscopy or flexible sigmoidoscopy):  Excessive amounts of blood in the stool  Significant tenderness or worsening of abdominal pains  Swelling of the abdomen that is new, acute  Fever of 100F or higher  For urgent or emergent issues, a gastroenterologist can be reached at any hour by calling [phone]. Do not use MyChart messaging for urgent concerns.    DIET:  We do recommend a small meal at first, but then you may  proceed to your regular diet.  Drink plenty of fluids but you should avoid alcoholic beverages for 24 hours.  ACTIVITY:  You should plan to take it easy for the rest of today and you should NOT DRIVE or use heavy machinery until tomorrow (because of the sedation medicines used during the test).    FOLLOW UP: Our staff will call the number listed on your records the next business day following your procedure.  We will call around 7:15- 8:00 am to check on you and address any questions or concerns that you may have regarding the information given to you following your procedure. If we do not reach you, we will leave a message.     If any biopsies were taken you will be contacted by phone or by letter within the next 1-3 weeks.  Please call us at [phone] if you have not heard about the biopsies in 3 weeks.    SIGNATURES/CONFIDENTIALITY: You and/or your care partner have signed paperwork which will be entered into your electronic medical record.  These signatures attest to the fact that that the information above on your After Visit Summary has been reviewed and is understood.  Full responsibility of the confidentiality of this discharge information lies with you and/or your care-partner.

## 2022-06-28 NOTE — Progress Notes (Unsigned)
Patient states there have been no changes to medical or surgical history since time of pre-visit. 

## 2022-06-29 ENCOUNTER — Telehealth: Payer: Self-pay | Admitting: *Deleted

## 2022-06-29 NOTE — Telephone Encounter (Signed)
Left message on f/u call 

## 2022-07-13 ENCOUNTER — Telehealth: Payer: Self-pay

## 2022-07-13 ENCOUNTER — Encounter: Payer: Self-pay | Admitting: Family Medicine

## 2022-07-13 ENCOUNTER — Ambulatory Visit (INDEPENDENT_AMBULATORY_CARE_PROVIDER_SITE_OTHER): Payer: Managed Care, Other (non HMO) | Admitting: Family Medicine

## 2022-07-13 ENCOUNTER — Ambulatory Visit (HOSPITAL_COMMUNITY)
Admission: RE | Admit: 2022-07-13 | Discharge: 2022-07-13 | Disposition: A | Discharge status: Home / Self Care | Payer: Managed Care, Other (non HMO) | Source: Ambulatory Visit | Attending: Family Medicine | Admitting: Family Medicine

## 2022-07-13 VITALS — BP 150/91 | HR 65 | Ht 67.0 in | Wt 153.8 lb

## 2022-07-13 DIAGNOSIS — M87076 Idiopathic aseptic necrosis of unspecified foot: Secondary | ICD-10-CM

## 2022-07-13 DIAGNOSIS — L608 Other nail disorders: Secondary | ICD-10-CM | POA: Insufficient documentation

## 2022-07-13 DIAGNOSIS — M879 Osteonecrosis, unspecified: Secondary | ICD-10-CM

## 2022-07-13 DIAGNOSIS — I10 Essential (primary) hypertension: Secondary | ICD-10-CM

## 2022-07-13 DIAGNOSIS — R079 Chest pain, unspecified: Secondary | ICD-10-CM | POA: Diagnosis not present

## 2022-07-13 DIAGNOSIS — D573 Sickle-cell trait: Secondary | ICD-10-CM | POA: Diagnosis not present

## 2022-07-13 DIAGNOSIS — L309 Dermatitis, unspecified: Secondary | ICD-10-CM | POA: Insufficient documentation

## 2022-07-13 MED ORDER — TRIAMCINOLONE ACETONIDE 0.5 % EX OINT: 1.0000 | TOPICAL_OINTMENT | Freq: Two times a day (BID) | CUTANEOUS | 0 refills | Status: DC

## 2022-07-13 MED ORDER — PREDNISONE 20 MG PO TABS: 40.0000 mg | ORAL_TABLET | Freq: Every day | ORAL | 0 refills | Status: AC

## 2022-07-13 NOTE — Assessment & Plan Note (Signed)
Hgb Electrophoresis ordered as requested.

## 2022-07-13 NOTE — Telephone Encounter (Signed)
Eman Morimoto  MRN [id number]            The following CPT codes for the echo do no need prior auth. The Conformation Code (CC) is listed beside the CPT Code.  [id number]  CC [id number]  CC [id number] CC [id number] CC [id number] CC [id number] CC [id number]  Information has been sent to Pablo Ledger with Echo lab.  Sunday Spillers, CMA

## 2022-07-13 NOTE — Progress Notes (Signed)
SUBJECTIVE:   CHIEF COMPLAINT / HPI:   HTN: Her for follow-up. He missed his Lisinopril 10 mg Qam this morning because he was rushing to the clinic. He is otherwise compliant with his med.    Skin rash: C/O itchy rash on his left ankle, groin, and lower back, which started about 2-3 weeks ago following his colonoscopy. He feels he might have reacted to the  GI prep solution for his colon cancer screen.   Osteonecrosis/Sickle Cell Trait:  C/O left foot pain. His pain has spread from his left foot's lateral to mid area. He has intermittent swelling and was recently prescribed antibiotics for this, with a major improvement. He denies any recent trauma. He asked for treatment with A/B again. He also wonders if his SCT is related to his osteonecrosis. Although he has a hx of SCT, he denies having gotten Hgb Electrophoresis done. He requested this test today to confirm his diagnosis. He continues to work on his disability papers and retirement.   Chest pain: The patient worries that he might have cardiovascular issues. He endorsed intermittent chest pain on and off for months. Currently asymptomatic and has no recent episode. He, however, requested a referral to Cardiology for further evaluation.   Left 1st Toenail discoloration: Identified on exam. Denies any injury to his toe. Denies any pain.   PERTINENT  PMH / PSH: PMHx reviewed  OBJECTIVE:   BP (!) 150/91   Pulse 65   Ht 5\' 7"  (1.702 m)   Wt 153 lb 12.8 oz (69.8 kg)   SpO2 100%   BMI 24.09 kg/m   Physical Exam Vitals and nursing note reviewed.  Cardiovascular:     Rate and Rhythm: Normal rate and regular rhythm.     Pulses: Normal pulses.     Heart sounds: Normal heart sounds. No murmur heard. Pulmonary:     Effort: Pulmonary effort is normal. No respiratory distress.     Breath sounds: Normal breath sounds. No wheezing.  Musculoskeletal:     Comments: Normal dorsalis tibia pulsation B/L  Skin:    Comments:  Maculopapular, hyperpigmented scaly patch on his left ankle medially measuring about 3 cm by 4 cm circumferentially.  Small area of black pigmentation of his left 1st toenail with neg hutchinson sign. No rash on his back or lower abdomen. Will not complete full groin exam today - asked for treatment. Can reeval if no improvement.         ASSESSMENT/PLAN:   Essential hypertension, benign BP elevated today due to medication non-adherence. I advised him to start taking his BP med at nighttime. Monitor BP closely on current regimen and f/u in 2-4 weeks. He agreed with the plan.   Dermatitis Differentials include allergic vs irritant dermatitis Topical triamcinolone prescribed. F/U soon if no improvement. He agreed with the plan.   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 He does not want to use NSAID a lot. We will trial oral steroid for a few days for his pain. Prednisone 40 mg daily x 5 days ordered. Xray of his foot ordered. If suspicious for osteomyelitis, we will treat with A/B He agreed with the plan.  Sickle cell trait (HCC) Hgb Electrophoresis ordered as requested.   Chest pain Sounds mostly anxiety related. He is currently asymptomatic. ECHO ordered. Cardiology referral placed as requested.   Discoloration of nail Left 1st toenail. Differentials include subungual hematoma vs subungual melanoma Neg Hutchinson sign. Patient tof/u  in 2-4 weeks for reassessment or sooner if worsening. Consider podiatry referral at the time. He agreed with the plan. F/U appointment made.     More than 50% of this 45 minutes encounter was spent on evaluation, counseling, record review and coordination of care.  Janit Pagan, MD [facility]

## 2022-07-13 NOTE — Assessment & Plan Note (Signed)
Differentials include allergic vs irritant dermatitis Topical triamcinolone prescribed. F/U soon if no improvement. He agreed with the plan.

## 2022-07-13 NOTE — Patient Instructions (Addendum)
It was nice seeing you today. Please start taking your blood pressure medication at nighttime. I'll like to see you back in 4 weeks. Please go get your left ankle xray as soon as possible. I will call you soon with the test result.

## 2022-07-13 NOTE — Assessment & Plan Note (Signed)
Sounds mostly anxiety related. He is currently asymptomatic. ECHO ordered. Cardiology referral placed as requested.

## 2022-07-13 NOTE — Assessment & Plan Note (Signed)
Left 1st toenail. Differentials include subungual hematoma vs subungual melanoma Neg Hutchinson sign. Patient tof/u in 2-4 weeks for reassessment or sooner if worsening. Consider podiatry referral at the time. He agreed with the plan. F/U appointment made.

## 2022-07-13 NOTE — Assessment & Plan Note (Signed)
BP elevated today due to medication non-adherence. I advised him to start taking his BP med at nighttime. Monitor BP closely on current regimen and f/u in 2-4 weeks. He agreed with the plan.

## 2022-07-13 NOTE — Assessment & Plan Note (Signed)
He has good f/u plan with Podiatrist. He does not want to use NSAID a lot. We will trial oral steroid for a few days for his pain. Prednisone 40 mg daily x 5 days ordered. Xray of his foot ordered. If suspicious for osteomyelitis, we will treat with A/B He agreed with the plan.

## 2022-07-16 ENCOUNTER — Telehealth: Payer: Self-pay | Admitting: Family Medicine

## 2022-07-16 DIAGNOSIS — R413 Other amnesia: Secondary | ICD-10-CM

## 2022-07-16 DIAGNOSIS — R4689 Other symptoms and signs involving appearance and behavior: Secondary | ICD-10-CM

## 2022-07-16 LAB — HGB SOLUBILITY: Hgb Solubility: POSITIVE — AB

## 2022-07-16 LAB — HGB FRACTIONATION CASCADE
Hgb A2: 2.8 % (ref 1.8–3.2)
Hgb A: 57.3 % — ABNORMAL LOW (ref 96.4–98.8)
Hgb F: 0 % (ref 0.0–2.0)
Hgb S: 39.9 % — ABNORMAL HIGH

## 2022-07-16 NOTE — Telephone Encounter (Signed)
Hbg Electrophoresis discussed. +Sickle Cell trait He wonders if his osteonecrosis is related to his SCT or Syphilis.  Not 100%. However, may obtain bone biopsy to r/o syphilis. He'll discuss with his orthopedic doc. He requested referral to psych again for his mental health. Referral placed.

## 2022-07-19 ENCOUNTER — Telehealth: Payer: Self-pay | Admitting: Family Medicine

## 2022-07-19 NOTE — Telephone Encounter (Signed)
I called and discussed xray result with him. No bone infection. Osteonecrosis remains stable. He has no further questions.  Narrative & Impression CLINICAL DATA:  left foot osteonecrosis, r/o infection   EXAM: LEFT FOOT - COMPLETE 3+ VIEW   COMPARISON:  11/02/2021.  MRI 12/22/2021   FINDINGS: Again noted is sclerosis throughout medial cuneiform of the left foot and to a lesser extent the middle cuneiform compatible with osteo necrosis. No change since prior study. No bone destruction to suggest osteomyelitis. No acute bony abnormality. Specifically, no fracture, subluxation, or dislocation. Plantar calcaneal spur. Soft tissues unremarkable.   IMPRESSION: Stable osteonecrosis of the medial and middle cuneiforms.   No acute bony abnormality

## 2022-07-21 ENCOUNTER — Other Ambulatory Visit: Payer: Self-pay | Admitting: Gastroenterology

## 2022-07-21 ENCOUNTER — Other Ambulatory Visit: Payer: Self-pay | Admitting: Family Medicine

## 2022-07-21 DIAGNOSIS — Z1211 Encounter for screening for malignant neoplasm of colon: Secondary | ICD-10-CM

## 2022-08-05 ENCOUNTER — Other Ambulatory Visit (HOSPITAL_COMMUNITY): Payer: Managed Care, Other (non HMO)

## 2022-08-09 NOTE — Addendum Note (Signed)
Addended by: Janit Pagan T on: 08/09/2022 07:58 AM   Modules accepted: Orders

## 2022-08-10 ENCOUNTER — Other Ambulatory Visit: Payer: Self-pay | Admitting: Family Medicine

## 2022-08-10 ENCOUNTER — Ambulatory Visit (HOSPITAL_COMMUNITY)
Admission: RE | Admit: 2022-08-10 | Discharge: 2022-08-10 | Disposition: A | Discharge status: Home / Self Care | Payer: Managed Care, Other (non HMO) | Source: Ambulatory Visit | Attending: Family Medicine | Admitting: Family Medicine

## 2022-08-10 ENCOUNTER — Encounter: Payer: Self-pay | Admitting: Family Medicine

## 2022-08-10 ENCOUNTER — Telehealth: Payer: Self-pay

## 2022-08-10 ENCOUNTER — Ambulatory Visit (INDEPENDENT_AMBULATORY_CARE_PROVIDER_SITE_OTHER): Payer: Managed Care, Other (non HMO) | Admitting: Family Medicine

## 2022-08-10 VITALS — BP 130/70 | HR 64 | Ht 67.0 in | Wt 152.4 lb

## 2022-08-10 DIAGNOSIS — M87076 Idiopathic aseptic necrosis of unspecified foot: Secondary | ICD-10-CM

## 2022-08-10 DIAGNOSIS — I6782 Cerebral ischemia: Secondary | ICD-10-CM | POA: Insufficient documentation

## 2022-08-10 DIAGNOSIS — E785 Hyperlipidemia, unspecified: Secondary | ICD-10-CM | POA: Insufficient documentation

## 2022-08-10 DIAGNOSIS — I10 Essential (primary) hypertension: Secondary | ICD-10-CM

## 2022-08-10 DIAGNOSIS — M545 Low back pain, unspecified: Secondary | ICD-10-CM

## 2022-08-10 DIAGNOSIS — R5383 Other fatigue: Secondary | ICD-10-CM | POA: Diagnosis not present

## 2022-08-10 DIAGNOSIS — R079 Chest pain, unspecified: Secondary | ICD-10-CM | POA: Insufficient documentation

## 2022-08-10 DIAGNOSIS — L608 Other nail disorders: Secondary | ICD-10-CM

## 2022-08-10 DIAGNOSIS — M549 Dorsalgia, unspecified: Secondary | ICD-10-CM | POA: Insufficient documentation

## 2022-08-10 LAB — ECHOCARDIOGRAM COMPLETE: Height: 67 in

## 2022-08-10 MED ORDER — TAMSULOSIN HCL 0.4 MG PO CAPS: 0.8000 mg | ORAL_CAPSULE | Freq: Every day | ORAL | 1 refills | Status: DC

## 2022-08-10 MED ORDER — MELOXICAM 15 MG PO TABS: 15.0000 mg | ORAL_TABLET | Freq: Every day | ORAL | 1 refills | Status: DC | PRN

## 2022-08-10 NOTE — Progress Notes (Signed)
I received a message from lab that CBC was not done for this visit as planned. CMA contacted the patient to schedule a lab appointment. Future order placed.

## 2022-08-10 NOTE — Progress Notes (Signed)
Echocardiogram 2D Echocardiogram has been performed.  Joseph Tyler 08/10/2022, 1:33 PM

## 2022-08-10 NOTE — Telephone Encounter (Signed)
Spoke with patient made lab appt. For tomorrow at 11:45. Aquilla Solian, CMA

## 2022-08-10 NOTE — Assessment & Plan Note (Signed)
MRI brain done for memory loss showed:Moderate chronic small-vessel ischemic changes of the cerebral hemispheric white matter. Likely related to vascular dementia No hx of acute CVA We will continue Moderate intensity Statins for now based on his ASCVD risk calc LDL currently above 70 Plan to repeat FLP in few months - consider taper off in the future He agreed with the plan

## 2022-08-10 NOTE — Patient Instructions (Signed)
Hypertension, Adult Hypertension is another name for high blood pressure. High blood pressure forces your heart to work harder to pump blood. This can cause problems over time. There are two numbers in a blood pressure reading. There is a top number (systolic) over a bottom number (diastolic). It is best to have a blood pressure that is below 120/80. What are the causes? The cause of this condition is not known. Some other conditions can lead to high blood pressure. What increases the risk? Some lifestyle factors can make you more likely to develop high blood pressure: Smoking. Not getting enough exercise or physical activity. Being overweight. Having too much fat, sugar, calories, or salt (sodium) in your diet. Drinking too much alcohol. Other risk factors include: Having any of these conditions: Heart disease. Diabetes. High cholesterol. Kidney disease. Obstructive sleep apnea. Having a family history of high blood pressure and high cholesterol. Age. The risk increases with age. Stress. What are the signs or symptoms? High blood pressure may not cause symptoms. Very high blood pressure (hypertensive crisis) may cause: Headache. Fast or uneven heartbeats (palpitations). Shortness of breath. Nosebleed. Vomiting or feeling like you may vomit (nauseous). Changes in how you see. Very bad chest pain. Feeling dizzy. Seizures. How is this treated? This condition is treated by making healthy lifestyle changes, such as: Eating healthy foods. Exercising more. Drinking less alcohol. Your doctor may prescribe medicine if lifestyle changes do not help enough and if: Your top number is above 130. Your bottom number is above 80. Your personal target blood pressure may vary. Follow these instructions at home: Eating and drinking  If told, follow the DASH eating plan. To follow this plan: Fill one half of your plate at each meal with fruits and vegetables. Fill one fourth of your plate  at each meal with whole grains. Whole grains include whole-wheat pasta, brown rice, and whole-grain bread. Eat or drink low-fat dairy products, such as skim milk or low-fat yogurt. Fill one fourth of your plate at each meal with low-fat (lean) proteins. Low-fat proteins include fish, chicken without skin, eggs, beans, and tofu. Avoid fatty meat, cured and processed meat, or chicken with skin. Avoid pre-made or processed food. Limit the amount of salt in your diet to less than 1,500 mg each day. Do not drink alcohol if: Your doctor tells you not to drink. You are pregnant, may be pregnant, or are planning to become pregnant. If you drink alcohol: Limit how much you have to: 0-1 drink a day for women. 0-2 drinks a day for men. Know how much alcohol is in your drink. In the [place] one drink equals one 12 oz bottle of beer (355 mL), one 5 oz glass of wine (148 mL), or one 1 oz glass of hard liquor (44 mL). Lifestyle  Work with your doctor to stay at a healthy weight or to lose weight. Ask your doctor what the best weight is for you. Get at least 30 minutes of exercise that causes your heart to beat faster (aerobic exercise) most days of the week. This may include walking, swimming, or biking. Get at least 30 minutes of exercise that strengthens your muscles (resistance exercise) at least 3 days a week. This may include lifting weights or doing Pilates. Do not smoke or use any products that contain nicotine or tobacco. If you need help quitting, ask your doctor. Check your blood pressure at home as told by your doctor. Keep all follow-up visits. Medicines Take over-the-counter and prescription medicines   only as told by your doctor. Follow directions carefully. Do not skip doses of blood pressure medicine. The medicine does not work as well if you skip doses. Skipping doses also puts you at risk for problems. Ask your doctor about side effects or reactions to medicines that you should watch  for. Contact a doctor if: You think you are having a reaction to the medicine you are taking. You have headaches that keep coming back. You feel dizzy. You have swelling in your ankles. You have trouble with your vision. Get help right away if: You get a very bad headache. You start to feel mixed up (confused). You feel weak or numb. You feel faint. You have very bad pain in your: Chest. Belly (abdomen). You vomit more than once. You have trouble breathing. These symptoms may be an emergency. Get help right away. Call 911. Do not wait to see if the symptoms will go away. Do not drive yourself to the hospital. Summary Hypertension is another name for high blood pressure. High blood pressure forces your heart to work harder to pump blood. For most people, a normal blood pressure is less than 120/80. Making healthy choices can help lower blood pressure. If your blood pressure does not get lower with healthy choices, you may need to take medicine. This information is not intended to replace advice given to you by your health care provider. Make sure you discuss any questions you have with your health care provider. Document Revised: 10/23/2020 Document Reviewed: 10/23/2020 Elsevier Patient Education  2024 [employer]vier Inc.  

## 2022-08-10 NOTE — Assessment & Plan Note (Signed)
F/U with podiatry as planned

## 2022-08-10 NOTE — Assessment & Plan Note (Signed)
BP improved. Continue current regimen.

## 2022-08-10 NOTE — Assessment & Plan Note (Signed)
Etiology unclear Lab work checked today - Cmet, CBC, TSH I will contact him soon with his results

## 2022-08-10 NOTE — Assessment & Plan Note (Signed)
With intermittent LL numbness Currently asymptomatic No neurologic deficit on exam ?? Lumbar radiculopathy Mobic prescribed for pain MRI/CT spine discussed - he'll defer for now Monitor symptoms closely and plan for further work-up if persistent

## 2022-08-10 NOTE — Assessment & Plan Note (Signed)
Likely subungual hematoma, less likely melanoma However, he denies foot injury He as upcoming podiatry eval 1 week in August I have asked him to show this to his foot specialist for evaluation and recommendation I will also send notification to his podiatrist

## 2022-08-10 NOTE — Progress Notes (Signed)
    SUBJECTIVE:   CHIEF COMPLAINT / HPI:   HTN: Compliant with Lisinopril 10 mg QD. He is here for f/u.   Feet pain:  Still endorses B/L foot pain. Pain is about 10/10 with ambulation. Meloxicam 7.5 mg helps and will like to request a refill. He has f/u with Podiatry in the coming week. He stated that he feels his pain is all over.  Toenail discoloration: Denies any pain and no change from previous. He again denies any toe injury.  Fatigue: Feels tired in general. Ongoing for weeks. Feels like it is getting worse.  Back pain/Numbness: C/O numbness of his feet after prolonged seating on the toilet bowl. Also endorses intermitted lumbar spine pain. He denies any symptoms currently.  PERTINENT  PMH / PSH: PMX reviewed  OBJECTIVE:   BP 130/70   Pulse 64   Ht 5\' 7"  (1.702 m)   Wt 152 lb 6 oz (69.1 kg)   SpO2 98%   BMI 23.87 kg/m   Physical Exam Vitals and nursing note reviewed.  Cardiovascular:     Rate and Rhythm: Normal rate and regular rhythm.     Heart sounds: Normal heart sounds. No murmur heard. Pulmonary:     Effort: Pulmonary effort is normal. No respiratory distress.     Breath sounds: Normal breath sounds. No wheezing.  Feet:     Comments: Mild swelling and tenderness of the dorsum of his foot B/L - unchanged from the previous  Left 1st toe hyperpigmentation as noted from his recent visit Neurological:     Mental Status: He is oriented to person, place, and time.  Psychiatric:        Mood and Affect: Mood normal.      ASSESSMENT/PLAN:   Essential hypertension, benign BP improved. Continue current regimen  Chronic cerebral ischemia MRI brain done for memory loss showed:Moderate chronic small-vessel ischemic changes of the cerebral hemispheric white matter. Likely related to vascular dementia No hx of acute CVA We will continue Moderate intensity Statins for now based on his ASCVD risk calc LDL currently above 70 Plan to repeat FLP in few months -  consider taper off in the future He agreed with the plan  Avascular necrosis of bone of foot (HCC) F/U with podiatry as planned  Discoloration of nail Likely subungual hematoma, less likely melanoma However, he denies foot injury He as upcoming podiatry eval 1 week in August I have asked him to show this to his foot specialist for evaluation and recommendation I will also send notification to his podiatrist  Fatigue Etiology unclear Lab work checked today - Cmet, CBC, TSH I will contact him soon with his results  Back pain With intermittent LL numbness Currently asymptomatic No neurologic deficit on exam ?? Lumbar radiculopathy Mobic prescribed for pain MRI/CT spine discussed - he'll defer for now Monitor symptoms closely and plan for further work-up if persistent     Janit Pagan, MD [facility]

## 2022-08-11 ENCOUNTER — Other Ambulatory Visit: Payer: Managed Care, Other (non HMO)

## 2022-08-11 LAB — CMP14+EGFR
ALT: 7 IU/L (ref 0–44)
AST: 9 IU/L (ref 0–40)
Albumin: 3.9 g/dL (ref 3.9–4.9)
Alkaline Phosphatase: 135 IU/L — ABNORMAL HIGH (ref 44–121)
BUN/Creatinine Ratio: 15 (ref 10–24)
BUN: 15 mg/dL (ref 8–27)
Bilirubin Total: 0.3 mg/dL (ref 0.0–1.2)
CO2: 24 mmol/L (ref 20–29)
Calcium: 9.6 mg/dL (ref 8.6–10.2)
Chloride: 105 mmol/L (ref 96–106)
Creatinine, Ser: 1.02 mg/dL (ref 0.76–1.27)
Globulin, Total: 2.5 g/dL (ref 1.5–4.5)
Glucose: 91 mg/dL (ref 70–99)
Potassium: 4.5 mmol/L (ref 3.5–5.2)
Sodium: 139 mmol/L (ref 134–144)
Total Protein: 6.4 g/dL (ref 6.0–8.5)
eGFR: 83 mL/min/{1.73_m2} (ref >59)

## 2022-08-11 LAB — ECHOCARDIOGRAM COMPLETE
Area-P 1/2: 3.48 cm2
Calc EF: 50.5 %
S' Lateral: 3.4 cm
Single Plane A2C EF: 52.7 %
Single Plane A4C EF: 48.5 %
Weight: 2438 oz

## 2022-08-11 LAB — CBC

## 2022-08-11 LAB — TSH: TSH: 1.48 u[IU]/mL (ref 0.450–4.500)

## 2022-08-12 ENCOUNTER — Telehealth: Payer: Self-pay | Admitting: Family Medicine

## 2022-08-12 ENCOUNTER — Other Ambulatory Visit: Payer: Self-pay | Admitting: Family Medicine

## 2022-08-12 DIAGNOSIS — R748 Abnormal levels of other serum enzymes: Secondary | ICD-10-CM

## 2022-08-12 NOTE — Telephone Encounter (Signed)
Lab and ECHO discussed. Unfortunately, his CBC was not done although ordered. Lab appointment scheduled to redo it.  Add on Hepatitis panel given his persistently elevated alkaline phos. He also wants to go ahead and obtain Liver US which was ordered.

## 2022-08-13 NOTE — Progress Notes (Signed)
Spoke with patient. Informed of Ultra sound Aug 5th at 9:00a. Patient is to fast 8hrs prior to appt time. No food no water. Patient understood.Joseph Tyler, CMA

## 2022-08-16 ENCOUNTER — Other Ambulatory Visit (HOSPITAL_COMMUNITY): Payer: Managed Care, Other (non HMO)

## 2022-08-17 ENCOUNTER — Other Ambulatory Visit: Payer: Managed Care, Other (non HMO)

## 2022-08-17 DIAGNOSIS — R5383 Other fatigue: Secondary | ICD-10-CM

## 2022-08-17 DIAGNOSIS — R748 Abnormal levels of other serum enzymes: Secondary | ICD-10-CM

## 2022-08-23 ENCOUNTER — Ambulatory Visit (HOSPITAL_COMMUNITY): Admission: RE | Admit: 2022-08-23 | Payer: Managed Care, Other (non HMO) | Source: Ambulatory Visit

## 2022-08-30 ENCOUNTER — Ambulatory Visit (INDEPENDENT_AMBULATORY_CARE_PROVIDER_SITE_OTHER): Payer: Managed Care, Other (non HMO) | Admitting: Podiatry

## 2022-08-30 VITALS — BP 159/94 | HR 62 | Ht 67.0 in | Wt 152.0 lb

## 2022-08-30 DIAGNOSIS — M8700 Idiopathic aseptic necrosis of unspecified bone: Secondary | ICD-10-CM | POA: Diagnosis not present

## 2022-08-30 DIAGNOSIS — M7661 Achilles tendinitis, right leg: Secondary | ICD-10-CM | POA: Diagnosis not present

## 2022-08-30 NOTE — Progress Notes (Signed)
Subjective: Chief Complaint  Patient presents with   Foot Pain    Has continued pain right achilles tendon area states therapy does not help wants to change from Meloxicam to Ibuprofen     62 year old male presents to the office with above concerns.  States he still gets discomfort on the right Achilles tendon.  No recent injury or changes to the side.  He is followed with Dr. Lorin Picket for the AVN of the left side.  He is contemplating surgery for this.  He is asking for a right foot knee surgery and if so he like to have surgery on both.  The same time.  Objective: AAO x3, NAD DP/PT pulses palpable bilaterally, CRT less than 3 seconds There is continuation of tenderness on the medial aspect of the patient's left foot on the medial cuneiform, medial aspect.  There is minimal edema there is no erythema or warmth.  This appears to be unchanged.  On the right side there is tenderness palpation along the insertion of the Achilles tendon along the distal portion of the Achilles tendon.  Clinically the tendon appears to be intact.  No edema, erythema to this area.  No pain with calf compression, swelling, warmth, erythema  Assessment: Left foot avascular necrosis; right Achilles tendonesis   Plan: -All treatment options discussed with the patient including all alternatives, risks, complications.  -Will defer to Dr. Lorin Picket for AVN left foot. -On the right side he was recently just switched ibuprofen he will continue this but hold other anti-inflammatories.  Discussed stretching, icing regularly.  Dispensed gel Achilles sleeve.  Discussed shoes, good arch support.  Consider physical therapy.  Would recommend holding off on surgery for now.  Discussed that if he ultimately needs to have surgery on the right foot I would not recommend doing this at the same time as his left foot given and recovering on both sides.  Vivi Barrack DPM

## 2022-08-30 NOTE — Patient Instructions (Signed)

## 2022-08-31 ENCOUNTER — Telehealth: Payer: Self-pay | Admitting: Family Medicine

## 2022-08-31 ENCOUNTER — Ambulatory Visit (INDEPENDENT_AMBULATORY_CARE_PROVIDER_SITE_OTHER): Payer: Managed Care, Other (non HMO) | Admitting: Family Medicine

## 2022-08-31 ENCOUNTER — Encounter: Payer: Self-pay | Admitting: Family Medicine

## 2022-08-31 VITALS — BP 173/106 | HR 55 | Ht 67.0 in | Wt 148.4 lb

## 2022-08-31 DIAGNOSIS — R7303 Prediabetes: Secondary | ICD-10-CM | POA: Diagnosis not present

## 2022-08-31 DIAGNOSIS — E559 Vitamin D deficiency, unspecified: Secondary | ICD-10-CM | POA: Diagnosis not present

## 2022-08-31 DIAGNOSIS — I10 Essential (primary) hypertension: Secondary | ICD-10-CM | POA: Diagnosis not present

## 2022-08-31 DIAGNOSIS — R634 Abnormal weight loss: Secondary | ICD-10-CM | POA: Insufficient documentation

## 2022-08-31 DIAGNOSIS — M87076 Idiopathic aseptic necrosis of unspecified foot: Secondary | ICD-10-CM | POA: Diagnosis not present

## 2022-08-31 MED ORDER — LISINOPRIL 20 MG PO TABS: 20.0000 mg | ORAL_TABLET | Freq: Every day | ORAL | 2 refills | Status: DC

## 2022-08-31 NOTE — Progress Notes (Signed)
SUBJECTIVE:   CHIEF COMPLAINT / HPI:   Foot pain: Pain remains about the same, with difficulty with prolonged standing and ambulation. She was seen by a podiatrist recently and would like to defer surgery as long as possible. He wanted to clarify if her should use Ibuprofen vs. Mobic for pain as he was recently prescribed Ibuprofen at a recent UC visit. He came in with a disability form to be completed by me due to difficulty at work.  He requested disability specifically for his foot/osteonecrosis and asked not to have a diagnosis of memory impairment on this form. He is ok with me putting his brain MRI result and the indication of the result (i.e risk for memory impairment)  Lab (Vit D & A1C):  Here to f/u with lab results from his recent employee health visit. Per the patient, the employee health provider prescribed him a weekly Vit D supplement.  Weight loss: He is concerned about this. His appetite is great. However, he does not have time to eat due to his work schedule.  HTN: His last dose of Lisinopril 10 mg was this morning. He endorsed compliant with his BP med.  No chest pain or SOB. However, he had a mild headache this morning.  PERTINENT  PMH / PSH: PMHx reviewed  OBJECTIVE:   BP (!) 173/106   Pulse (!) 55   Ht 5\' 7"  (1.702 m)   Wt 148 lb 6.4 oz (67.3 kg)   SpO2 100%   BMI 23.24 kg/m   Physical Exam Vitals and nursing note reviewed.  Constitutional:      Appearance: Normal appearance.  HENT:     Head: Normocephalic and atraumatic.  Eyes:     Extraocular Movements: Extraocular movements intact.     Pupils: Pupils are equal, round, and reactive to light.  Cardiovascular:     Rate and Rhythm: Normal rate and regular rhythm.     Heart sounds: Normal heart sounds. No murmur heard. Pulmonary:     Effort: Pulmonary effort is normal.     Breath sounds: Normal breath sounds. No wheezing.  Neurological:     General: No focal deficit present.     Mental Status: He  is oriented to person, place, and time.     Cranial Nerves: Cranial nerves 2-12 are intact.     Sensory: Sensation is intact.     Motor: Motor function is intact.     Coordination: Coordination is intact.      ASSESSMENT/PLAN:   Avascular necrosis of bone of foot (HCC) A recent visit to podiatry - note completion unavailable for review May alternate Ibuprofen with Mobic discussed F/U for surgery when planned I completed his work disability form  I called him after this visit and advised him that he needed an assessment by a disability specialist to deem him disabled I provided information to call DSS He agreed with the plan  Prediabetes I reviewed and discussed his employee health lab result with him A1C of 5.9 Lifestyle modification discussed F/U in 3-6 months for repeat test  Vitamin D insufficiency Already prescribed high dose Vit D supplement by his employee health physician May start low dose daily supplement once he completes his high dose  Weight loss Up to date with cancer screening As discussed with him, this is intentional weight loss from not finding time to eat I discussed diet and nutrition with him He'll work on this  Essential hypertension, benign Poorly controlled despite compliance Currently asymptomatic and  no neurologic deficit on exam Increase Lisinopril to 20 mg every day Monitor BP closely at home Return for f/u as scheduled or sooner if BP remains elevated  He agreed with the plan     Janit Pagan, MD [facility]

## 2022-08-31 NOTE — Patient Instructions (Addendum)
Abdominal Ultrasound scheduling - Please call  [phone] press option 3 then option 2  Preventing Type 2 Diabetes Mellitus Type 2 diabetes, also called type 2 diabetes mellitus, is a long-term (chronic) disease that affects sugar (glucose) levels in your blood. Normally, a hormone called insulin allows glucose to enter cells in your body. The cells use glucose for energy. With type 2 diabetes, you will have one or both of these problems: Your pancreas does not make enough insulin. Cells in your body do not respond properly to insulin that your body makes (insulin resistance). Insulin resistance or lack of insulin causes extra glucose to build up in the blood instead of going into cells. As a result, high blood glucose (hyperglycemia) develops. That can cause many complications. Being overweight or obese and having an inactive (sedentary) lifestyle can increase your risk for diabetes. Type 2 diabetes can be delayed or prevented by making certain nutrition and lifestyle changes. How can this condition affect me? If you do not take steps to prevent diabetes, your blood glucose levels may keep increasing over time. Too much glucose in your blood for a long time can damage your blood vessels, heart, kidneys, nerves, and eyes. Type 2 diabetes can lead to chronic health problems and complications, such as: Heart disease. Stroke. Blindness. Kidney disease. Depression. Poor circulation in your feet and legs. In severe cases, a foot or leg may need to be surgically removed (amputated). What can increase my risk? You may be more likely to develop type 2 diabetes if you: Have type 2 diabetes in your family. Are overweight or obese. Have a sedentary lifestyle. Have insulin resistance or a history of prediabetes. Have a history of pregnancy-related (gestational) diabetes or polycystic ovary syndrome (PCOS). What actions can I take to prevent this? It can be difficult to recognize signs of type 2  diabetes. Taking action to prevent the disease before you develop symptoms is the best way to avoid possible damage to your body. Making certain nutrition and lifestyle changes may prevent or delay the disease and related health problems. Nutrition  Eat healthy meals and snacks regularly. Do not skip meals. Fruit or a handful of nuts is a healthy snack between meals. Drink water throughout the day. Avoid drinks that contain added sugar, such as soda or sweetened tea. Drink enough fluid to keep your urine pale yellow. Follow instructions from your health care provider about eating or drinking restrictions. Limit the amount of food you eat by: Managing how much you eat at a time (portion size). Checking food labels for the serving sizes of food. Using a kitchen scale to weigh amounts of food. Saut or steam food instead of frying it. Cook with water or broth instead of oils or butter. Limit saturated fat and salt (sodium) in your diet. Have no more than 1 tsp (2,400 mg) of sodium a day. If you have heart disease or high blood pressure, use less than ? tsp (1,500 mg) of sodium a day. Lifestyle  Lose weight if needed and as told. Your health care provider can determine how much weight loss is best for you and can help you lose weight safely. If you are overweight or obese, you may be told to lose at least 5?7% of your body weight. Manage blood pressure, cholesterol, and stress. Your health care provider will help determine the best treatment for you. Do not use any products that contain nicotine or tobacco. These products include cigarettes, chewing tobacco, and vaping devices, such as  e-cigarettes. If you need help quitting, ask your health care provider. Activity  Do physical activity that makes your heart beat faster and makes you sweat (moderate intensity). Do this for at least 30 minutes on at least 5 days of the week, or as much as told by your health care provider. Ask your health care  provider what activities are safe for you. A mix of activities may be best, such as walking, swimming, cycling, and strength training. Try to add physical activity into your day. For example: Park your car farther away than usual so that you walk more. Take a walk during your lunch break. Use stairs instead of elevators or escalators. Walk or bike to work instead of driving. Alcohol use If you drink alcohol: Limit how much you have to: 0?1 drink a day for women who are not pregnant. 0?2 drinks a day for men. Know how much alcohol is in your drink. In the [place] one drink equals one 12 oz bottle of beer (355 mL), one 5 oz glass of wine (148 mL), or one 1 oz glass of hard liquor (44 mL). General information Talk with your health care provider about your risk factors and how you can reduce your risk for diabetes. Have your blood glucose tested regularly, as told by your health care provider. Get screening tests as told by your health care provider. You may have these regularly, especially if you have certain risk factors for type 2 diabetes. Make an appointment with a registered dietitian. This diet and nutrition specialist can help you make a healthy eating plan and help you understand portion sizes and food labels. Where to find support Ask your health care provider to recommend a registered dietitian, a certified diabetes care and education specialist, or a weight loss program. Look for local or online weight loss groups. Join a gym, fitness club, or outdoor activity group, such as a walking club. Where to find more information For help and guidance and to learn more about diabetes and diabetes prevention, visit: American Diabetes Association (ADA): www.diabetes.[employer] of Diabetes and Digestive and Kidney Diseases: CarFlippers.tn To learn more about healthy eating, visit: U.S. Department of Agriculture Architect): https://ball-collins.biz/ Office of Disease Prevention and Health  Promotion (ODPHP): ResearchName.uy Summary You can delay or prevent type 2 diabetes by eating healthy foods, losing weight if needed, and increasing your physical activity. Talk with your health care provider about your risk factors for type 2 diabetes and how you can reduce your risk. It can be difficult to recognize the signs of type 2 diabetes. The best way to avoid possible damage to your body is to take action to prevent the disease before you develop symptoms. Get screening tests as told by your health care provider. This information is not intended to replace advice given to you by your health care provider. Make sure you discuss any questions you have with your health care provider. Document Revised: 03/31/2020 Document Reviewed: 03/31/2020 Elsevier Patient Education  2024 [employer].

## 2022-08-31 NOTE — Assessment & Plan Note (Signed)
I reviewed and discussed his employee health lab result with him A1C of 5.9 Lifestyle modification discussed F/U in 3-6 months for repeat test

## 2022-08-31 NOTE — Assessment & Plan Note (Signed)
Poorly controlled despite compliance Currently asymptomatic and no neurologic deficit on exam Increase Lisinopril to 20 mg every day Monitor BP closely at home Return for f/u as scheduled or sooner if BP remains elevated  He agreed with the plan

## 2022-08-31 NOTE — Assessment & Plan Note (Deleted)
A recent visit to podiatry - note completion unavailable for review May alternate Ibuprofen with Mobic discussed F/U for surgery when planned I completed his work disability form  I called him after this visit and advised him that he needed an assessment by a disability specialist to deem him disabled I provided information to call DSS He agreed with the plan

## 2022-08-31 NOTE — Assessment & Plan Note (Signed)
A recent visit to podiatry - note completion unavailable for review May alternate Ibuprofen with Mobic discussed F/U for surgery when planned I completed his work disability form  I called him after this visit and advised him that he needed an assessment by a disability specialist to deem him disabled I provided information to call DSS He agreed with the plan

## 2022-08-31 NOTE — Telephone Encounter (Signed)
Form completed. I have advised him that he needed to be evaluated by a disability specialist to be deemed disabled DSS info provided I have made a copy of the completed form and placed in the medical record box to attach medication list.

## 2022-08-31 NOTE — Assessment & Plan Note (Signed)
Already prescribed high dose Vit D supplement by his employee health physician May start low dose daily supplement once he completes his high dose

## 2022-08-31 NOTE — Assessment & Plan Note (Signed)
Up to date with cancer screening As discussed with him, this is intentional weight loss from not finding time to eat I discussed diet and nutrition with him He'll work on this

## 2022-09-12 ENCOUNTER — Other Ambulatory Visit: Payer: Self-pay | Admitting: Family Medicine

## 2022-10-17 NOTE — Progress Notes (Deleted)
Cardiology Office Note:    Date:  10/17/2022   ID:  Joseph Tyler, DOB 1960/02/11, MRN [id number]  PCP:  Doreene Eland, MD   [facility] Providers Cardiologist:  None { Click to update primary MD,subspecialty MD or APP then REFRESH:1}    Referring MD: Doreene Eland, MD   No chief complaint on file. ***  History of Present Illness:    Joseph Tyler is a 62 y.o. male with a hx of HTN, referral for CP   Echo 07/2022 shows LVEF 55-60%; unremarkable.  Past Medical History:  Diagnosis Date   Abdominal pain, chronic, right upper quadrant 03/06/2018   Back pain    HTN (hypertension)    on and off since 2002   Hyperlipidemia    Kidney stone    Memory changes     Past Surgical History:  Procedure Laterality Date   No past surgery      Current Medications: No outpatient medications have been marked as taking for the 10/18/22 encounter (Appointment) with Maisie Fus, MD.     Allergies:   Other   Social History   Socioeconomic History   Marital status: Married    Spouse name: Not on file   Number of children: 4   Years of education: some college   Highest education level: Not on file  Occupational History   Occupation: Nurse, learning disability  Tobacco Use   Smoking status: Never   Smokeless tobacco: Never  Vaping Use   Vaping status: Never Used  Substance and Sexual Activity   Alcohol use: No   Drug use: No   Sexual activity: Yes    Partners: Female  Other Topics Concern   Not on file  Social History Narrative   Lives at home with family. Married, 4 children   Works for a Sales executive; has 2 jobs.    Daily caffeine use - 4/day    Right-handed.   Social Determinants of Health   Financial Resource Strain: Low Risk  (05/19/2018)   Overall Financial Resource Strain (CARDIA)    Difficulty of Paying Living Expenses: Not hard at all  Food Insecurity: No Food Insecurity (05/19/2018)   Hunger Vital Sign     Worried About Running Out of Food in the Last Year: Never true    Ran Out of Food in the Last Year: Never true  Transportation Needs: No Transportation Needs (05/19/2018)   PRAPARE - Administrator, Civil Service (Medical): No    Lack of Transportation (Non-Medical): No  Physical Activity: Inactive (05/19/2018)   Exercise Vital Sign    Days of Exercise per Week: 0 days    Minutes of Exercise per Session: 0 min  Stress: No Stress Concern Present (05/19/2018)   [employer] of Occupational Health - Occupational Stress Questionnaire    Feeling of Stress : Not at all  Social Connections: Moderately Integrated (05/19/2018)   Social Connection and Isolation Panel [NHANES]    Frequency of Communication with Friends and Family: Three times a week    Frequency of Social Gatherings with Friends and Family: Three times a week    Attends Religious Services: 1 to 4 times per year    Active Member of Clubs or Organizations: No    Attends Banker Meetings: Never    Marital Status: Married     Family History: The patient's ***family history includes Heart disease in his mother; Hypertension in his father; Other in his  father; Sickle cell anemia in his sister. There is no history of Colon cancer, Esophageal cancer, Inflammatory bowel disease, Liver disease, Pancreatic cancer, Rectal cancer, or Stomach cancer.  ROS:   Please see the history of present illness.    *** All other systems reviewed and are negative.  EKGs/Labs/Other Studies Reviewed:    The following studies were reviewed today: ***      Recent Labs: 08/10/2022: ALT 7; BUN 15; Creatinine, Ser 1.02; Potassium 4.5; Sodium 139; TSH 1.480 08/17/2022: Hemoglobin 12.4; Platelets 234  Recent Lipid Panel    Component Value Date/Time   CHOL 146 10/16/2021 1139   TRIG 43 10/16/2021 1139   HDL 50 10/16/2021 1139   CHOLHDL 2.9 10/16/2021 1139   CHOLHDL 3.0 05/01/2014 1048   VLDL 9 05/01/2014 1048   LDLCALC 86  10/16/2021 1139     Risk Assessment/Calculations:   {Does this patient have ATRIAL FIBRILLATION?:[phone]}  No BP recorded.  {Refresh Note OR Click here to enter BP  :1}***         Physical Exam:    VS:  There were no vitals taken for this visit.    Wt Readings from Last 3 Encounters:  08/31/22 148 lb 6.4 oz (67.3 kg)  08/30/22 152 lb (68.9 kg)  08/10/22 152 lb 6 oz (69.1 kg)     GEN: *** Well nourished, well developed in no acute distress HEENT: Normal NECK: No JVD; No carotid bruits LYMPHATICS: No lymphadenopathy CARDIAC: ***RRR, no murmurs, rubs, gallops RESPIRATORY:  Clear to auscultation without rales, wheezing or rhonchi  ABDOMEN: Soft, non-tender, non-distended MUSCULOSKELETAL:  No edema; No deformity  SKIN: Warm and dry NEUROLOGIC:  Alert and oriented x 3 PSYCHIATRIC:  Normal affect   ASSESSMENT:    No diagnosis found. PLAN:    In order of problems listed above:  ***      {Are you ordering a CV Procedure (e.g. stress test, cath, DCCV, TEE, etc)?   Press F2        :[id number]}    Medication Adjustments/Labs and Tests Ordered: Current medicines are reviewed at length with the patient today.  Concerns regarding medicines are outlined above.  No orders of the defined types were placed in this encounter.  No orders of the defined types were placed in this encounter.   There are no Patient Instructions on file for this visit.   Signed, Maisie Fus, MD  10/17/2022 5:31 PM    [facility]

## 2022-10-18 ENCOUNTER — Ambulatory Visit: Payer: Managed Care, Other (non HMO) | Attending: Internal Medicine | Admitting: Internal Medicine

## 2022-10-30 ENCOUNTER — Other Ambulatory Visit: Payer: Self-pay | Admitting: Family Medicine

## 2022-10-30 DIAGNOSIS — I10 Essential (primary) hypertension: Secondary | ICD-10-CM

## 2022-11-05 ENCOUNTER — Ambulatory Visit (INDEPENDENT_AMBULATORY_CARE_PROVIDER_SITE_OTHER): Payer: Managed Care, Other (non HMO) | Admitting: Family Medicine

## 2022-11-05 ENCOUNTER — Encounter: Payer: Self-pay | Admitting: Family Medicine

## 2022-11-05 VITALS — BP 124/80 | HR 80 | Ht 67.0 in | Wt 156.4 lb

## 2022-11-05 DIAGNOSIS — E785 Hyperlipidemia, unspecified: Secondary | ICD-10-CM

## 2022-11-05 DIAGNOSIS — R4189 Other symptoms and signs involving cognitive functions and awareness: Secondary | ICD-10-CM | POA: Diagnosis not present

## 2022-11-05 DIAGNOSIS — F01B18 Vascular dementia, moderate, with other behavioral disturbance: Secondary | ICD-10-CM

## 2022-11-05 DIAGNOSIS — A530 Latent syphilis, unspecified as early or late: Secondary | ICD-10-CM

## 2022-11-05 DIAGNOSIS — R252 Cramp and spasm: Secondary | ICD-10-CM | POA: Diagnosis not present

## 2022-11-05 DIAGNOSIS — M87076 Idiopathic aseptic necrosis of unspecified foot: Secondary | ICD-10-CM

## 2022-11-05 DIAGNOSIS — I10 Essential (primary) hypertension: Secondary | ICD-10-CM

## 2022-11-05 MED ORDER — IBUPROFEN 800 MG PO TABS: 800.0000 mg | ORAL_TABLET | Freq: Every day | ORAL | 0 refills | Status: DC | PRN

## 2022-11-05 NOTE — Assessment & Plan Note (Signed)
BP looks great. Continue his current dose of Lisinopril.

## 2022-11-05 NOTE — Patient Instructions (Signed)
Managing Your Hypertension Hypertension, also called high blood pressure, is when the force of the blood pressing against the walls of the arteries is too strong. Arteries are blood vessels that carry blood from your heart throughout your body. Hypertension forces the heart to work harder to pump blood and may cause the arteries to become narrow or stiff. Understanding blood pressure readings A blood pressure reading includes a higher number over a lower number: The first, or top, number is called the systolic pressure. It is a measure of the pressure in your arteries as your heart beats. The second, or bottom number, is called the diastolic pressure. It is a measure of the pressure in your arteries as the heart relaxes. For most people, a normal blood pressure is below 120/80. Your personal target blood pressure may vary depending on your medical conditions, your age, and other factors. Blood pressure is classified into four stages. Based on your blood pressure reading, your health care provider may use the following stages to determine what type of treatment you need, if any. Systolic pressure and diastolic pressure are measured in a unit called millimeters of mercury (mmHg). Normal Systolic pressure: below 120. Diastolic pressure: below 80. Elevated Systolic pressure: 120-129. Diastolic pressure: below 80. Hypertension stage 1 Systolic pressure: 130-139. Diastolic pressure: 80-89. Hypertension stage 2 Systolic pressure: 140 or above. Diastolic pressure: 90 or above. How can this condition affect me? Managing your hypertension is very important. Over time, hypertension can damage the arteries and decrease blood flow to parts of the body, including the brain, heart, and kidneys. Having untreated or uncontrolled hypertension can lead to: A heart attack. A stroke. A weakened blood vessel (aneurysm). Heart failure. Kidney damage. Eye damage. Memory and concentration problems. Vascular  dementia. What actions can I take to manage this condition? Hypertension can be managed by making lifestyle changes and possibly by taking medicines. Your health care provider will help you make a plan to bring your blood pressure within a normal range. You may be referred for counseling on a healthy diet and physical activity. Nutrition  Eat a diet that is high in fiber and potassium, and low in salt (sodium), added sugar, and fat. An example eating plan is called the DASH diet. DASH stands for Dietary Approaches to Stop Hypertension. To eat this way: Eat plenty of fresh fruits and vegetables. Try to fill one-half of your plate at each meal with fruits and vegetables. Eat whole grains, such as whole-wheat pasta, brown rice, or whole-grain bread. Fill about one-fourth of your plate with whole grains. Eat low-fat dairy products. Avoid fatty cuts of meat, processed or cured meats, and poultry with skin. Fill about one-fourth of your plate with lean proteins such as fish, chicken without skin, beans, eggs, and tofu. Avoid pre-made and processed foods. These tend to be higher in sodium, added sugar, and fat. Reduce your daily sodium intake. Many people with hypertension should eat less than 1,500 mg of sodium a day. Lifestyle  Work with your health care provider to maintain a healthy body weight or to lose weight. Ask what an ideal weight is for you. Get at least 30 minutes of exercise that causes your heart to beat faster (aerobic exercise) most days of the week. Activities may include walking, swimming, or biking. Include exercise to strengthen your muscles (resistance exercise), such as weight lifting, as part of your weekly exercise routine. Try to do these types of exercises for 30 minutes at least 3 days a week. Do   not use any products that contain nicotine or tobacco. These products include cigarettes, chewing tobacco, and vaping devices, such as e-cigarettes. If you need help quitting, ask your  health care provider. Control any long-term (chronic) conditions you have, such as high cholesterol or diabetes. Identify your sources of stress and find ways to manage stress. This may include meditation, deep breathing, or making time for fun activities. Alcohol use Do not drink alcohol if: Your health care provider tells you not to drink. You are pregnant, may be pregnant, or are planning to become pregnant. If you drink alcohol: Limit how much you have to: 0-1 drink a day for women. 0-2 drinks a day for men. Know how much alcohol is in your drink. In the [place] one drink equals one 12 oz bottle of beer (355 mL), one 5 oz glass of wine (148 mL), or one 1 oz glass of hard liquor (44 mL). Medicines Your health care provider may prescribe medicine if lifestyle changes are not enough to get your blood pressure under control and if: Your systolic blood pressure is 130 or higher. Your diastolic blood pressure is 80 or higher. Take medicines only as told by your health care provider. Follow the directions carefully. Blood pressure medicines must be taken as told by your health care provider. The medicine does not work as well when you skip doses. Skipping doses also puts you at risk for problems. Monitoring Before you monitor your blood pressure: Do not smoke, drink caffeinated beverages, or exercise within 30 minutes before taking a measurement. Use the bathroom and empty your bladder (urinate). Sit quietly for at least 5 minutes before taking measurements. Monitor your blood pressure at home as told by your health care provider. To do this: Sit with your back straight and supported. Place your feet flat on the floor. Do not cross your legs. Support your arm on a flat surface, such as a table. Make sure your upper arm is at heart level. Each time you measure, take two or three readings one minute apart and record the results. You may also need to have your blood pressure checked regularly by  your health care provider. General information Talk with your health care provider about your diet, exercise habits, and other lifestyle factors that may be contributing to hypertension. Review all the medicines you take with your health care provider because there may be side effects or interactions. Keep all follow-up visits. Your health care provider can help you create and adjust your plan for managing your high blood pressure. Where to find more information National Heart, Lung, and Blood Institute: www.nhlbi.nih.gov American Heart Association: www.heart.org Contact a health care provider if: You think you are having a reaction to medicines you have taken. You have repeated (recurrent) headaches. You feel dizzy. You have swelling in your ankles. You have trouble with your vision. Get help right away if: You develop a severe headache or confusion. You have unusual weakness or numbness, or you feel faint. You have severe pain in your chest or abdomen. You vomit repeatedly. You have trouble breathing. These symptoms may be an emergency. Get help right away. Call 911. Do not wait to see if the symptoms will go away. Do not drive yourself to the hospital. Summary Hypertension is when the force of blood pumping through your arteries is too strong. If this condition is not controlled, it may put you at risk for serious complications. Your personal target blood pressure may vary depending on your medical conditions,   your age, and other factors. For most people, a normal blood pressure is less than 120/80. Hypertension is managed by lifestyle changes, medicines, or both. Lifestyle changes to help manage hypertension include losing weight, eating a healthy, low-sodium diet, exercising more, stopping smoking, and limiting alcohol. This information is not intended to replace advice given to you by your health care provider. Make sure you discuss any questions you have with your health care  provider. Document Revised: 09/18/2020 Document Reviewed: 09/18/2020 Elsevier Patient Education  2024 Elsevier Inc.  

## 2022-11-05 NOTE — Assessment & Plan Note (Addendum)
Ibuprofen refilled However, I reduced his frequency of use to reduce his risk for gastritis He agreed with the plan F/U closely with podiatry Mg checked for muscle cramps

## 2022-11-05 NOTE — Progress Notes (Signed)
    SUBJECTIVE:   CHIEF COMPLAINT / HPI:   Latent Syphilis: Here for repeat lab.  HTN: He is now on Lisinopril 20 mg QD. He is here for f/u.  Muscle cramps/Foot pain: He has occasional muscle cramps and uses Ibuprofen 800 mg TID prn for his foot pain, which has greatly improved his condition. He requested a refill of Ibuprofen as he does not use his Morbic or Naproxen.   Memory Issues:  He was recently seen by his Psychiatrist and was advised to check his vitamin levels, as low levels can impair his memory. He requested a lab test today.  PERTINENT  PMH / PSH: PMHx reviewed  OBJECTIVE:   BP 124/80   Pulse 80   Ht 5\' 7"  (1.702 m)   Wt 156 lb 6.4 oz (70.9 kg)   SpO2 99%   BMI 24.50 kg/m   Physical Exam Vitals and nursing note reviewed.  Cardiovascular:     Rate and Rhythm: Normal rate and regular rhythm.     Heart sounds: Normal heart sounds. No murmur heard. Pulmonary:     Effort: Pulmonary effort is normal. No respiratory distress.     Breath sounds: Normal breath sounds. No wheezing.      ASSESSMENT/PLAN:   Essential hypertension, benign BP looks great. Continue his current dose of Lisinopril.  Syphilis, latent S/P treatment Wishes to recheck his RPR  No other concerns Lab checked  Avascular necrosis of bone of foot (HCC) Ibuprofen refilled However, I reduced his frequency of use to reduce his risk for gastritis He agreed with the plan F/U closely with podiatry Mg checked for muscle cramps  Vascular dementia (HCC) No acute change from his baseline Vit B12 and D checked I will contact him soon with his test results     Janit Pagan, MD [facility]

## 2022-11-05 NOTE — Assessment & Plan Note (Signed)
S/P treatment Wishes to recheck his RPR  No other concerns Lab checked

## 2022-11-05 NOTE — Assessment & Plan Note (Signed)
No acute change from his baseline Vit B12 and D checked I will contact him soon with his test results

## 2022-11-06 ENCOUNTER — Telehealth: Payer: Self-pay | Admitting: Family Medicine

## 2022-11-06 DIAGNOSIS — A530 Latent syphilis, unspecified as early or late: Secondary | ICD-10-CM

## 2022-11-06 NOTE — Telephone Encounter (Signed)
Test results discussed.  RPR is positive discussed. I doubt he needs additional treatment. However, I recommended second opinion from ID given his neurologic impairment. I discussed his case with ID in the patient and they recommended f/u but he declined referral then. He agreed with referral now. His insurance ends in Dec and want to be referred before then.  Joseph Tyler

## 2022-11-07 NOTE — Telephone Encounter (Signed)
Placed in [facility] workqueue. Jone Baseman, CMA

## 2022-11-09 LAB — LIPID PANEL
Chol/HDL Ratio: 2.7 {ratio} (ref 0.0–5.0)
Cholesterol, Total: 121 mg/dL (ref 100–199)
HDL: 45 mg/dL (ref >39)
LDL Chol Calc (NIH): 63 mg/dL (ref 0–99)
Triglycerides: 59 mg/dL (ref 0–149)
VLDL Cholesterol Cal: 13 mg/dL (ref 5–40)

## 2022-11-09 LAB — RPR: RPR Ser Ql: REACTIVE — AB

## 2022-11-09 LAB — MAGNESIUM: Magnesium: 1.7 mg/dL (ref 1.6–2.3)

## 2022-11-09 LAB — VITAMIN D 25 HYDROXY (VIT D DEFICIENCY, FRACTURES): Vit D, 25-Hydroxy: 41.7 ng/mL (ref 30.0–100.0)

## 2022-11-09 LAB — RPR, QUANT+TP ABS (REFLEX): Rapid Plasma Reagin, Quant: 1:2 {titer} — ABNORMAL HIGH

## 2022-11-09 LAB — VITAMIN B12: Vitamin B-12: 320 pg/mL (ref 232–1245)

## 2022-11-15 ENCOUNTER — Telehealth: Payer: Self-pay
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
## 2022-11-15 NOTE — Telephone Encounter (Signed)
Spoke with Joseph Tyler at [facility], they have no additional RPR history and no treatment history on file. Case was closed due to patient's age.   Per chart review, he was treated with Bicillin 2.4 MU IM x 3 on 11/02/21, 11/09/21, and 11/17/21.  Sandie Ano, RN

## 2022-11-16 ENCOUNTER — Ambulatory Visit (INDEPENDENT_AMBULATORY_CARE_PROVIDER_SITE_OTHER): Payer: Managed Care, Other (non HMO) | Admitting: Infectious Diseases

## 2022-11-16 ENCOUNTER — Other Ambulatory Visit: Payer: Self-pay

## 2022-11-16 ENCOUNTER — Encounter: Payer: Self-pay | Admitting: Infectious Diseases

## 2022-11-16 VITALS — BP 130/78 | HR 68 | Temp 98.2°F | Ht 67.0 in | Wt 155.0 lb

## 2022-11-16 DIAGNOSIS — A530 Latent syphilis, unspecified as early or late: Secondary | ICD-10-CM | POA: Diagnosis not present

## 2022-11-16 DIAGNOSIS — R413 Other amnesia: Secondary | ICD-10-CM | POA: Diagnosis not present

## 2022-11-17 DIAGNOSIS — R413 Other amnesia: Secondary | ICD-10-CM | POA: Insufficient documentation

## 2022-11-19 ENCOUNTER — Telehealth: Payer: Self-pay | Admitting: Family Medicine

## 2022-11-19 ENCOUNTER — Encounter: Payer: Self-pay | Admitting: Family Medicine

## 2022-11-19 ENCOUNTER — Ambulatory Visit (INDEPENDENT_AMBULATORY_CARE_PROVIDER_SITE_OTHER): Payer: Managed Care, Other (non HMO) | Admitting: Family Medicine

## 2022-11-19 VITALS — BP 115/71 | HR 68 | Ht 67.0 in | Wt 152.6 lb

## 2022-11-19 DIAGNOSIS — M7661 Achilles tendinitis, right leg: Secondary | ICD-10-CM | POA: Diagnosis not present

## 2022-11-19 DIAGNOSIS — R339 Retention of urine, unspecified: Secondary | ICD-10-CM

## 2022-11-19 DIAGNOSIS — I6782 Cerebral ischemia: Secondary | ICD-10-CM

## 2022-11-19 DIAGNOSIS — L608 Other nail disorders: Secondary | ICD-10-CM

## 2022-11-19 DIAGNOSIS — M87076 Idiopathic aseptic necrosis of unspecified foot: Secondary | ICD-10-CM

## 2022-11-19 MED ORDER — TAMSULOSIN HCL 0.4 MG PO CAPS: 0.8000 mg | ORAL_CAPSULE | Freq: Every day | ORAL | 1 refills | Status: DC

## 2022-11-19 NOTE — Assessment & Plan Note (Signed)
Of his left foot Progressively worsening especially with prolonged standing He will benefit from a lighter duty job with frequent breaks He will submit his FMLA form to be updated Continue Ibuprofen prn pain and f/u with podiatry for further evaluation He agreed with his plan

## 2022-11-19 NOTE — Assessment & Plan Note (Addendum)
Since this is growing out, it is less likely malignant and more likely traumatic Monitor for now

## 2022-11-19 NOTE — Assessment & Plan Note (Signed)
I refilled his Flomax Referred to Urology again for assessment

## 2022-11-19 NOTE — Patient Instructions (Signed)
Osteonecrosis  Osteonecrosis is death of bone tissue due to lack of blood supply. Without proper blood supply, the internal layer of the affected bone dies. Over time, small breaks form in the outer layer of the bone. If this process affects a bone near a joint, the joint may collapse or move out of place (dislocation). Joints that may be affected include the jaw, wrist, fingers, knee, and foot. Osteonecrosis most commonly affects: The hip joint, especially the top of the thigh bone (femoral head). The top of the upper arm bone (humeral head). You may hear other names for this condition. It can be called avascular necrosis, aseptic necrosis, or ischemic bone necrosis. What are the causes? This condition may be caused by: Damage or injury to a bone or joint. Using steroid medicine, such as prednisone, for a long time. Changes in the body's disease-fighting system (immune system). Changes in the body's hormones. Hormones are chemicals that regulate body processes. Exposure to a lot of radiation. What increases the risk? The following factors may make you more likely to develop this condition: Alcohol abuse. A history of joint injury. Long-term or frequent use of steroid medicines. Having a medical condition such as: HIV or AIDS. Diabetes. Sickle cell disease. Autoimmune disease. This is a disease in which the immune system attacks healthy tissues. What are the signs or symptoms? The main symptoms of this condition are: Pain. If an affected joint collapses, the pain may suddenly get severe. Decreased ability to move the affected bone or joint. How is this diagnosed? This condition may be diagnosed based on: Your symptoms and medical history. A physical exam. Imaging tests, such as X-rays, bone scans, and an MRI. How is this treated? Treatment for this condition may include: Pain medicine, such as NSAIDs. Medicine to improve bone growth. Avoiding placing any pressure or weight on the  affected area. If osteonecrosis occurs in your hip, ankle, or foot, you may need to use a device to help you move around. Devices for use include crutches or a rolling scooter. Physical therapy to help regain strength and motion in the affected area. Surgery, such as: Core decompression. One or more holes are placed in the bone for new blood vessels to grow into. This provides a renewed blood supply to the bone. This surgery may reduce pain and pressure in the affected bone and slow the destruction of bones and joints. Osteotomy. The bone is reshaped to reduce stress on the affected area of the joint. Bone grafting. Healthy bone from a different part of your body is used to replace damaged bone. Total joint replacement (arthroplasty). The affected surfaces of a bone are replaced with artificial parts (prostheses). Electrical stimulation (also called e-stim). During this procedure, a probe over the skin sends shock waves into the body. This may help to encourage new bone growth. High-pressure oxygen therapy (hyperbaric oxygen). This is rarely used. Follow these instructions at home: Activity If physical therapy was prescribed, do exercises as told by your health care provider. Ask your health care provider if the medicine prescribed to you requires you to avoid driving or using machinery. Do not use the injured limb to support your body weight until your health care provider says that you can. Use crutches or a rolling scooter. Return to your regular activities as told by your doctor. Ask your health care provider what activities are safe for you. Managing pain, stiffness, and swelling If directed, apply heat to the affected area as often as told by your  health care provider. Heat can reduce the stiffness of your muscles and joints. Use the heat source that your health care provider recommends, such as a moist heat pack or a heating pad. To do this: Place a towel between your skin and the heat  source. Leave the heat on for 20-30 minutes. Remove the heat if your skin turns bright red. This is especially important if you are unable to feel pain, heat, or cold. You may have a greater risk of getting burned. If directed, put ice on the affected area. Icing can help to relieve joint pain and swelling. To do this: Put ice in a plastic bag. Place a towel between your skin and the bag. Leave the ice on for 20 minutes, 2-3 times a day. General instructions  Take over-the-counter and prescription medicines only as told by your health care provider. If a bone in your arm or leg is affected, ask your health care provider if it is safe for you to drive. Do not use any products that contain nicotine or tobacco, such as cigarettes, e-cigarettes, and chewing tobacco. These can delay bone healing. If you need help quitting, ask your health care provider. Do not drink alcohol. Keep all follow-up visits as told by your health care provider. This is important. Where to find more information [employer] of Arthritis and Musculoskeletal and Skin Diseases: www.niams.http://www.myers.net/ Contact a health care provider if: You have pain that gets worse or does not get better with medicine. You have more difficulty moving your joint. Get help right away if: Your pain suddenly becomes severe. Summary Osteonecrosis, also called avascular necrosis, is death of bone tissue due to a lack of blood supply. Without proper blood supply, the internal layer of the affected bone dies. Over time, small breaks form in the outer layer of the bone. Causes of this condition include injury to the bone, use of certain medicines for a long time, or having an autoimmune disease. This condition is treated with medicine, physical therapy, and surgery. You may need to use heat or ice therapy at home. Avoid putting any pressure or weight on the affected area. You may need to use a rolling scooter or crutches to move around. Let your  health care provider know if you have pain that does not get better. Get help right away if you have sudden, severe pain. This information is not intended to replace advice given to you by your health care provider. Make sure you discuss any questions you have with your health care provider. Document Revised: 03/28/2019 Document Reviewed: 03/28/2019 Elsevier Patient Education  2024 [employer].

## 2022-11-19 NOTE — Assessment & Plan Note (Signed)
No acute change Right xray reviewed and discussed with him PT did not help him much he says Continue Ibuprofen prn F/U with Podiatry discussed He will call to schedule his f/u appointment

## 2022-11-19 NOTE — Telephone Encounter (Signed)
Patient dropped off FMLA paperwork to be completed. Last DOS was 11/19/22. Placed in [facility] folder

## 2022-11-19 NOTE — Progress Notes (Signed)
    SUBJECTIVE:   CHIEF COMPLAINT / HPI:   Foot pain: C/O B/L foot pain. He has a pain of 7/10 in the severity of the medial aspect of his left foot over the center of his left foot. This is worse with ambulation and prolonged standing. He is unable to stand longer than 30 minutes without experiencing severe pain. He also has pain in his right heel and Achilles. No recent injury. He uses Ibuprofen 800  mg PRN pain.   Nail hyperpigmentation: His left big toenail is dark, and discoloration is growing out. He denies any pain. No new concerns.   Incomplete bladder emptying: He needs a refill of his Flomax and a referral to a urologist.  Cronic cerebral ischemia: Found on his head imaging. On Crestor for this. However, he stated that he is out of his Crestor and would not want to get back on this medication. Trying to cut back on his pill burden.   PERTINENT  PMH / PSH: PMHx reviewed  OBJECTIVE:   BP 115/71   Pulse 68   Ht 5\' 7"  (1.702 m)   Wt 152 lb 9.6 oz (69.2 kg)   SpO2 98%   BMI 23.90 kg/m   Physical Exam Vitals and nursing note reviewed.  Cardiovascular:     Rate and Rhythm: Normal rate and regular rhythm.     Heart sounds: Normal heart sounds. No murmur heard. Pulmonary:     Effort: Pulmonary effort is normal. No respiratory distress.     Breath sounds: Normal breath sounds. No wheezing.  Musculoskeletal:     Comments: Moderate tenderness of the lateral border of his left foot through to his talonavicular joint area. Mild swelling of his left talonavicular joint with no erythema. Tender right Achilles.  Left big toenail hypopigmentation migrated from the lunular to the mid-nail plate.     Previous:     Current 11/19/22:   ASSESSMENT/PLAN:   Achilles tendinitis of right lower extremity No acute change Right xray reviewed and discussed with him PT did not help him much he says Continue Ibuprofen prn F/U with Podiatry discussed He will call to schedule his  f/u appointment  Avascular necrosis of bone of foot (HCC) Of his left foot Progressively worsening especially with prolonged standing He will benefit from a lighter duty job with frequent breaks He will submit his FMLA form to be updated Continue Ibuprofen prn pain and f/u with podiatry for further evaluation He agreed with his plan  Discoloration of nail Since this is growing out, it is less likely malignant and more likely traumatic Monitor for now  Incomplete bladder emptying I refilled his Flomax Referred to Urology again for assessment   Chronic cerebral ischemia Found on imaging He prefers not to be on Statins for now Crestor d/ced Readdress again in the future     Janit Pagan, MD [facility]

## 2022-11-19 NOTE — Assessment & Plan Note (Signed)
Found on imaging He prefers not to be on Statins for now Crestor d/ced Readdress again in the future

## 2022-11-19 NOTE — Telephone Encounter (Signed)
Clinical info completed on FMLA form.  Placed form in PCP's box for completion.    When form is completed, please route note to "RN Team" and place in wall pocket in front office.   Salvatore Marvel, CMA

## 2022-11-22 NOTE — Telephone Encounter (Signed)
Form faxed to provided number, [phone].  Copy made and placed in batch scanning.   Called patient to advise of completion. Patient also requests copy to be made and placed at front desk. Original at front desk for pick up.   Veronda Prude, RN

## 2022-12-06 ENCOUNTER — Telehealth: Payer: Self-pay | Admitting: Family Medicine

## 2022-12-06 NOTE — Telephone Encounter (Signed)
I received his completed FMLA form back that a page is missing. I completed only the number of pages that was provided. I will message the CMA to reach out to him about this.

## 2022-12-06 NOTE — Telephone Encounter (Signed)
Spoke with patient. Informed him that his HR rep has to resend the FMLA forms without any missing pages. Patient understood. I gave him the office fax number [phone]. Aquilla Solian, CMA

## 2022-12-08 NOTE — Progress Notes (Unsigned)
  Cardiology Office Note:  .   Date:  12/08/2022  ID:  Joseph Tyler, DOB [birth year], MRN [id number] PCP: Doreene Eland, MD  [facility] Providers Cardiologist:  None { Click to update primary MD,subspecialty MD or APP then REFRESH:1}   History of Present Illness: .   Joseph Tyler is a 62 y.o. male with history of hypertension, vascular dementia who is being referred for chest pain.  He had an echocardiogram that showed an EF of 55 to 60%, normal RV function, no valve disease.  ROS:  per HPI otherwise negative   Studies Reviewed: .        *** Risk Assessment/Calculations:   {Does this patient have ATRIAL FIBRILLATION?:[phone]} No BP recorded.  {Refresh Note OR Click here to enter BP  :1}***       Physical Exam:   VS:  There were no vitals taken for this visit.   Wt Readings from Last 3 Encounters:  11/19/22 152 lb 9.6 oz (69.2 kg)  11/16/22 155 lb (70.3 kg)  11/05/22 156 lb 6.4 oz (70.9 kg)    GEN: Well nourished, well developed in no acute distress NECK: No JVD; No carotid bruits CARDIAC: ***RRR, no murmurs, rubs, gallops RESPIRATORY:  Clear to auscultation without rales, wheezing or rhonchi  ABDOMEN: Soft, non-tender, non-distended EXTREMITIES:  No edema; No deformity   ASSESSMENT AND PLAN: .   ***    {Are you ordering a CV Procedure (e.g. stress test, cath, DCCV, TEE, etc)?   Press F2        :[id number]}  Dispo: ***  Signed, Maisie Fus, MD

## 2022-12-09 ENCOUNTER — Ambulatory Visit: Payer: Managed Care, Other (non HMO) | Attending: Internal Medicine | Admitting: Internal Medicine

## 2022-12-09 ENCOUNTER — Encounter: Payer: Self-pay | Admitting: Internal Medicine

## 2022-12-09 VITALS — BP 144/80 | HR 63 | Ht 67.0 in | Wt 156.0 lb

## 2022-12-09 DIAGNOSIS — I10 Essential (primary) hypertension: Secondary | ICD-10-CM

## 2022-12-09 NOTE — Patient Instructions (Signed)
Medication Instructions:  No changes  *If you need a refill on your cardiac medications before your next appointment, please call your pharmacy*   Lab Work: None    Testing/Procedures: None    Follow-Up: At [facility], you and your health needs are our priority.  As part of our continuing mission to provide you with exceptional heart care, we have created designated Provider Care Teams.  These Care Teams include your primary Cardiologist (physician) and Advanced Practice Providers (APPs -  Physician Assistants and Nurse Practitioners) who all work together to provide you with the care you need, when you need it.  We recommend signing up for the patient portal called "MyChart".  Sign up information is provided on this After Visit Summary.  MyChart is used to connect with patients for Virtual Visits (Telemedicine).  Patients are able to view lab/test results, encounter notes, upcoming appointments, etc.  Non-urgent messages can be sent to your provider as well.   To learn more about what you can do with MyChart, go to [email or web address].    Your next appointment:   Follow up as needed   Provider:   Dr.Mary Wyline Mood

## 2022-12-09 NOTE — Telephone Encounter (Signed)
Pages 1-4 FMLA form completed and placed in the front office for faxing.

## 2022-12-14 ENCOUNTER — Encounter: Payer: Self-pay | Admitting: Family Medicine

## 2022-12-14 ENCOUNTER — Ambulatory Visit: Payer: Managed Care, Other (non HMO) | Admitting: Family Medicine

## 2022-12-14 VITALS — BP 132/80 | HR 66 | Ht 67.0 in | Wt 155.5 lb

## 2022-12-14 DIAGNOSIS — R7303 Prediabetes: Secondary | ICD-10-CM | POA: Diagnosis not present

## 2022-12-14 DIAGNOSIS — R339 Retention of urine, unspecified: Secondary | ICD-10-CM

## 2022-12-14 DIAGNOSIS — Z0000 Encounter for general adult medical examination without abnormal findings: Secondary | ICD-10-CM | POA: Diagnosis not present

## 2022-12-14 DIAGNOSIS — M792 Neuralgia and neuritis, unspecified: Secondary | ICD-10-CM

## 2022-12-14 DIAGNOSIS — Z87828 Personal history of other (healed) physical injury and trauma: Secondary | ICD-10-CM | POA: Diagnosis not present

## 2022-12-14 DIAGNOSIS — I10 Essential (primary) hypertension: Secondary | ICD-10-CM

## 2022-12-14 LAB — POCT GLYCOSYLATED HEMOGLOBIN (HGB A1C): Hemoglobin A1C: 5.7 % — AB (ref 4.0–5.6)

## 2022-12-14 NOTE — Assessment & Plan Note (Signed)
Urology referral previously placed I gave him the information to call [facility] for an appointment He agreed with the plan

## 2022-12-14 NOTE — Assessment & Plan Note (Signed)
A1C still in the preDM range Lifestyle changes discussed

## 2022-12-14 NOTE — Progress Notes (Signed)
SUBJECTIVE:   Chief compliant/HPI: annual examination  Joseph Tyler is a 62 y.o. who presents today for an annual exam.   In addition he discussed:  Concern for Lyme's dx: He stated that 3 of his co-workers have Lyme disease. 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 He is now concerned he might have Lymes disease despite no symptoms and wishes to be tested for this. He worries his chronic low energy might be related to lymes disease.  BPH: He still has not heard from urology regarding his referral.  Neuropathic pain: He c/o pain shooting up his legs, for which he had seen a neurologist in the past. No back pain. No LL weakness or numbness.  PreDM/HTN: Need follow up  Reviewed and updated history completed.   Review of systems form notable for as in the body of hx.    OBJECTIVE:   BP 132/80   Pulse 66   Ht 5\' 7"  (1.702 m)   Wt 155 lb 8 oz (70.5 kg)   SpO2 99%   BMI 24.35 kg/m   Physical Exam Vitals and nursing note reviewed.  Constitutional:      Appearance: Normal appearance.  Eyes:     Extraocular Movements: Extraocular movements intact.     Conjunctiva/sclera: Conjunctivae normal.     Pupils: Pupils are equal, round, and reactive to light.  Cardiovascular:     Rate and Rhythm: Normal rate and regular rhythm.     Heart sounds: Normal heart sounds. No murmur heard. Pulmonary:     Effort: Pulmonary effort is normal. No respiratory distress.     Breath sounds: Normal breath sounds. No wheezing.  Abdominal:     General: Abdomen is flat. Bowel sounds are normal. There is no distension.     Palpations: Abdomen is soft. There is no mass.     Tenderness: There is no abdominal tenderness.  Musculoskeletal:     Cervical back: Normal and neck supple.     Thoracic back: Normal.     Lumbar back: Normal.     Right lower leg: No edema.     Left lower leg: No edema.  Neurological:     Mental  Status: He is oriented to person, place, and time.     Cranial Nerves: No cranial nerve deficit.     Sensory: No sensory deficit.     Gait: Gait normal.  Psychiatric:        Behavior: Behavior normal.      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  Blood pressure reviewed and at goal. Close to goal   Advanced directive discussed and booklet was given   Considered the following items based upon USPSTF recommendations:  HIV testing:  Done in 2023 Hepatitis C: not indicated Hepatitis B:  Neg in 2024 Syphilis if at high risk: { RPR reactive and treated. No further treatment is indicated GC/CTnot indicated Lipid panel (nonfasting or fasting) discussed based upon AHA recommendations and not ordered.  Consider repeat every 4-6 years.  Reviewed risk factors for latent tuberculosis and not indicated Immunizations Declined flu, covid-19 and Shingrix vaccination. I discussed getting Shingrix at his pharmacy   Follow up in 1 year or sooner if indicated.     Janit Pagan, MD [facility]

## 2022-12-14 NOTE — Patient Instructions (Addendum)
It was nice seeing you today. Your A1C is slightly elevated, suggesting you have prediabetes. Please work on diet control and exercise as discussed.   Also, call [facility] for your referral Address: [address] [place] [place], [place] [address] Phone: [phone]  Zoster Vaccine Injection What is this medication? ZOSTER VACCINE (ZOS ter vak SEEN) reduces the risk of herpes zoster (shingles). It does not treat shingles. It is still possible to get shingles after receiving the vaccine, but the symptoms may be less severe or not last as long. It works by helping your immune system learn how to fight off a future infection. This medicine may be used for other purposes; ask your health care provider or pharmacist if you have questions. COMMON BRAND NAME(S): [facility] What should I tell my care team before I take this medication? They need to know if you have any of these conditions: Cancer Immune system problems An unusual or allergic reaction to Zoster vaccine, other medications, foods, dyes, or preservatives Pregnant or trying to get pregnant Breastfeeding How should I use this medication? This vaccine is injected into a muscle. It is given by your care team. This vaccine requires 2 doses to get the full benefit. Set a reminder for when your next dose is due. A copy of Vaccine Information Statements will be given before each vaccination. Be sure to read this information carefully each time. This sheet may change often. Talk to your care team about the use of this vaccine in children. This vaccine is not approved for use in children. Overdosage: If you think you have taken too much of this medicine contact a poison control center or emergency room at once. NOTE: This medicine is only for you. Do not share this medicine with others. What if I miss a dose? Keep appointments for follow-up (booster) doses. It is important not to miss your dose. Call your care team if you are unable to  keep an appointment. What may interact with this medication? Medications that suppress your immune system Medications to treat cancer Steroid medications, such as prednisone or cortisone This list may not describe all possible interactions. Give your health care provider a list of all the medicines, herbs, non-prescription drugs, or dietary supplements you use. Also tell them if you smoke, drink alcohol, or use illegal drugs. Some items may interact with your medicine. What should I watch for while using this medication? Visit your care team regularly. This vaccine, like all vaccines, may not fully protect everyone. What side effects may I notice from receiving this medication? Side effects that you should report to your care team as soon as possible: Allergic reactions--skin rash, itching, hives, swelling of the face, lips, tongue, or throat Side effects that usually do not require medical attention (report these to your care team if they continue or are bothersome): Chills Fatigue Feeling faint or lightheaded Fever Headache Muscle pain Pain, redness, or irritation at injection site This list may not describe all possible side effects. Call your doctor for medical advice about side effects. You may report side effects to FDA at 1-800-FDA-1088. Where should I keep my medication? This vaccine is only given by your care team. It will not be stored at home. NOTE: This sheet is a summary. It may not cover all possible information. If you have questions about this medicine, talk to your doctor, pharmacist, or health care provider.  2024 Elsevier/Gold Standard (2021-06-25 00:00:00)

## 2022-12-14 NOTE — Assessment & Plan Note (Signed)
BP improved No med dose adjustment needed now

## 2022-12-14 NOTE — Assessment & Plan Note (Signed)
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

## 2022-12-15 LAB — LYME DISEASE SEROLOGY W/REFLEX: Lyme Total Antibody EIA: NEGATIVE

## 2023-01-05 ENCOUNTER — Other Ambulatory Visit: Payer: Self-pay | Admitting: Family Medicine

## 2023-01-05 ENCOUNTER — Other Ambulatory Visit: Payer: Self-pay | Admitting: Gastroenterology

## 2023-01-05 DIAGNOSIS — Z1211 Encounter for screening for malignant neoplasm of colon: Secondary | ICD-10-CM

## 2023-03-15 ENCOUNTER — Ambulatory Visit: Payer: Managed Care, Other (non HMO) | Admitting: Student

## 2023-03-16 ENCOUNTER — Ambulatory Visit (INDEPENDENT_AMBULATORY_CARE_PROVIDER_SITE_OTHER): Payer: Managed Care, Other (non HMO) | Admitting: Student

## 2023-03-16 VITALS — BP 173/99 | HR 90 | Wt 152.4 lb

## 2023-03-16 DIAGNOSIS — M542 Cervicalgia: Secondary | ICD-10-CM | POA: Diagnosis not present

## 2023-03-16 DIAGNOSIS — M79601 Pain in right arm: Secondary | ICD-10-CM | POA: Diagnosis not present

## 2023-03-16 DIAGNOSIS — M25511 Pain in right shoulder: Secondary | ICD-10-CM

## 2023-03-16 MED ORDER — BACLOFEN 5 MG PO TABS: 10.0000 mg | ORAL_TABLET | Freq: Two times a day (BID) | ORAL | 0 refills | Status: AC

## 2023-03-16 NOTE — Progress Notes (Signed)
    SUBJECTIVE:   CHIEF COMPLAINT / HPI:   Right cervical neck/shoulder/arm pain Pain initially started approximately 3 weeks ago after patient lifted a 24 pack of water at [employer]. No history of prior neck or shoulder injury/surgeries. Pain starts in the right neck, shooting pain down arm into the wrist.  He reports he initially had reduced grip strength that has now resolved. OBJECTIVE:   BP (!) 173/99   Pulse 90   Wt 152 lb 6.4 oz (69.1 kg)   SpO2 100%   BMI 23.87 kg/m    Neck: No gross deformity, no ecchymosis, no swelling, no rash.  No TTP.  Full range of motion.  Spurling negative. Right shoulders: No gross deformity, no ecchymosis, no swelling.  TTP over posterior lateral trapezius, lateral deltoid.  Full range of motion.  5/5 strength, normal sensation. Hawkins positive Adduction test negative Empty can negative O'Brien's negative Yergason negative Right arm: TTP over pronator teres.  Unable to elicit radicular symptoms with resisted supination.  No TTP over lateral/medial epicondyle. Right wrist/hand: No TTP.  5/5 grip strength.  Tinel's test negative.  ASSESSMENT/PLAN:   Assessment & Plan Cervical pain (neck) Acute cervical neck pain status post lifting 2 weeks ago.  Symptoms consistent with radiculopathy, however exam inconsistent with radicular symptoms (see above).  Differential includes: Muscle strain, cervical radiculopathy, burner's syndrome, rotator cuff tendinopathy, arthritis. - Baclofen 5 mg twice daily - Home exercise plan provided and demonstrated - Cervical neck x-ray ordered - Follow-up in 4 to 6 weeks Acute pain of right shoulder As above. Right arm pain As above   Tiffany Kocher, DO [facility]

## 2023-03-16 NOTE — Patient Instructions (Addendum)
 It was great to see you! Thank you for allowing me to participate in your care!   I recommend that you always bring your medications to each appointment as this makes it easy to ensure we are on the correct medications and helps [place] not miss when refills are needed.  Our plans for today:  - Take 5 mg of Baclofen twice a day for 14 days - Please follow the exercises attached to this paper - Please follow-up in 4-6 weeks - An x-ray was ordered for you---you do not need an appointment to have this completed.  I recommend going to [facility] [address] [place]  If the results are normal,I will send you a letter  I will call you with results if anything is abnormal    Take care and seek immediate care sooner if you develop any concerns. Please remember to show up 15 minutes before your scheduled appointment time!  Tiffany Kocher, DO [facility]

## 2023-03-17 ENCOUNTER — Ambulatory Visit (HOSPITAL_COMMUNITY)
Admission: RE | Admit: 2023-03-17 | Discharge: 2023-03-17 | Disposition: A | Discharge status: Home / Self Care | Payer: 59 | Source: Ambulatory Visit | Attending: Family Medicine | Admitting: Family Medicine

## 2023-03-17 DIAGNOSIS — M542 Cervicalgia: Secondary | ICD-10-CM | POA: Diagnosis present

## 2023-04-08 ENCOUNTER — Encounter: Payer: Self-pay | Admitting: Student

## 2023-04-26 ENCOUNTER — Encounter: Payer: Self-pay | Admitting: Family Medicine

## 2023-04-26 ENCOUNTER — Ambulatory Visit: Admitting: Family Medicine

## 2023-04-26 ENCOUNTER — Telehealth: Payer: Self-pay

## 2023-04-26 VITALS — BP 152/88 | HR 73 | Ht 67.0 in | Wt 153.4 lb

## 2023-04-26 DIAGNOSIS — R1310 Dysphagia, unspecified: Secondary | ICD-10-CM | POA: Diagnosis not present

## 2023-04-26 DIAGNOSIS — M5412 Radiculopathy, cervical region: Secondary | ICD-10-CM

## 2023-04-26 DIAGNOSIS — R221 Localized swelling, mass and lump, neck: Secondary | ICD-10-CM | POA: Insufficient documentation

## 2023-04-26 DIAGNOSIS — L309 Dermatitis, unspecified: Secondary | ICD-10-CM

## 2023-04-26 DIAGNOSIS — I10 Essential (primary) hypertension: Secondary | ICD-10-CM | POA: Diagnosis not present

## 2023-04-26 MED ORDER — TRIAMCINOLONE ACETONIDE 0.5 % EX OINT: 1.0000 | TOPICAL_OINTMENT | Freq: Two times a day (BID) | CUTANEOUS | 1 refills | Status: DC | PRN

## 2023-04-26 NOTE — Telephone Encounter (Signed)
 Spoke with patient. Informed of [place] at [facility] Apr. 11th at 10:30. Patient understood.Aquilla Solian, CMA

## 2023-04-26 NOTE — Assessment & Plan Note (Addendum)
 BP poorly controlled ?? Medication adherence Discussed medication adjustment, but he declined Prefers to monitor and recheck in the future Home BP monitoring recommended F/U appointment made

## 2023-04-26 NOTE — Assessment & Plan Note (Addendum)
 Triamcinolone refilled Consider biopsy in the future

## 2023-04-26 NOTE — Assessment & Plan Note (Addendum)
 Etiology unclear No GERD symptoms Given severity, will go ahead and refer to GI He is otherwise up to date with his colonoscopy eval GI referral order placed

## 2023-04-26 NOTE — Progress Notes (Addendum)
 SUBJECTIVE:   CHIEF COMPLAINT / HPI:  Neck Pain  This is a chronic (Here for follow up on his neck pain) problem. Episode onset: Pain started more than 2 months ago. The problem occurs constantly. Associated with: Neck flexion or extension aggravates his symptoms. The quality of the pain is described as aching. The symptoms are aggravated by bending, position and twisting. Associated symptoms include tingling. Pertinent negatives include no numbness. Associated symptoms comments: Pain radiates down his right arm, sometimes feels his right arm is going to sleep. He has tried nothing (However, he has Ibuprofen which he uses for other joint pain) for the symptoms.  HTN: He last took his Lisinopril 20 mg QD last night. He is inconsistent with when he takes this medication. Denies any cardiopulmonary or neuro symptoms. Feels well otherwise.  Dysphagia/Neck lump: C/O difficulty swallowing both liquid and solid x 2 months. Symptoms persist but have not worsened. He endorses mild pain with his symptoms and also a painful lump on the right side of his Neck that is aggravated by touch or swallowing. No neck injury.  Skin rash: C/O skin on his arm, which responded well to Triamcinolone. He is currently off this medication, and his rash seems to be flaring. Rash moves from one part of his skin to the other. However, he only has it on his arms. Trigger unknown.  PERTINENT  PMH / PSH: PMHx reviewed  OBJECTIVE:   BP (!) 152/88   Pulse 73   Ht 5\' 7"  (1.702 m)   Wt 153 lb 6 oz (69.6 kg)   SpO2 99%   BMI 24.02 kg/m   Physical Exam Vitals and nursing note reviewed.  HENT:     Head:     Comments: Small, non-mobile, mildly tender, palpable mass of his right anterior cervical LN chain Cardiovascular:     Rate and Rhythm: Normal rate and regular rhythm.     Heart sounds: Normal heart sounds. No murmur heard. Pulmonary:     Effort: Pulmonary effort is normal. No respiratory distress.     Breath  sounds: Normal breath sounds. No wheezing.  Musculoskeletal:     Right shoulder: Normal.     Left shoulder: Normal.     Cervical back: Normal. No swelling, deformity or tenderness.     Right lower leg: No edema.     Left lower leg: No edema.  Skin:    Comments: Mild scanty, hyperpigmented macular rash on his forearm, more pronounced on the right forearm.  Neurological:     General: No focal deficit present.     Cranial Nerves: Cranial nerves 2-12 are intact.     Sensory: Sensation is intact.     Motor: Motor function is intact.     Coordination: Coordination is intact.     Deep Tendon Reflexes: Reflexes are normal and symmetric.      ASSESSMENT/PLAN:   Assessment & Plan Lump in neck ?? Cervical lymphadenopathy/lymphadenitis Although, no erythema or intense pain Given associated dysphagia, soft tissue US ordered today Recent TSH was normal Consider repeat TSH depending on the soft tissue US report. Essential hypertension, benign BP poorly controlled ?? Medication adherence Discussed medication adjustment, but he declined Prefers to monitor and recheck in the future Home BP monitoring recommended F/U appointment made Dysphagia, unspecified type Etiology unclear No GERD symptoms Given severity, will go ahead and refer to GI He is otherwise up to date with his colonoscopy eval GI referral order placed Cervical radiculopathy Neck xray reviewed  showing DJD Continue Ibuprofen 800 mg prn Referral to PT and Ortho placed Dermatitis Triamcinolone refilled Consider biopsy in the future     Janit Pagan, MD [facility]

## 2023-04-26 NOTE — Assessment & Plan Note (Addendum)
??   Cervical lymphadenopathy/lymphadenitis Although, no erythema or intense pain Given associated dysphagia, soft tissue US ordered today Recent TSH was normal Consider repeat TSH depending on the soft tissue US report.

## 2023-04-26 NOTE — Assessment & Plan Note (Addendum)
 Neck xray reviewed showing DJD Continue Ibuprofen 800 mg prn Referral to PT and Ortho placed

## 2023-04-26 NOTE — Patient Instructions (Signed)
 Managing Your Hypertension Hypertension, also called high blood pressure, is when the force of the blood pressing against the walls of the arteries is too strong. Arteries are blood vessels that carry blood from your heart throughout your body. Hypertension forces the heart to work harder to pump blood and may cause the arteries to become narrow or stiff. Understanding blood pressure readings A blood pressure reading includes a higher number over a lower number: The first, or top, number is called the systolic pressure. It is a measure of the pressure in your arteries as your heart beats. The second, or bottom number, is called the diastolic pressure. It is a measure of the pressure in your arteries as the heart relaxes. For most people, a normal blood pressure is below 120/80. Your personal target blood pressure may vary depending on your medical conditions, your age, and other factors. Blood pressure is classified into four stages. Based on your blood pressure reading, your health care provider may use the following stages to determine what type of treatment you need, if any. Systolic pressure and diastolic pressure are measured in a unit called millimeters of mercury (mmHg). Normal Systolic pressure: below 120. Diastolic pressure: below 80. Elevated Systolic pressure: 120-129. Diastolic pressure: below 80. Hypertension stage 1 Systolic pressure: 130-139. Diastolic pressure: 80-89. Hypertension stage 2 Systolic pressure: 140 or above. Diastolic pressure: 90 or above. How can this condition affect me? Managing your hypertension is very important. Over time, hypertension can damage the arteries and decrease blood flow to parts of the body, including the brain, heart, and kidneys. Having untreated or uncontrolled hypertension can lead to: A heart attack. A stroke. A weakened blood vessel (aneurysm). Heart failure. Kidney damage. Eye damage. Memory and concentration problems. Vascular  dementia. What actions can I take to manage this condition? Hypertension can be managed by making lifestyle changes and possibly by taking medicines. Your health care provider will help you make a plan to bring your blood pressure within a normal range. You may be referred for counseling on a healthy diet and physical activity. Nutrition  Eat a diet that is high in fiber and potassium, and low in salt (sodium), added sugar, and fat. An example eating plan is called the DASH diet. DASH stands for Dietary Approaches to Stop Hypertension. To eat this way: Eat plenty of fresh fruits and vegetables. Try to fill one-half of your plate at each meal with fruits and vegetables. Eat whole grains, such as whole-wheat pasta, brown rice, or whole-grain bread. Fill about one-fourth of your plate with whole grains. Eat low-fat dairy products. Avoid fatty cuts of meat, processed or cured meats, and poultry with skin. Fill about one-fourth of your plate with lean proteins such as fish, chicken without skin, beans, eggs, and tofu. Avoid pre-made and processed foods. These tend to be higher in sodium, added sugar, and fat. Reduce your daily sodium intake. Many people with hypertension should eat less than 1,500 mg of sodium a day. Lifestyle  Work with your health care provider to maintain a healthy body weight or to lose weight. Ask what an ideal weight is for you. Get at least 30 minutes of exercise that causes your heart to beat faster (aerobic exercise) most days of the week. Activities may include walking, swimming, or biking. Include exercise to strengthen your muscles (resistance exercise), such as weight lifting, as part of your weekly exercise routine. Try to do these types of exercises for 30 minutes at least 3 days a week. Do  not use any products that contain nicotine or tobacco. These products include cigarettes, chewing tobacco, and vaping devices, such as e-cigarettes. If you need help quitting, ask your  health care provider. Control any long-term (chronic) conditions you have, such as high cholesterol or diabetes. Identify your sources of stress and find ways to manage stress. This may include meditation, deep breathing, or making time for fun activities. Alcohol use Do not drink alcohol if: Your health care provider tells you not to drink. You are pregnant, may be pregnant, or are planning to become pregnant. If you drink alcohol: Limit how much you have to: 0-1 drink a day for women. 0-2 drinks a day for men. Know how much alcohol is in your drink. In the [place] one drink equals one 12 oz bottle of beer (355 mL), one 5 oz glass of wine (148 mL), or one 1 oz glass of hard liquor (44 mL). Medicines Your health care provider may prescribe medicine if lifestyle changes are not enough to get your blood pressure under control and if: Your systolic blood pressure is 130 or higher. Your diastolic blood pressure is 80 or higher. Take medicines only as told by your health care provider. Follow the directions carefully. Blood pressure medicines must be taken as told by your health care provider. The medicine does not work as well when you skip doses. Skipping doses also puts you at risk for problems. Monitoring Before you monitor your blood pressure: Do not smoke, drink caffeinated beverages, or exercise within 30 minutes before taking a measurement. Use the bathroom and empty your bladder (urinate). Sit quietly for at least 5 minutes before taking measurements. Monitor your blood pressure at home as told by your health care provider. To do this: Sit with your back straight and supported. Place your feet flat on the floor. Do not cross your legs. Support your arm on a flat surface, such as a table. Make sure your upper arm is at heart level. Each time you measure, take two or three readings one minute apart and record the results. You may also need to have your blood pressure checked regularly by  your health care provider. General information Talk with your health care provider about your diet, exercise habits, and other lifestyle factors that may be contributing to hypertension. Review all the medicines you take with your health care provider because there may be side effects or interactions. Keep all follow-up visits. Your health care provider can help you create and adjust your plan for managing your high blood pressure. Where to find more information National Heart, Lung, and Blood Institute: PopSteam.is American Heart Association: www.heart.org Contact a health care provider if: You think you are having a reaction to medicines you have taken. You have repeated (recurrent) headaches. You feel dizzy. You have swelling in your ankles. You have trouble with your vision. Get help right away if: You develop a severe headache or confusion. You have unusual weakness or numbness, or you feel faint. You have severe pain in your chest or abdomen. You vomit repeatedly. You have trouble breathing. These symptoms may be an emergency. Get help right away. Call 911. Do not wait to see if the symptoms will go away. Do not drive yourself to the hospital. Summary Hypertension is when the force of blood pumping through your arteries is too strong. If this condition is not controlled, it may put you at risk for serious complications. Your personal target blood pressure may vary depending on your medical conditions,  your age, and other factors. For most people, a normal blood pressure is less than 120/80. Hypertension is managed by lifestyle changes, medicines, or both. Lifestyle changes to help manage hypertension include losing weight, eating a healthy, low-sodium diet, exercising more, stopping smoking, and limiting alcohol. This information is not intended to replace advice given to you by your health care provider. Make sure you discuss any questions you have with your health care  provider. Document Revised: 09/18/2020 Document Reviewed: 09/18/2020 Elsevier Patient Education  2024 [employer].

## 2023-04-29 ENCOUNTER — Other Ambulatory Visit: Payer: Self-pay | Admitting: Family Medicine

## 2023-04-29 ENCOUNTER — Ambulatory Visit (HOSPITAL_COMMUNITY)
Admission: RE | Admit: 2023-04-29 | Discharge: 2023-04-29 | Disposition: A | Discharge status: Home / Self Care | Source: Ambulatory Visit | Attending: Family Medicine | Admitting: Family Medicine

## 2023-04-29 DIAGNOSIS — M5412 Radiculopathy, cervical region: Secondary | ICD-10-CM

## 2023-04-29 DIAGNOSIS — L309 Dermatitis, unspecified: Secondary | ICD-10-CM

## 2023-04-29 DIAGNOSIS — R221 Localized swelling, mass and lump, neck: Secondary | ICD-10-CM

## 2023-04-29 DIAGNOSIS — R1310 Dysphagia, unspecified: Secondary | ICD-10-CM

## 2023-04-29 DIAGNOSIS — I10 Essential (primary) hypertension: Secondary | ICD-10-CM

## 2023-05-01 ENCOUNTER — Other Ambulatory Visit: Payer: Self-pay | Admitting: Family Medicine

## 2023-05-01 ENCOUNTER — Encounter: Payer: Self-pay | Admitting: Family Medicine

## 2023-05-01 DIAGNOSIS — E041 Nontoxic single thyroid nodule: Secondary | ICD-10-CM

## 2023-05-02 NOTE — Progress Notes (Signed)
 Attempted to reach GI to schedule biopsy. No answer. LVM for someone to call me back to have that done .Linnie Riches, CMA

## 2023-05-02 NOTE — Telephone Encounter (Signed)
 Patient calls nurse line requesting a sooner apt with PCP.   Patient reports he would like to discuss next steps with recent ultrasound findings. Advised he has the soonest available apt with PCP on 5/5.  Advised he can communicate questions for PCP in mychart.   Patient agreed with plan.

## 2023-05-03 ENCOUNTER — Other Ambulatory Visit (HOSPITAL_COMMUNITY)
Admission: RE | Admit: 2023-05-03 | Discharge: 2023-05-03 | Disposition: A | Discharge status: Home / Self Care | Source: Ambulatory Visit | Attending: Physician Assistant | Admitting: Physician Assistant

## 2023-05-03 ENCOUNTER — Ambulatory Visit
Admission: RE | Admit: 2023-05-03 | Discharge: 2023-05-03 | Disposition: A | Discharge status: Home / Self Care | Source: Ambulatory Visit | Attending: Family Medicine | Admitting: Family Medicine

## 2023-05-03 DIAGNOSIS — E041 Nontoxic single thyroid nodule: Secondary | ICD-10-CM | POA: Diagnosis present

## 2023-05-05 ENCOUNTER — Encounter: Payer: Self-pay | Admitting: Family Medicine

## 2023-05-05 LAB — CYTOLOGY - NON PAP

## 2023-05-11 ENCOUNTER — Encounter (HOSPITAL_COMMUNITY): Payer: Self-pay | Admitting: Emergency Medicine

## 2023-05-11 ENCOUNTER — Emergency Department (HOSPITAL_COMMUNITY)

## 2023-05-11 ENCOUNTER — Emergency Department (HOSPITAL_COMMUNITY)
Admission: EM | Admit: 2023-05-11 | Discharge: 2023-05-12 | Disposition: A | Discharge status: Home / Self Care | Attending: Emergency Medicine | Admitting: Emergency Medicine

## 2023-05-11 ENCOUNTER — Other Ambulatory Visit: Payer: Self-pay

## 2023-05-11 DIAGNOSIS — M79601 Pain in right arm: Secondary | ICD-10-CM

## 2023-05-11 DIAGNOSIS — M79621 Pain in right upper arm: Secondary | ICD-10-CM | POA: Insufficient documentation

## 2023-05-11 DIAGNOSIS — D649 Anemia, unspecified: Secondary | ICD-10-CM | POA: Insufficient documentation

## 2023-05-11 DIAGNOSIS — Z79899 Other long term (current) drug therapy: Secondary | ICD-10-CM | POA: Insufficient documentation

## 2023-05-11 DIAGNOSIS — I10 Essential (primary) hypertension: Secondary | ICD-10-CM | POA: Diagnosis not present

## 2023-05-11 DIAGNOSIS — R079 Chest pain, unspecified: Secondary | ICD-10-CM | POA: Insufficient documentation

## 2023-05-11 LAB — COMPREHENSIVE METABOLIC PANEL WITH GFR
ALT: 12 U/L (ref 0–44)
AST: 14 U/L — ABNORMAL LOW (ref 15–41)
Albumin: 2.7 g/dL — ABNORMAL LOW (ref 3.5–5.0)
Alkaline Phosphatase: 89 U/L (ref 38–126)
Anion gap: 10 (ref 5–15)
BUN: 10 mg/dL (ref 8–23)
CO2: 22 mmol/L (ref 22–32)
Calcium: 9 mg/dL (ref 8.9–10.3)
Chloride: 103 mmol/L (ref 98–111)
Creatinine, Ser: 0.9 mg/dL (ref 0.61–1.24)
GFR, Estimated: >60 mL/min (ref >60)
Glucose, Bld: 114 mg/dL — ABNORMAL HIGH (ref 70–99)
Potassium: 3.7 mmol/L (ref 3.5–5.1)
Sodium: 135 mmol/L (ref 135–145)
Total Bilirubin: 0.4 mg/dL (ref 0.0–1.2)
Total Protein: 7.4 g/dL (ref 6.5–8.1)

## 2023-05-11 LAB — CBC WITH DIFFERENTIAL/PLATELET
Abs Immature Granulocytes: 0.03 10*3/uL (ref 0.00–0.07)
Basophils Absolute: 0 10*3/uL (ref 0.0–0.1)
Basophils Relative: 0 %
Eosinophils Absolute: 0.2 10*3/uL (ref 0.0–0.5)
Eosinophils Relative: 2 %
HCT: 30.8 % — ABNORMAL LOW (ref 39.0–52.0)
Hemoglobin: 10.2 g/dL — ABNORMAL LOW (ref 13.0–17.0)
Immature Granulocytes: 0 %
Lymphocytes Relative: 11 %
Lymphs Abs: 1.1 10*3/uL (ref 0.7–4.0)
MCH: 26 pg (ref 26.0–34.0)
MCHC: 33.1 g/dL (ref 30.0–36.0)
MCV: 78.4 fL — ABNORMAL LOW (ref 80.0–100.0)
Monocytes Absolute: 0.9 10*3/uL (ref 0.1–1.0)
Monocytes Relative: 10 %
Neutro Abs: 7.5 10*3/uL (ref 1.7–7.7)
Neutrophils Relative %: 77 %
Platelets: 375 10*3/uL (ref 150–400)
RBC: 3.93 MIL/uL — ABNORMAL LOW (ref 4.22–5.81)
RDW: 13 % (ref 11.5–15.5)
WBC: 9.8 10*3/uL (ref 4.0–10.5)
nRBC: 0 % (ref 0.0–0.2)

## 2023-05-11 LAB — TROPONIN I (HIGH SENSITIVITY)
Troponin I (High Sensitivity): 8 ng/L (ref <18)
Troponin I (High Sensitivity): 8 ng/L (ref <18)

## 2023-05-11 NOTE — ED Provider Triage Note (Signed)
 Emergency Medicine Provider Triage Evaluation Note  Joseph Tyler , a 63 y.o. male  was evaluated in triage.  Pt complains of multiple issues, headache neck pain chest pain.  History of all these the last few weeks it sounds like.  Mostly seems like he might be concerned about his chest pain today.  He has been seen for the headache neck pain and shoulder issues in the past.  Denies any shortness of breath.  No recent surgery or travel.  No weakness numbness tingling.  Does not seem like he is having any exertional symptoms.  Review of Systems  Positive: Headache, neck pain, chest pain, shoulder pain Negative: Shortness of breath cough fever chills  Physical Exam  There were no vitals taken for this visit. Gen:   Awake, no distress   Resp:  Normal effort  MSK:   Moves extremities without difficulty  Other:  Tenderness to the soft tissues around the right side of the neck and right shoulder  Medical Decision Making  Medically screening exam initiated at 6:14 PM.  Appropriate orders placed.  Joseph Tyler was informed that the remainder of the evaluation will be completed by another provider, this initial triage assessment does not replace that evaluation, and the importance of remaining in the ED until their evaluation is complete.     Lowery Rue, DO 05/11/23 1815

## 2023-05-11 NOTE — ED Triage Notes (Signed)
 Pt BIB by EMS HA, cough and weakness x 4 days. Pt reports intermittent CP. Denies any congestion, N/V/D.

## 2023-05-12 LAB — POC OCCULT BLOOD, ED: Fecal Occult Bld: NEGATIVE

## 2023-05-12 MED ORDER — PHENYLEPHRINE HCL-NACL 20-0.9 MG/250ML-% IV SOLN
INTRAVENOUS | Status: AC
  Filled 2023-05-12: qty 250

## 2023-05-12 MED ORDER — PROPOFOL 1000 MG/100ML IV EMUL
INTRAVENOUS | Status: AC
  Filled 2023-05-12: qty 400

## 2023-05-12 NOTE — ED Provider Notes (Signed)
 [place] EMERGENCY DEPARTMENT AT [facility] Provider Note   CSN: [id number] Arrival date & time: 05/11/23  1808     History  Chief Complaint  Patient presents with   Weakness    Joseph Tyler is a 63 y.o. male.   Weakness    Patient has a history of hypertension chronic back pain hyperlipidemia.  Patient states he has been having symptoms of pain in his neck and shoulder and arm for a couple of months.  He is also had some pain in his neck and his symptoms seem to increase with certain movements of his neck.  However last evening he also had an episode of pain in his chest.  Patient states he felt like his heart was pounding.  He also felt palpitations in his chest.  He was not having any shortness of breath.  No dyspnea on exertion.  No fevers chills or coughing.  Home Medications Prior to Admission medications   Medication Sig Start Date End Date Taking? Authorizing Provider  ibuprofen  (ADVIL ) 800 MG tablet TAKE 1 TABLET (800 MG TOTAL) BY MOUTH DAILY AS NEEDED. Patient not taking: Reported on 04/26/2023 01/05/23   Arn Lane, MD  lisinopril  (ZESTRIL ) 20 MG tablet Take 1 tablet (20 mg total) by mouth at bedtime. 11/01/22   Arn Lane, MD  tamsulosin  (FLOMAX ) 0.4 MG CAPS capsule Take 2 capsules (0.8 mg total) by mouth daily. 11/19/22   Arn Lane, MD  triamcinolone  ointment (KENALOG ) 0.5 % Apply 1 Application topically 2 (two) times daily as needed. Use as needed for skin rash 04/26/23   Arn Lane, MD      Allergies    Other    Review of Systems   Review of Systems  Neurological:  Positive for weakness.    Physical Exam Updated Vital Signs BP 128/78 (BP Location: Right Arm)   Pulse 78   Temp 98.4 F (36.9 C) (Oral)   Resp 15   SpO2 100%  Physical Exam Vitals and nursing note reviewed.  Constitutional:      General: He is not in acute distress.    Appearance: He is well-developed.  HENT:     Head: Normocephalic and  atraumatic.     Right Ear: External ear normal.     Left Ear: External ear normal.  Eyes:     General: No scleral icterus.       Right eye: No discharge.        Left eye: No discharge.     Conjunctiva/sclera: Conjunctivae normal.  Neck:     Trachea: No tracheal deviation.  Cardiovascular:     Rate and Rhythm: Normal rate and regular rhythm.  Pulmonary:     Effort: Pulmonary effort is normal. No respiratory distress.     Breath sounds: Normal breath sounds. No stridor. No wheezing or rales.  Abdominal:     General: Bowel sounds are normal. There is no distension.     Palpations: Abdomen is soft.     Tenderness: There is no abdominal tenderness. There is no guarding or rebound.  Musculoskeletal:        General: Tenderness present. No deformity.     Cervical back: Neck supple.     Comments: Mild pain with range of motion right shoulder  Skin:    General: Skin is warm and dry.     Findings: No rash.  Neurological:     General: No focal deficit present.     Mental  Status: He is alert.     Cranial Nerves: No cranial nerve deficit, dysarthria or facial asymmetry.     Sensory: No sensory deficit.     Motor: No abnormal muscle tone or seizure activity.     Coordination: Coordination normal.  Psychiatric:        Mood and Affect: Mood normal.     ED Results / Procedures / Treatments   Labs (all labs ordered are listed, but only abnormal results are displayed) Labs Reviewed  CBC WITH DIFFERENTIAL/PLATELET - Abnormal; Notable for the following components:      Result Value   RBC 3.93 (*)    Hemoglobin 10.2 (*)    HCT 30.8 (*)    MCV 78.4 (*)    All other components within normal limits  COMPREHENSIVE METABOLIC PANEL WITH GFR - Abnormal; Notable for the following components:   Glucose, Bld 114 (*)    Albumin 2.7 (*)    AST 14 (*)    All other components within normal limits  POC OCCULT BLOOD, ED  TROPONIN I (HIGH SENSITIVITY)  TROPONIN I (HIGH SENSITIVITY)    EKG EKG  Interpretation Date/Time:  Wednesday May 11 2023 18:15:28 EDT Ventricular Rate:  94 PR Interval:  136 QRS Duration:  76 QT Interval:  324 QTC Calculation: 405 R Axis:   34  Text Interpretation: Normal sinus rhythm with sinus arrhythmia Minimal voltage criteria for LVH, may be normal variant ( Sokolow-Lyon ) Borderline ECG No significant change since last tracing Confirmed by Trish Furl [phone]) on 05/12/2023 8:34:08 AM  Radiology DG Shoulder Right Result Date: 05/11/2023 CLINICAL DATA:  Cp Patient notes neck pain and right facial numbness along with right shoulder pain that radiates down arm. He notes more recent chest pain that started 4 days ago Per triage notes: Pt BIB by EMS HA, cough and weakness x 4 days EXAM: RIGHT SHOULDER - 2+ VIEW COMPARISON:  None Available. FINDINGS: There is no evidence of fracture or dislocation. There is no evidence of severe arthropathy or other focal bone abnormality. Soft tissues are unremarkable. Old healed right rib fractures. IMPRESSION: No acute displaced fracture or dislocation. Electronically Signed   By: Morgane  Naveau M.D.   On: 05/11/2023 19:26   DG Chest 2 View Result Date: 05/11/2023 CLINICAL DATA:  Chest pain EXAM: CHEST - 2 VIEW COMPARISON:  Chest x-ray 02/03/2018 FINDINGS: The heart size and mediastinal contours are within normal limits. Both lungs are clear. The visualized skeletal structures are unremarkable. IMPRESSION: No active cardiopulmonary disease. Electronically Signed   By: Tyron Gallon M.D.   On: 05/11/2023 19:25   CT Cervical Spine Wo Contrast Result Date: 05/11/2023 CLINICAL DATA:  Headache and neck pain. EXAM: CT CERVICAL SPINE WITHOUT CONTRAST TECHNIQUE: Multidetector CT imaging of the cervical spine was performed without intravenous contrast. Multiplanar CT image reconstructions were also generated. RADIATION DOSE REDUCTION: This exam was performed according to the departmental dose-optimization program which includes automated  exposure control, adjustment of the mA and/or kV according to patient size and/or use of iterative reconstruction technique. COMPARISON:  Cervical spine radiographs 03/17/2023. MRI of the cervical spine 08/26/2010 FINDINGS: Alignment: No significant listhesis is present. Straightening and slight reversal of the normal cervical lordosis is present. Skull base and vertebrae: Degenerative changes are present C1-2. Craniocervical junction is otherwise normal. The vertebral body heights are normal. No acute fractures are present. Soft tissues and spinal canal: No prevertebral fluid or swelling. No visible canal hematoma. Disc levels: Uncovertebral and facet hypertrophy lead  to moderate left foraminal narrowing at C2-3 and C3-4. No significant central disc protrusion or stenosis is present. Upper chest: The lung apices are clear. The thoracic inlet is within normal limits. IMPRESSION: 1. No acute fracture or traumatic subluxation. 2. Moderate left foraminal narrowing at C2-3 and C3-4 secondary to uncovertebral and facet hypertrophy. Electronically Signed   By: Audree Leas M.D.   On: 05/11/2023 19:21   CT HEAD WO CONTRAST ( ) Result Date: 05/11/2023 CLINICAL DATA:  Headache, increasing frequency or severity. EXAM: CT HEAD WITHOUT CONTRAST TECHNIQUE: Contiguous axial images were obtained from the base of the skull through the vertex without intravenous contrast. RADIATION DOSE REDUCTION: This exam was performed according to the departmental dose-optimization program which includes automated exposure control, adjustment of the mA and/or kV according to patient size and/or use of iterative reconstruction technique. COMPARISON:  MR head without and with contrast 12/15/2021. CT head without contrast 05/25/2019 FINDINGS: Brain: No acute infarct, hemorrhage, or mass lesion is present. Scattered subcortical white matter hypoattenuation is again noted, moderately advanced for age. Deep brain nuclei are within normal  limits. The ventricles are of normal size. No significant extraaxial fluid collection is present. The brainstem and cerebellum are within normal limits. Midline structures are within normal limits. Vascular: No hyperdense vessel or unexpected calcification. Skull: Calvarium is intact. No focal lytic or blastic lesions are present. No significant extracranial soft tissue lesion is present. Sinuses/Orbits: The paranasal sinuses and mastoid air cells are clear. The globes and orbits are within normal limits. IMPRESSION: 1. No acute intracranial abnormality or significant interval change. 2. Scattered subcortical white matter hypoattenuation is again noted, moderately advanced for age. This likely reflects the sequela of chronic microvascular ischemia. Electronically Signed   By: Audree Leas M.D.   On: 05/11/2023 19:16    Procedures Procedures    Medications Ordered in ED Medications - No data to display  ED Course/ Medical Decision Making/ A&P Clinical Course as of 05/12/23 0956  Thu May 12, 2023  0832 CBC with Differential(!) Hemoglobin decreased compared to previous.  Metabolic panel normal.  Troponin normal [JK]  0833 Head CT [JK]  0833  does not show any acute abnormality.  Chronic findings noted [JK]  0833 C-spine CT shows some foraminal narrowing on the left side at C2-C3 and C3 and C4 [JK]  0833 Chest x-ray without acute abnormality.  Shoulder x-ray without acute abnormality [JK]  0955 Occult negative for blood [JK]    Clinical Course User Index [JK] Trish Furl, MD                                 Medical Decision Making Amount and/or Complexity of Data Reviewed Labs:  Decision-making details documented in ED Course.   Patient presented to ED with complaints of chronic symptoms of neck and arm pain but also had acute episode of chest pain.  His arm pain may be related to a cervical radiculopathy.  There is no signs of any dislocation or bony abnormality in his CT cervical  spine or shoulder.  I do not feel that his symptoms are referred as there is clearly some tenderness on exam and pain with movement and position.  Patient may benefit from further outpatient workup with his PCP.  Is possible he might have a cervical radiculopathy.  Could consider outpatient MRI but no emergent need at this time.  Patient also reported pain in his chest.  ED  workup overall reassuring.  Serial troponins normal.  No acute EKG findings.  With his age and comorbidities I do think he would benefit from further outpatient workup.  Patient's hemoglobin noted be decreased compared to prior.  Will perform Hemoccult to make sure there is no signs of any upper GI bleeding  Occult blood negative.  No signs of GI bleeding        Final Clinical Impression(s) / ED Diagnoses Final diagnoses:  Chest pain, unspecified type  Pain of right upper extremity  Anemia, unspecified type    Rx / DC Orders ED Discharge Orders     None         Trish Furl, MD 05/12/23 [id number]

## 2023-05-12 NOTE — Discharge Instructions (Signed)
 The test in the ED today were reassuring.  No signs of heart attack or pneumonia.  The x-rays did not show any signs of acute abnormalities in the bones or joints.  Continue your current medications.  As we discussed, follow-up with your primary care doctor for further evaluation of the neck and arm issues you have been having.  I did place a referral to cardiology for further evaluation of the chest pain episode you had today.

## 2023-05-18 ENCOUNTER — Ambulatory Visit (HOSPITAL_BASED_OUTPATIENT_CLINIC_OR_DEPARTMENT_OTHER): Admitting: Cardiovascular Disease

## 2023-05-18 NOTE — Progress Notes (Deleted)
  Cardiology Office Note:  .   Date:  05/18/2023  ID:  Joseph Tyler, Joseph Tyler, MRN [id number] PCP: Arn Lane, MD  [facility] Providers Cardiologist:  None { Click to update primary MD,subspecialty MD or APP then REFRESH:1}   History of Present Illness: .   Joseph Tyler is a 63 y.o. male with hypertension, hyperlipidemia, and vascular dementia here for follow-up.  He previously saw Dr. Amanda Jungling 11/2022 for chest pain.  However he denied having chest pain at the time.  Prior echo 07/2022 revealed LVEF 55-60% with normal diastolic function.  He was instructed to follow-up as needed.  He was seen in the ED/2025 with neck pain and reported an episode of chest pain.  He also reported palpitations and was instructed to follow-up with cardiology.  There was also concern he may have cervical radiculopathy.  Cardiac enzymes were negative.  EKG was unremarkable.  Discussed the use of AI scribe software for clinical note transcription with the patient, who gave verbal consent to proceed.  History of Present Illness     ROS:  As per HPI  Studies Reviewed: .       Echo 07/2022:  1. Left ventricular ejection fraction, by estimation, is 55 to 60%. The  left ventricle has normal function. The left ventricle has no regional  wall motion abnormalities. Left ventricular diastolic parameters were  normal.   2. Right ventricular systolic function is normal. The right ventricular  size is normal. There is normal pulmonary artery systolic pressure.   3. The mitral valve is normal in structure. Trivial mitral valve  regurgitation. No evidence of mitral stenosis.   4. The aortic valve is tricuspid. Aortic valve regurgitation is not  visualized. No aortic stenosis is present.   5. The inferior vena cava is normal in size with greater than 50%  respiratory variability, suggesting right atrial pressure of 3 mmHg.  *** Risk Assessment/Calculations:   {Does this patient  have ATRIAL FIBRILLATION?:[phone]} No BP recorded.  {Refresh Note OR Click here to enter BP  :1}***       Physical Exam:   VS:  There were no vitals taken for this visit. , BMI There is no height or weight on file to calculate BMI. GENERAL:  Well appearing HEENT: Pupils equal round and reactive, fundi not visualized, oral mucosa unremarkable NECK:  No jugular venous distention, waveform within normal limits, carotid upstroke brisk and symmetric, no bruits, no thyromegaly LYMPHATICS:  No cervical adenopathy LUNGS:  Clear to auscultation bilaterally HEART:  RRR.  PMI not displaced or sustained,S1 and S2 within normal limits, no S3, no S4, no clicks, no rubs, *** murmurs ABD:  Flat, positive bowel sounds normal in frequency in pitch, no bruits, no rebound, no guarding, no midline pulsatile mass, no hepatomegaly, no splenomegaly EXT:  2 plus pulses throughout, no edema, no cyanosis no clubbing SKIN:  No rashes no nodules NEURO:  Cranial nerves II through XII grossly intact, motor grossly intact throughout PSYCH:  Cognitively intact, oriented to person place and time   ASSESSMENT AND PLAN: .   *** Assessment and Plan Assessment & Plan         {Are you ordering a CV Procedure (e.g. stress test, cath, DCCV, TEE, etc)?   Press F2        :[id number]}  Dispo: ***  Signed, Maudine Sos, MD

## 2023-05-22 NOTE — Progress Notes (Deleted)
  Cardiology Office Note:  .   Date:  05/22/2023  ID:  Joseph Tyler, DOB [birth year], MRN [id number] PCP: Arn Lane, MD  [facility] Providers Cardiologist:  None { Click to update primary MD,subspecialty MD or APP then REFRESH:1}    No chief complaint on file.   Patient Profile: .     Joseph Tyler is a *** 63 y.o. male *** with a PMH notable for *** who presents here for ER Follow-up for Chest Pain at the request of Trish Furl, MD.  {There is no content from the last Narrative History section.}      Joseph Tyler was last seen on ***  Subjective  Discussed the use of AI scribe software for clinical note transcription with the patient, who gave verbal consent to proceed.  History of Present Illness      Cardiovascular ROS: {roscv:310661}  ROS:  Review of Systems - {ros master:310782}    Objective    Studies Reviewed: Aaron Aas        ECHO: *** CATH: *** MONITOR: *** CT: ***  Risk Assessment/Calculations:   {Does this patient have ATRIAL FIBRILLATION?[phone]} No BP recorded.  {Refresh Note OR Click here to enter BP  :1}***         Physical Exam:   VS:  There were no vitals taken for this visit.   Wt Readings from Last 3 Encounters:  04/26/23 153 lb 6 oz (69.6 kg)  03/16/23 152 lb 6.4 oz (69.1 kg)  12/14/22 155 lb 8 oz (70.5 kg)    GEN: Well nourished, well developed in no acute distress; *** NECK: No JVD; No carotid bruits CARDIAC: Normal S1, S2; RRR, no murmurs, rubs, gallops RESPIRATORY:  Clear to auscultation without rales, wheezing or rhonchi ; nonlabored, good air movement. ABDOMEN: Soft, non-tender, non-distended EXTREMITIES:  No edema; No deformity      ASSESSMENT AND PLAN: .    Problem List Items Addressed This Visit   None   Assessment and Plan Assessment & Plan        {Are you ordering a CV Procedure (e.g. stress test, cath, DCCV, TEE, etc)?   Press F2        :[id number]}   Follow-Up:  No follow-ups on file.  Total time spent: *** min spent with patient + *** min spent charting = *** min    Signed, Arleen Lacer, MD, MS Randene Bustard, M.D., M.S. Interventional Chartered certified accountant  Pager # [phone]

## 2023-05-23 ENCOUNTER — Ambulatory Visit (INDEPENDENT_AMBULATORY_CARE_PROVIDER_SITE_OTHER): Admitting: Family Medicine

## 2023-05-23 ENCOUNTER — Ambulatory Visit: Admitting: Cardiology

## 2023-05-23 ENCOUNTER — Encounter: Payer: Self-pay | Admitting: Family Medicine

## 2023-05-23 VITALS — BP 126/83 | HR 77 | Ht 67.0 in | Wt 148.4 lb

## 2023-05-23 DIAGNOSIS — M898X9 Other specified disorders of bone, unspecified site: Secondary | ICD-10-CM

## 2023-05-23 DIAGNOSIS — Z114 Encounter for screening for human immunodeficiency virus [HIV]: Secondary | ICD-10-CM

## 2023-05-23 DIAGNOSIS — M5412 Radiculopathy, cervical region: Secondary | ICD-10-CM

## 2023-05-23 DIAGNOSIS — D509 Iron deficiency anemia, unspecified: Secondary | ICD-10-CM

## 2023-05-23 DIAGNOSIS — I10 Essential (primary) hypertension: Secondary | ICD-10-CM

## 2023-05-23 DIAGNOSIS — R634 Abnormal weight loss: Secondary | ICD-10-CM

## 2023-05-23 DIAGNOSIS — D649 Anemia, unspecified: Secondary | ICD-10-CM

## 2023-05-23 DIAGNOSIS — R61 Generalized hyperhidrosis: Secondary | ICD-10-CM

## 2023-05-23 DIAGNOSIS — R509 Fever, unspecified: Secondary | ICD-10-CM

## 2023-05-23 MED ORDER — IBUPROFEN 400 MG PO TABS: 400.0000 mg | ORAL_TABLET | Freq: Three times a day (TID) | ORAL | 1 refills | Status: DC | PRN

## 2023-05-23 MED ORDER — GABAPENTIN 100 MG PO CAPS
100.0000 mg | ORAL_CAPSULE | Freq: Three times a day (TID) | ORAL | 1 refills | Status: DC
Start: 2023-05-23 — End: 2023-06-21

## 2023-05-23 NOTE — Patient Instructions (Signed)
 It was nice seeing you today. I am sorry about your weight loss and other symptoms. We will get some test done today to better assess this. I will call you with test results soon.Aaron Aas

## 2023-05-23 NOTE — Assessment & Plan Note (Addendum)
 CT head and neck were done in the ED and were reviewed and discussed with him No acute intracranial pathology CT spine shows Moderate left foraminal narrowing at C2-3 and C3-4 secondary to uncovertebral and facet hypertrophy. PT and orthopedic referral were placed during the last visit I already messaged our referral specialist about this We will proceed with a Cervical spine MRI given the worsening of his symptoms pending orthopedic referral Gabapentin  initiated

## 2023-05-23 NOTE — Assessment & Plan Note (Addendum)
 He is up to date with cancer screening It may be nutritional as he eats only twice daily for the most part We will screen for infection, given night sweat Repeat Cmet, check anemia panel, Quant gold for TB, HIV, COVID test, TSH, Vit D I will contact him soon with the test results Adequate meal intake is recommended Continue Ibuprofen  as needed for pain and fever

## 2023-05-23 NOTE — Progress Notes (Signed)
    SUBJECTIVE:   CHIEF COMPLAINT / HPI:   HTN: She is compliant with Lisinopril  20 mg QD. A few days ago, his BP was elevated, and he doubled his Lisinopril  for 3 days. He is now back on his regular dose.   Neck pain: C/O headache and neck pain, for which he was evaluated in the ED recently. Neck pain radiates to his right arm mostly, but sometimes to the left. Feels like a shock wave to his right arm and occasional numbness. Pain is aggravated by certain movements or when he rests on his right side at night.   Anemia/Weight loss/Sweating: C/O weight loss, night sweats, and fever, which have occurred daily in the last 2 weeks. Once he takes his Ibuprofen  and sleeps, the fever subsides in the evening. However, he would then be covered with sweat.  PERTINENT  PMH / PSH: Pmhx reviewed  OBJECTIVE:   BP 126/83   Pulse 77   Ht 5\' 7"  (1.702 m)   Wt 148 lb 6.4 oz (67.3 kg)   SpO2 99%   BMI 23.24 kg/m   Physical Exam Vitals and nursing note reviewed.  Cardiovascular:     Rate and Rhythm: Normal rate and regular rhythm.     Heart sounds: Normal heart sounds. No murmur heard. Pulmonary:     Effort: Pulmonary effort is normal. No respiratory distress.     Breath sounds: Normal breath sounds. No wheezing.  Abdominal:     General: Abdomen is flat. Bowel sounds are normal. There is no distension.     Palpations: Abdomen is soft. There is no mass.     Tenderness: There is no abdominal tenderness.     Hernia: No hernia is present.  Musculoskeletal:     Cervical back: Neck supple.  Neurological:     General: No focal deficit present.     Gait: Gait normal.  Psychiatric:        Mood and Affect: Mood normal.      ASSESSMENT/PLAN:   Assessment & Plan Weight loss He is up to date with cancer screening It may be nutritional as he eats only twice daily for the most part We will screen for infection, given night sweat Repeat Cmet, check anemia panel, Quant gold for TB, HIV, COVID  test, TSH, Vit D I will contact him soon with the test results Adequate meal intake is recommended Continue Ibuprofen  as needed for pain and fever Essential hypertension, benign BP looks good on Lisinopril  20 mg every day Advised not to double BP meds without first contacting us  Cervical radiculopathy CT head and neck were done in the ED and were reviewed and discussed with him No acute intracranial pathology CT spine shows Moderate left foraminal narrowing at C2-3 and C3-4 secondary to uncovertebral and facet hypertrophy. PT and orthopedic referral were placed during the last visit I already messaged our referral specialist about this We will proceed with a Cervical spine MRI given the worsening of his symptoms pending orthopedic referral Gabapentin  initiated Microcytic anemia ED lab reviewed Neg FOBT Recent colonoscopy was normal Obtain anemia panel Likely need to get on iron supplement     Selam Pietsch, MD [place]  [facility]

## 2023-05-23 NOTE — Assessment & Plan Note (Addendum)
 BP looks good on Lisinopril  20 mg every day Advised not to double BP meds without first contacting us 

## 2023-05-23 NOTE — Assessment & Plan Note (Addendum)
 ED lab reviewed Neg FOBT Recent colonoscopy was normal Obtain anemia panel Likely need to get on iron supplement

## 2023-05-24 ENCOUNTER — Encounter: Payer: Self-pay | Admitting: Family Medicine

## 2023-05-24 ENCOUNTER — Telehealth: Payer: Self-pay | Admitting: Family Medicine

## 2023-05-24 DIAGNOSIS — R7989 Other specified abnormal findings of blood chemistry: Secondary | ICD-10-CM

## 2023-05-24 DIAGNOSIS — R61 Generalized hyperhidrosis: Secondary | ICD-10-CM

## 2023-05-24 DIAGNOSIS — R634 Abnormal weight loss: Secondary | ICD-10-CM

## 2023-05-24 MED ORDER — VITAMIN D (ERGOCALCIFEROL) 1.25 MG (50000 UNIT) PO CAPS: 50000.0000 [IU] | ORAL_CAPSULE | ORAL | 2 refills | Status: DC

## 2023-05-24 MED ORDER — FERROUS FUMARATE 324 (106 FE) MG PO TABS: 1.0000 | ORAL_TABLET | ORAL | 1 refills | Status: DC

## 2023-05-24 NOTE — Telephone Encounter (Addendum)
 HIPAA compliant callback message left. 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 This may account for his fatigue. I sent iron tablet to his pharmacy to start as soon as possible. Recheck lab in 8 weeks His thyroid  test is abnormal. This could account for his excessive sweating. We need a repeat thyroid  function test. Please schedule lab appointment in one week for this. Lab order in place. He might need a referral to an endocrinologist at some point, pending test results.  One of your Liver enzymes is elevated. This might be due to your bone disease. However, it could also be related to the liver. We cut back on the dose of your Ibuprofen . Please use this medication sparingly. We will recheck your liver in a few weeks as well and obtain a liver ultrasound if needed. Vitamin D  is low. I sent a supplement to your pharmacy. Recheck in 8 weeks. Action: Schedule lab appointment for thyroid  test Schedule follow up with PCP in 4 weeks

## 2023-05-24 NOTE — Addendum Note (Signed)
 Addended by: Penni Bowman T on: 05/24/2023 08:29 AM   Modules accepted: Orders

## 2023-05-24 NOTE — Addendum Note (Signed)
 Addended by: Penni Bowman T on: 05/24/2023 08:31 AM   Modules accepted: Orders

## 2023-05-25 ENCOUNTER — Encounter: Payer: Self-pay | Admitting: Family Medicine

## 2023-05-25 ENCOUNTER — Telehealth: Payer: Self-pay

## 2023-05-25 LAB — COVID-19, FLU A+B NAA
Influenza A, NAA: NOT DETECTED
Influenza B, NAA: NOT DETECTED
SARS-CoV-2, NAA: NOT DETECTED

## 2023-05-25 NOTE — Telephone Encounter (Signed)
-----   Message from [facility]ev[facility] Kern S sent at 05/25/2023 10:13 AM EDT ----- Ok to schedule MRI at [facility].  Thank you! Joseph Tyler

## 2023-05-25 NOTE — Telephone Encounter (Signed)
 Scheduled patient MRI its going to be 05/12 @11 . Patient was notified.

## 2023-05-26 ENCOUNTER — Encounter: Payer: Self-pay | Admitting: Family Medicine

## 2023-05-26 ENCOUNTER — Ambulatory Visit: Admitting: Cardiology

## 2023-05-26 LAB — ANEMIA PROFILE B
Basophils Absolute: 0.1 10*3/uL (ref 0.0–0.2)
Basos: 1 %
EOS (ABSOLUTE): 0.3 10*3/uL (ref 0.0–0.4)
Eos: 3 %
Ferritin: 678 ng/mL — ABNORMAL HIGH (ref 30–400)
Folate: 6.8 ng/mL (ref >3.0)
Hematocrit: 31.9 % — ABNORMAL LOW (ref 37.5–51.0)
Hemoglobin: 10.1 g/dL — ABNORMAL LOW (ref 13.0–17.7)
Immature Grans (Abs): 0.1 10*3/uL (ref 0.0–0.1)
Immature Granulocytes: 1 %
Iron Saturation: 9 % — CL (ref 15–55)
Iron: 15 ug/dL — ABNORMAL LOW (ref 38–169)
Lymphocytes Absolute: 1.4 10*3/uL (ref 0.7–3.1)
Lymphs: 16 %
MCH: 25.1 pg — ABNORMAL LOW (ref 26.6–33.0)
MCHC: 31.7 g/dL (ref 31.5–35.7)
MCV: 79 fL (ref 79–97)
Monocytes Absolute: 0.9 10*3/uL (ref 0.1–0.9)
Monocytes: 11 %
Neutrophils Absolute: 6 10*3/uL (ref 1.4–7.0)
Neutrophils: 68 %
Platelets: 401 10*3/uL (ref 150–450)
RBC: 4.02 x10E6/uL — ABNORMAL LOW (ref 4.14–5.80)
RDW: 13.1 % (ref 11.6–15.4)
Retic Ct Pct: 0.9 % (ref 0.6–2.6)
Total Iron Binding Capacity: 171 ug/dL — ABNORMAL LOW (ref 250–450)
UIBC: 156 ug/dL (ref 111–343)
Vitamin B-12: 403 pg/mL (ref 232–1245)
WBC: 8.7 10*3/uL (ref 3.4–10.8)

## 2023-05-26 LAB — QUANTIFERON-TB GOLD PLUS
QuantiFERON Mitogen Value: 2.87 [IU]/mL
QuantiFERON Nil Value: 0.01 [IU]/mL
QuantiFERON TB1 Ag Value: 0.7 [IU]/mL
QuantiFERON TB2 Ag Value: 0.55 [IU]/mL

## 2023-05-26 LAB — CMP14+EGFR
ALT: 18 IU/L (ref 0–44)
AST: 18 IU/L (ref 0–40)
Albumin: 3.3 g/dL — ABNORMAL LOW (ref 3.9–4.9)
Alkaline Phosphatase: 140 IU/L — ABNORMAL HIGH (ref 44–121)
BUN/Creatinine Ratio: 19 (ref 10–24)
BUN: 17 mg/dL (ref 8–27)
Bilirubin Total: <0.2 mg/dL (ref 0.0–1.2)
CO2: 18 mmol/L — ABNORMAL LOW (ref 20–29)
Calcium: 9.1 mg/dL (ref 8.6–10.2)
Chloride: 103 mmol/L (ref 96–106)
Creatinine, Ser: 0.91 mg/dL (ref 0.76–1.27)
Globulin, Total: 3.4 g/dL (ref 1.5–4.5)
Glucose: 98 mg/dL (ref 70–99)
Potassium: 4.9 mmol/L (ref 3.5–5.2)
Sodium: 136 mmol/L (ref 134–144)
Total Protein: 6.7 g/dL (ref 6.0–8.5)
eGFR: 95 mL/min/{1.73_m2} (ref >59)

## 2023-05-26 LAB — TSH: TSH: 0.022 u[IU]/mL — ABNORMAL LOW (ref 0.450–4.500)

## 2023-05-26 LAB — VITAMIN D 25 HYDROXY (VIT D DEFICIENCY, FRACTURES): Vit D, 25-Hydroxy: 24 ng/mL — ABNORMAL LOW (ref 30.0–100.0)

## 2023-05-26 LAB — HIV ANTIBODY (ROUTINE TESTING W REFLEX): HIV Screen 4th Generation wRfx: NONREACTIVE

## 2023-05-26 NOTE — Addendum Note (Signed)
 Addended by: Penni Bowman T on: 05/26/2023 09:12 AM   Modules accepted: Orders

## 2023-05-27 ENCOUNTER — Encounter: Payer: Self-pay | Admitting: Family Medicine

## 2023-05-27 ENCOUNTER — Other Ambulatory Visit: Payer: Self-pay | Admitting: Family Medicine

## 2023-05-30 ENCOUNTER — Encounter: Payer: Self-pay | Admitting: Family Medicine

## 2023-05-30 ENCOUNTER — Ambulatory Visit (HOSPITAL_COMMUNITY)
Admission: RE | Admit: 2023-05-30 | Discharge: 2023-05-30 | Disposition: A | Discharge status: Home / Self Care | Source: Ambulatory Visit | Attending: Family Medicine | Admitting: Family Medicine

## 2023-05-30 ENCOUNTER — Telehealth: Payer: Self-pay | Admitting: Family Medicine

## 2023-05-30 DIAGNOSIS — M5412 Radiculopathy, cervical region: Secondary | ICD-10-CM | POA: Insufficient documentation

## 2023-05-30 NOTE — Telephone Encounter (Signed)
 Multiple attempts were made to reach the patient regarding his cervical spine MRI result. A HIPAA-compliant callback message was left.  Note - MRI cervical spine abnormal with potential C7 nerve compression. This likely contributes to his neuropathic pain. He was referred to a spine specialist, and they attempted to reach him multiple times to schedule an appointment with no success. I also sent him a MyChart message last week asking him to call a spine specialist for an appointment.  When he calls, please advise that he has an abnormal report and needs to be seen by a spine specialist. Advise ED visit if symptoms are worsening - numbness, weakness, pain, etc.  Call the spine specialist for an appointment at [phone].

## 2023-05-31 ENCOUNTER — Ambulatory Visit: Attending: Cardiovascular Disease | Admitting: Cardiovascular Disease

## 2023-05-31 ENCOUNTER — Encounter: Payer: Self-pay | Admitting: Cardiovascular Disease

## 2023-05-31 VITALS — BP 133/84 | HR 87 | Ht 67.0 in | Wt 146.2 lb

## 2023-05-31 DIAGNOSIS — R079 Chest pain, unspecified: Secondary | ICD-10-CM | POA: Diagnosis not present

## 2023-05-31 NOTE — Patient Instructions (Signed)
 Testing/Procedures: Lexiscan Myoview Stress Test Your physician has requested that you have a lexiscan myoview. For further information please visit [email or web address] Please follow instruction sheet, as given.  Follow-Up: At [facility], you and your health needs are our priority.  As part of our continuing mission to provide you with exceptional heart care, our providers are all part of one team.  This team includes your primary Cardiologist (physician) and Advanced Practice Providers or APPs (Physician Assistants and Nurse Practitioners) who all work together to provide you with the care you need, when you need it.  Your next appointment:   As Needed  Provider:   Ahmad Alert, MD   Other Instructions See separate sheet for stress test instructions

## 2023-05-31 NOTE — Progress Notes (Signed)
  Cardiology Office Note:  .   Date:  05/31/2023  ID:  Joseph Tyler, DOB Jun 03, 1960, MRN [id number] PCP: Arn Lane, MD  [facility] Providers Cardiologist:  None    History of Present Illness: .   Joseph Tyler is a 63 y.o. male who is seen as a referral from the emergency room for episodes of chest pain.  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   Nect CT scan  IMPRESSION: 1. No acute fracture or traumatic subluxation. 2. Moderate left foraminal narrowing at C2-3 and C3-4 secondary to uncovertebral and facet hypertrophy  Neck MRI from May 12 MPRESSION: Right posterolateral to proximal foraminal disc herniation at C6-7 likely to compress the right C7 nerve. Mild indentation upon the right side of the cord as well   ECG from May 11, 2023:  NSR ,  no acute changes.  Mild LVH   Had stress testing ~ 2001    ROS:   Studies Reviewed: .         Risk Assessment/Calculations:             Physical Exam:   VS:  BP 133/84   Pulse 87   Ht 5\' 7"  (1.702 m)   Wt 146 lb 3.2 oz (66.3 kg)   SpO2 98%   BMI 22.90 kg/m    Wt Readings from Last 3 Encounters:  05/31/23 146 lb 3.2 oz (66.3 kg)  05/23/23 148 lb 6.4 oz (67.3 kg)  04/26/23 153 lb 6 oz (69.6 kg)    GEN: Well nourished, well developed in no acute distress NECK: No JVD; No carotid bruits CARDIAC: RRR, no murmurs, rubs,  gallops RESPIRATORY:  Clear to auscultation without rales, wheezing or rhonchi  ABDOMEN: Soft, non-tender, non-distended EXTREMITIES:  No edema; No deformity   ASSESSMENT AND PLAN: .    Chest pain :   some atypical aspects of his CP .   Chest pain seem to be worsening some.  He had stress testing approximately 25 years ago which was negative.  I think repeating a Lexiscan Myoview study would be reasonable.  Will schedule him for [facility] study and we will see him back in the office if the scan is abnormal.  Otherwise was see him as needed.       Dispo: PRN   Signed, Ahmad Alert, MD

## 2023-06-02 ENCOUNTER — Other Ambulatory Visit

## 2023-06-02 DIAGNOSIS — R7989 Other specified abnormal findings of blood chemistry: Secondary | ICD-10-CM

## 2023-06-02 DIAGNOSIS — R61 Generalized hyperhidrosis: Secondary | ICD-10-CM

## 2023-06-02 DIAGNOSIS — R634 Abnormal weight loss: Secondary | ICD-10-CM

## 2023-06-07 ENCOUNTER — Ambulatory Visit: Payer: Self-pay | Admitting: Family Medicine

## 2023-06-07 DIAGNOSIS — R7612 Nonspecific reaction to cell mediated immunity measurement of gamma interferon antigen response without active tuberculosis: Secondary | ICD-10-CM

## 2023-06-07 DIAGNOSIS — E0590 Thyrotoxicosis, unspecified without thyrotoxic crisis or storm: Secondary | ICD-10-CM

## 2023-06-07 NOTE — Telephone Encounter (Signed)
 Call attempts x 2. A HIPAA-compliant callback message was left.  Please advise when he returns my call. Quant gold positive again Need chest xray which I ordered. He can go to the radiology department anytime to get his chest xray. I will refer to the health department for treatment, depending on the x-ray report.

## 2023-06-07 NOTE — Telephone Encounter (Signed)
 Patient returns call to nurse line.   Patient advised of positive Quant Gold.   He reports he does not recall if he has ever tested positive before or if he has had treatment.   Advised next steps will be CXR, advised he can go to Radiology at any point to have this done.   PCP apt scheduled for 6/10.  He was appreciative of time and information.

## 2023-06-09 ENCOUNTER — Ambulatory Visit (INDEPENDENT_AMBULATORY_CARE_PROVIDER_SITE_OTHER): Admitting: Physical Medicine and Rehabilitation

## 2023-06-09 ENCOUNTER — Ambulatory Visit (HOSPITAL_COMMUNITY)
Admission: RE | Admit: 2023-06-09 | Discharge: 2023-06-09 | Disposition: A | Discharge status: Home / Self Care | Source: Ambulatory Visit | Attending: Family Medicine | Admitting: Family Medicine

## 2023-06-09 ENCOUNTER — Encounter: Payer: Self-pay | Admitting: Physical Medicine and Rehabilitation

## 2023-06-09 ENCOUNTER — Encounter (HOSPITAL_COMMUNITY): Payer: Self-pay | Admitting: Cardiovascular Disease

## 2023-06-09 VITALS — BP 142/84 | HR 67

## 2023-06-09 DIAGNOSIS — R7612 Nonspecific reaction to cell mediated immunity measurement of gamma interferon antigen response without active tuberculosis: Secondary | ICD-10-CM | POA: Diagnosis present

## 2023-06-09 DIAGNOSIS — M5010 Cervical disc disorder with radiculopathy, unspecified cervical region: Secondary | ICD-10-CM | POA: Diagnosis not present

## 2023-06-09 DIAGNOSIS — R202 Paresthesia of skin: Secondary | ICD-10-CM

## 2023-06-09 DIAGNOSIS — M5412 Radiculopathy, cervical region: Secondary | ICD-10-CM

## 2023-06-09 NOTE — Progress Notes (Signed)
 Patient comes in today for neck pain. Started having pain in January 2025. Thinks he started having pain after he picked up something and moved it. Pain and numbness radiates down bil shoulders. Worse on the right side. Previously had xrays, CT scan and an MRI. No previous injections. Gets shooting pains. Right handed. Takes Ibuprofen  for pain. Pain level today (7/10)
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
## 2023-06-12 LAB — TSH+T3+FREE T4+T3 FREE
Free T-3: 3.9 pg/mL
Free T4 by Dialysis: 1.6 ng/dL
TSH: <0.01 uU/mL — ABNORMAL LOW
Triiodothyronine (T-3), Serum: 126 ng/dL

## 2023-06-12 LAB — QUANTIFERON-TB GOLD PLUS
QuantiFERON Mitogen Value: 7.64 [IU]/mL
QuantiFERON Nil Value: 0.04 [IU]/mL
QuantiFERON TB1 Ag Value: 0.71 [IU]/mL
QuantiFERON TB2 Ag Value: 0.58 [IU]/mL
QuantiFERON-TB Gold Plus: POSITIVE — AB

## 2023-06-13 NOTE — Addendum Note (Signed)
 Addended by: Arn Lane on: 06/13/2023 06:45 AM   Modules accepted: Orders

## 2023-06-16 ENCOUNTER — Ambulatory Visit: Payer: Self-pay | Admitting: Family Medicine

## 2023-06-16 NOTE — Telephone Encounter (Signed)
 Placed in faxed pile.

## 2023-06-16 NOTE — Telephone Encounter (Signed)
  I called the health department and spoke with [facility] about + Quant Gold and Neg chest x-ray. Patients need treatment for latent TB. The record requested to be faxed to the HD, including Quant gold test results and chest x-ray. Unfortunately, per the RN team, we are unable to release his X-ray result directly to the health department. I will write a letter confirming a negative chest x-ray to be attached to his referral document. I also completed a referral on the HD website.  Tammy requested fax to  Fax: [phone] Attention: Tammy Fossitt

## 2023-06-17 ENCOUNTER — Encounter: Payer: Self-pay | Admitting: Family Medicine

## 2023-06-20 ENCOUNTER — Encounter: Payer: Self-pay | Admitting: Family Medicine

## 2023-06-21 ENCOUNTER — Encounter: Payer: Self-pay | Admitting: Family Medicine

## 2023-06-21 ENCOUNTER — Other Ambulatory Visit: Payer: Self-pay | Admitting: Family Medicine

## 2023-06-21 ENCOUNTER — Ambulatory Visit (INDEPENDENT_AMBULATORY_CARE_PROVIDER_SITE_OTHER): Admitting: Family Medicine

## 2023-06-21 VITALS — BP 149/99 | HR 64 | Ht 67.0 in | Wt 154.4 lb

## 2023-06-21 DIAGNOSIS — M5412 Radiculopathy, cervical region: Secondary | ICD-10-CM

## 2023-06-21 DIAGNOSIS — M87076 Idiopathic aseptic necrosis of unspecified foot: Secondary | ICD-10-CM | POA: Diagnosis not present

## 2023-06-21 DIAGNOSIS — D573 Sickle-cell trait: Secondary | ICD-10-CM | POA: Diagnosis not present

## 2023-06-21 DIAGNOSIS — I10 Essential (primary) hypertension: Secondary | ICD-10-CM | POA: Diagnosis not present

## 2023-06-21 DIAGNOSIS — Z227 Latent tuberculosis: Secondary | ICD-10-CM | POA: Insufficient documentation

## 2023-06-21 DIAGNOSIS — E0590 Thyrotoxicosis, unspecified without thyrotoxic crisis or storm: Secondary | ICD-10-CM

## 2023-06-21 MED ORDER — GABAPENTIN 300 MG PO CAPS: ORAL_CAPSULE | ORAL | 1 refills | Status: DC

## 2023-06-21 MED ORDER — LISINOPRIL 30 MG PO TABS: 30.0000 mg | ORAL_TABLET | Freq: Every day | ORAL | 1 refills | Status: DC

## 2023-06-21 NOTE — Assessment & Plan Note (Addendum)
 No acute flare Continue Ibuprofen  400 mg prn pain I discussed higher dose of Ibuprofen  might cause GI bleed Also on Gabapentin  which might give some pain relieve He agreed with the plan

## 2023-06-21 NOTE — Assessment & Plan Note (Addendum)
 Increased Gabapentin  to 300 mg BID and then TID if BID is well tolerated Continue Ibuprofen  400 mg prn F/U spine specialist as planned

## 2023-06-21 NOTE — Assessment & Plan Note (Addendum)
 Referral already made to the health department for management I also get him their number to call them Given his hx of weight loss and night sweat despite negative chest xray, I wonder if there is some active TB disease ongoing Difficult to tel given he also has hyperthyroidism - recently diagnosed As discussed with him, I will place a referral to ID as well for second opinion He agreed with the plan Referral placed

## 2023-06-21 NOTE — Assessment & Plan Note (Addendum)
 Poorly controlled on Lisinopril  20 mg every day Increased Lisinopril  to 30 mg every day I called his pharmacy and spoke with Peterson Brandt who took his Lisinopril  20 mg off the list Home BP monitoring with f/u instruction provided

## 2023-06-21 NOTE — Assessment & Plan Note (Addendum)
 Stable

## 2023-06-21 NOTE — Progress Notes (Signed)
    SUBJECTIVE:   CHIEF COMPLAINT / HPI:   Cervical radiculopathy: Mild improvement on Ibuprofen  400 mg prn. Wanted to know if he can do 800 mg prn instead. He is also taking Gabapentin  100 mg TID with minimal improvement.   Foot pain: Mild improvement on Ibuprofen  400 mg prn. Wanted to know if he can do 800 mg prn instead.   Thyroid  disorder: Still have occasional night sweats. 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 Weight improved. Still have occasional night sweats. Wanted to know the next step in management.  Sickle cell trait: Does have bone pain. But unrelated to his trait.  BPH symptoms: He does not feel Flomax  helped, and he endorsed doing well with his urination and would like to get off Flomax  for now.  PERTINENT  PMH / PSH: PMHx reviewed  OBJECTIVE:   BP (!) 149/99   Pulse 64   Ht 5\' 7"  (1.702 m)   Wt 154 lb 6 oz (70 kg)   SpO2 100%   BMI 24.18 kg/m   Physical Exam Vitals and nursing note reviewed.  Cardiovascular:     Rate and Rhythm: Normal rate and regular rhythm.     Heart sounds: Normal heart sounds. No murmur heard. Pulmonary:     Effort: Pulmonary effort is normal. No respiratory distress.     Breath sounds: Normal breath sounds. No wheezing.      ASSESSMENT/PLAN:   Assessment & Plan Essential hypertension, benign Poorly controlled on Lisinopril  20 mg every day Increased Lisinopril  to 30 mg every day I called his pharmacy and spoke with Peterson Brandt who took his Lisinopril  20 mg off the list Home BP monitoring with f/u instruction provided Sickle cell trait (HCC) Stable Avascular necrosis of bone of foot (HCC) No acute flare Continue Ibuprofen  400 mg prn pain I discussed higher dose of Ibuprofen  might cause GI bleed Also on Gabapentin  which might give some pain relieve He agreed with the plan Cervical radiculopathy Increased Gabapentin  to 300 mg BID and then TID if BID is well tolerated Continue Ibuprofen  400 mg prn F/U spine specialist as  planned TB lung, latent Referral already made to the health department for management I also get him their number to call them Given his hx of weight loss and night sweat despite negative chest xray, I wonder if there is some active TB disease ongoing Difficult to tel given he also has hyperthyroidism - recently diagnosed As discussed with him, I will place a referral to ID as well for second opinion He agreed with the plan Referral placed Subclinical hyperthyroidism Thyroid  U/S showed thyroid  nodules with benign biopsy report Already referred to endocrinology I gave him their phone number to call for an appointment He agreed with the plan     Penni Bowman, MD [facility]

## 2023-06-21 NOTE — Patient Instructions (Addendum)
 It was nice seeing you today. Your TB test is positive again. However, your xray is normal. We have called the health department to assist with treatment. Please call Tammy Fossitt @ [phone]  Please call endocrinology to schedule an appointment for your abnormal thyroid  test.  [facility] Endocrinology [address] - [phone]

## 2023-06-21 NOTE — Assessment & Plan Note (Addendum)
 Thyroid  U/S showed thyroid  nodules with benign biopsy report Already referred to endocrinology I gave him their phone number to call for an appointment He agreed with the plan

## 2023-06-24 ENCOUNTER — Other Ambulatory Visit: Payer: Self-pay | Admitting: Cardiovascular Disease

## 2023-06-24 ENCOUNTER — Telehealth (HOSPITAL_COMMUNITY): Payer: Self-pay | Admitting: *Deleted

## 2023-06-24 DIAGNOSIS — R079 Chest pain, unspecified: Secondary | ICD-10-CM

## 2023-06-24 NOTE — Telephone Encounter (Signed)
 Left a detailed message on voice mail about his STRESS TEST on 07/01/23 at 8:00. I also left a call back number should he's have questions or needed to reschedule.

## 2023-06-28 ENCOUNTER — Ambulatory Visit: Admitting: Family Medicine

## 2023-06-29 ENCOUNTER — Telehealth: Payer: Self-pay

## 2023-06-29 NOTE — Telephone Encounter (Signed)
 Patient has been dx with latent TB. He is waiting on results for a blood test to see if he can proceed with the medication for TB. He was advised to call the infectious disease dr to see if he can proceed with the epidural steroid injection or we need to wait as this could interfere with the TB medication. He will call me back after he discusses this with the provider.

## 2023-07-01 ENCOUNTER — Ambulatory Visit (HOSPITAL_COMMUNITY)
Admission: RE | Admit: 2023-07-01 | Source: Ambulatory Visit | Attending: Cardiovascular Disease | Admitting: Cardiovascular Disease

## 2023-07-12 ENCOUNTER — Ambulatory Visit: Admitting: Family Medicine

## 2023-07-23 ENCOUNTER — Other Ambulatory Visit: Payer: Self-pay | Admitting: Family Medicine

## 2023-08-08 ENCOUNTER — Other Ambulatory Visit: Payer: Self-pay | Admitting: Family Medicine

## 2023-08-15 ENCOUNTER — Telehealth: Payer: Self-pay

## 2023-08-15 ENCOUNTER — Other Ambulatory Visit (HOSPITAL_COMMUNITY): Payer: Self-pay

## 2023-08-15 NOTE — Telephone Encounter (Signed)
 Pharmacy Patient Advocate Encounter  Insurance verification completed.   The patient is insured through [facility]   Ran test claim for Viread. Currently a quantity of 30 is a 30 day supply and the co-pay is $16.00 . Prescription will need to be filled at [facility] .  Andreas will need a PA.  Baraclude is covered but could not see the cost.  This test claim was processed through [facility]- copay amounts may vary at other pharmacies due to pharmacy/plan contracts, or as the patient moves through the different stages of their insurance plan.

## 2023-08-18 ENCOUNTER — Ambulatory Visit: Admitting: Internal Medicine
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
## 2023-09-08 ENCOUNTER — Ambulatory Visit: Admitting: Internal Medicine
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
## 2023-09-23 ENCOUNTER — Encounter: Payer: Self-pay | Admitting: Family Medicine

## 2023-09-23 DIAGNOSIS — E041 Nontoxic single thyroid nodule: Secondary | ICD-10-CM | POA: Insufficient documentation

## 2023-09-26 ENCOUNTER — Encounter: Payer: Self-pay | Admitting: Family Medicine

## 2023-09-26 ENCOUNTER — Ambulatory Visit (INDEPENDENT_AMBULATORY_CARE_PROVIDER_SITE_OTHER): Admitting: Family Medicine

## 2023-09-26 VITALS — BP 150/88 | HR 63 | Ht 67.0 in | Wt 154.1 lb

## 2023-09-26 DIAGNOSIS — R748 Abnormal levels of other serum enzymes: Secondary | ICD-10-CM

## 2023-09-26 DIAGNOSIS — I10 Essential (primary) hypertension: Secondary | ICD-10-CM | POA: Diagnosis not present

## 2023-09-26 DIAGNOSIS — D509 Iron deficiency anemia, unspecified: Secondary | ICD-10-CM

## 2023-09-26 DIAGNOSIS — E559 Vitamin D deficiency, unspecified: Secondary | ICD-10-CM | POA: Diagnosis not present

## 2023-09-26 NOTE — Patient Instructions (Signed)

## 2023-09-26 NOTE — Progress Notes (Signed)
    SUBJECTIVE:   CHIEF COMPLAINT / HPI:  HTN: Here for f/u. He is compliant with Lisinopril  30 mg QD. Does not check his BP regularly.   BPH: Now urinating well while off of Flomax . No GU symptoms.   Left foot pain: Due to osteonecrosis, I have much improved with pain meds. He is still contemplating surgery.   Elevated liver enzymes/Anemia/Vit D deficiency: No acute symptoms. Here for lab follow up  PERTINENT  PMH / PSH: PMHx reviewed  OBJECTIVE:   BP (!) 153/94   Pulse 64   Ht 5' 7 (1.702 m)   Wt 154 lb 2 oz (69.9 kg)   SpO2 100%   BMI 24.14 kg/m   Physical Exam Vitals and nursing note reviewed.  Cardiovascular:     Rate and Rhythm: Normal rate and regular rhythm.     Heart sounds: Normal heart sounds. No murmur heard. Pulmonary:     Effort: Pulmonary effort is normal. No respiratory distress.     Breath sounds: Normal breath sounds. No wheezing.  Musculoskeletal:     Right lower leg: No edema.     Left lower leg: No edema.     Comments: Left foot mildly pronounced around the mid dorsal area compared to the right. Left foot mildly tender. No edema.      ASSESSMENT/PLAN:   Assessment & Plan Elevated liver enzymes Here for lab follow up Cmet ordered Iron deficiency anemia, unspecified iron deficiency anemia type Anemia panel ordered He is compliant with iron supplement Adjust regimen as needed Vitamin D  deficiency Vit D level checked Essential hypertension Poorly controlled on his current regimen Discussed adding hydrochlorothiazide to his regimen He prefers to defer till next visit He will work on lifestyle modification and continue Lisinopril  30 mg every day for now   Vaccine offered today and he declined all ( Flu, Covid,PCV) Shingrix at his pharmacy.  Otto Fairly, MD [facility]

## 2023-09-27 ENCOUNTER — Ambulatory Visit: Payer: Self-pay | Admitting: Family Medicine

## 2023-09-27 LAB — CMP14+EGFR
ALT: 8 IU/L (ref 0–44)
AST: 14 IU/L (ref 0–40)
Albumin: 3.8 g/dL — ABNORMAL LOW (ref 3.9–4.9)
Alkaline Phosphatase: 151 IU/L — ABNORMAL HIGH (ref 44–121)
BUN/Creatinine Ratio: 17 (ref 10–24)
BUN: 15 mg/dL (ref 8–27)
Bilirubin Total: <0.2 mg/dL (ref 0.0–1.2)
CO2: 21 mmol/L (ref 20–29)
Calcium: 9.3 mg/dL (ref 8.6–10.2)
Chloride: 104 mmol/L (ref 96–106)
Creatinine, Ser: 0.88 mg/dL (ref 0.76–1.27)
Globulin, Total: 3.2 g/dL (ref 1.5–4.5)
Glucose: 62 mg/dL — ABNORMAL LOW (ref 70–99)
Potassium: 4.5 mmol/L (ref 3.5–5.2)
Sodium: 138 mmol/L (ref 134–144)
Total Protein: 7 g/dL (ref 6.0–8.5)
eGFR: 97 mL/min/1.73 (ref >59)

## 2023-09-27 LAB — ANEMIA PROFILE B
Basophils Absolute: 0 x10E3/uL (ref 0.0–0.2)
Basos: 1 %
EOS (ABSOLUTE): 0.2 x10E3/uL (ref 0.0–0.4)
Eos: 3 %
Ferritin: 145 ng/mL (ref 30–400)
Folate: 10.9 ng/mL (ref >3.0)
Hematocrit: 41.8 % (ref 37.5–51.0)
Hemoglobin: 13.4 g/dL (ref 13.0–17.7)
Immature Grans (Abs): 0 x10E3/uL (ref 0.0–0.1)
Immature Granulocytes: 0 %
Iron Saturation: 24 % (ref 15–55)
Iron: 58 ug/dL (ref 38–169)
Lymphocytes Absolute: 1.3 x10E3/uL (ref 0.7–3.1)
Lymphs: 22 %
MCH: 27 pg (ref 26.6–33.0)
MCHC: 32.1 g/dL (ref 31.5–35.7)
MCV: 84 fL (ref 79–97)
Monocytes Absolute: 0.6 x10E3/uL (ref 0.1–0.9)
Monocytes: 10 %
Neutrophils Absolute: 3.8 x10E3/uL (ref 1.4–7.0)
Neutrophils: 64 %
Platelets: 216 x10E3/uL (ref 150–450)
RBC: 4.97 x10E6/uL (ref 4.14–5.80)
RDW: 15.2 % (ref 11.6–15.4)
Retic Ct Pct: 1 % (ref 0.6–2.6)
Total Iron Binding Capacity: 244 ug/dL — ABNORMAL LOW (ref 250–450)
UIBC: 186 ug/dL (ref 111–343)
Vitamin B-12: 364 pg/mL (ref 232–1245)
WBC: 5.9 x10E3/uL (ref 3.4–10.8)

## 2023-09-27 LAB — LIPID PANEL
Chol/HDL Ratio: 3.3 ratio (ref 0.0–5.0)
Cholesterol, Total: 164 mg/dL (ref 100–199)
HDL: 49 mg/dL (ref >39)
LDL Chol Calc (NIH): 101 mg/dL — ABNORMAL HIGH (ref 0–99)
Triglycerides: 70 mg/dL (ref 0–149)
VLDL Cholesterol Cal: 14 mg/dL (ref 5–40)

## 2023-09-27 LAB — VITAMIN D 25 HYDROXY (VIT D DEFICIENCY, FRACTURES): Vit D, 25-Hydroxy: 35.6 ng/mL (ref 30.0–100.0)

## 2023-10-09 ENCOUNTER — Other Ambulatory Visit: Payer: Self-pay | Admitting: Family Medicine

## 2023-10-11 ENCOUNTER — Other Ambulatory Visit: Payer: Self-pay | Admitting: Family Medicine

## 2023-10-24 ENCOUNTER — Encounter: Payer: Self-pay | Admitting: Family Medicine

## 2023-10-24 ENCOUNTER — Ambulatory Visit: Admitting: Family Medicine

## 2023-10-24 VITALS — BP 147/86 | HR 69 | Ht 67.0 in | Wt 159.0 lb

## 2023-10-24 DIAGNOSIS — R140 Abdominal distension (gaseous): Secondary | ICD-10-CM | POA: Diagnosis not present

## 2023-10-24 DIAGNOSIS — I6782 Cerebral ischemia: Secondary | ICD-10-CM

## 2023-10-24 DIAGNOSIS — R195 Other fecal abnormalities: Secondary | ICD-10-CM

## 2023-10-24 DIAGNOSIS — I10 Essential (primary) hypertension: Secondary | ICD-10-CM | POA: Diagnosis not present

## 2023-10-24 DIAGNOSIS — R748 Abnormal levels of other serum enzymes: Secondary | ICD-10-CM | POA: Insufficient documentation

## 2023-10-24 DIAGNOSIS — E785 Hyperlipidemia, unspecified: Secondary | ICD-10-CM | POA: Insufficient documentation

## 2023-10-24 LAB — HEMOCCULT GUIAC POC 1CARD (OFFICE): Fecal Occult Blood, POC: POSITIVE — AB

## 2023-10-24 MED ORDER — SIMETHICONE 125 MG PO CHEW: 125.0000 mg | CHEWABLE_TABLET | Freq: Two times a day (BID) | ORAL | 0 refills | Status: AC | PRN

## 2023-10-24 MED ORDER — ROSUVASTATIN CALCIUM 5 MG PO TABS: 5.0000 mg | ORAL_TABLET | Freq: Every day | ORAL | 1 refills | Status: AC

## 2023-10-24 MED ORDER — VITAMIN D (ERGOCALCIFEROL) 1.25 MG (50000 UNIT) PO CAPS: 50000.0000 [IU] | ORAL_CAPSULE | ORAL | 1 refills | Status: DC

## 2023-10-24 MED ORDER — LISINOPRIL 40 MG PO TABS: 40.0000 mg | ORAL_TABLET | Freq: Every day | ORAL | 1 refills | Status: AC

## 2023-10-24 NOTE — Addendum Note (Signed)
 Addended by: ANDERS CUMINS T on: 10/24/2023 11:02 AM   Modules accepted: Orders

## 2023-10-24 NOTE — Assessment & Plan Note (Addendum)
 BP is poorly controlled on his current regimen I discussed adding Norvasc 5 mg to his current regimen However, he prefers to be on only one agent for now Increased Lisinopril  to 40 mg QD Advised to d/c Lisinopril  30 mg every day F/U in 1 week for renal function and K+ monitoring He read back instruction and agreed with the plan

## 2023-10-24 NOTE — Assessment & Plan Note (Addendum)
 Seen on Ct scan LDL goal < 70 FLP result reviewed with him with an LDL of 101 He agrees to get back on his Crestor  5 mg every day Refill sent in

## 2023-10-24 NOTE — Assessment & Plan Note (Addendum)
 Likely bone source due to osteonecrosis RUQ US  in 2020 was normal Benign abdominal pain Neg acute hepatitis viral panel in 2024 Plan to add GGT to future lab and monitor Alk phos Consider repeat RUQ US 

## 2023-10-24 NOTE — Patient Instructions (Signed)
 Rosuvastatin  Tablets What is this medication? ROSUVASTATIN  (roe SOO va sta tin) treats high cholesterol and reduces the risk of heart attack and stroke. It works by decreasing bad cholesterol and fats (such as LDL, triglycerides) and increasing good cholesterol (HDL) in your blood. It belongs to a group of medications called statins. Changes to diet and exercise are often combined with this medication. This medicine may be used for other purposes; ask your health care provider or pharmacist if you have questions. COMMON BRAND NAME(S): Crestor  What should I tell my care team before I take this medication? They need to know if you have any of these conditions: Diabetes Frequently drink alcohol Kidney disease Liver disease Muscle pain or cramps Thyroid  disease An unusual or allergic reaction to rosuvastatin , other medications, foods, dyes, or preservatives Pregnant or trying to get pregnant Breastfeeding How should I use this medication? Take this medication by mouth with water. Take it as directed on the prescription label at the same time every day. You can take it with or without food. If it upsets your stomach, take it with food. Keep taking it unless your care team tells you to stop. Take antacids with aluminum and magnesium hydroxide in them at a different time of day than this medication. Take antacids 2 hours after this medication. Talk to your care team about the use of this medication in children. While it may be prescribed for children as young as 7 years for selected conditions, precautions do apply. Overdosage: If you think you have taken too much of this medicine contact a poison control center or emergency room at once. NOTE: This medicine is only for you. Do not share this medicine with others. What if I miss a dose? If you miss a dose, skip it. Take your next dose at the normal time. Do not take extra or 2 doses at the same time to make up for the missed dose. What may interact  with this medication? Do not take this medication with any of the following: Red yeast rice This medication may also interact with the following: Alcohol Antacids containing aluminum and magnesium hydroxide Colchicine Cyclosporine Febuxostat Fostamatinib Some medications for cancer, such as capmatinib, darolutamide, enasidenib, regorafenib Some medications for HIV or hepatitis Other medications for cholesterol Tafamidis Teriflunomide Warfarin This medication may affect how other medications work, and other medications may affect the way this medication works. Talk with your care team about all the medications you take. They may suggest changes to your treatment plan to lower the risk of side effects and to make sure your medications work as intended. This list may not describe all possible interactions. Give your health care provider a list of all the medicines, herbs, non-prescription drugs, or dietary supplements you use. Also tell them if you smoke, drink alcohol, or use illegal drugs. Some items may interact with your medicine. What should I watch for while using this medication? Visit your care team for regular checks on your progress. You may need blood work done while you are taking this medication. Taking this medication is only part of a total heart healthy program. Ask your care team if there are other changes you can make to improve your overall health. Your care team may tell you to stop taking this medication if you develop muscle problems. If your muscle problems do not go away after stopping this medication, contact your care team. If you are going to need surgery or a procedure, tell your care team that you are  taking this medication. This medication may increase blood sugar. The risk may be higher in patients who already have diabetes. Ask your care team what you can do to lower your risk of diabetes while taking this medication. Talk to your care team if you may be  pregnant. Serious birth defects can occur if you take this medication during pregnancy. There are benefits and risks to taking medications during pregnancy. Your care team can help you find the option that works for you. Talk to your care team before breastfeeding. Changes to your treatment plan may be needed. What side effects may I notice from receiving this medication? Side effects that you should report to your care team as soon as possible: Allergic reactions--skin rash, itching, hives, swelling of the face, lips, tongue, or throat High blood sugar (hyperglycemia)--increased thirst or amount of urine, unusual weakness, fatigue, blurry vision Liver injury--right upper belly pain, loss of appetite, nausea, light-colored stool, dark yellow or brown urine, yellowing skin or eyes, unusual weakness, fatigue Muscle injury--unusual weakness, fatigue, muscle pain, dark yellow or brown urine, decrease in amount of urine Redness, blistering, peeling or loosening of the skin, including inside the mouth Side effects that usually do not require medical attention (report to your care team if they continue or are bothersome): Fatigue Headache Nausea Stomach pain This list may not describe all possible side effects. Call your doctor for medical advice about side effects. You may report side effects to FDA at 1-800-FDA-1088. Where should I keep my medication? Keep out of the reach of children and pets. Store at room temperature between 20 and 25 degrees C (68 and 77 degrees F). Protect from moisture. Keep the container tightly closed. Get rid of any unused medication after the expiration date. To get rid of medications that are no longer needed or have expired: Take the medication to a take-back program. Check with your pharmacy or law enforcement to find a location. If you cannot return the medication, check the label or package insert to see if the medication should be thrown out in the garbage or flushed  down the toilet. If you are not sure, ask your care team. If it is safe to put it in the trash, empty the medication out of the container. Mix it with cat litter, dirt, coffee grounds, or another unwanted substance. Seal the mixture in a bag or container. Put it in the trash. NOTE: This sheet is a summary. It may not cover all possible information. If you have questions about this medicine, talk to your doctor, pharmacist, or health care provider.  2025 Elsevier/Gold Standard (2023-03-11 00:00:00)

## 2023-10-24 NOTE — Progress Notes (Addendum)
 SUBJECTIVE:   CHIEF COMPLAINT / HPI:   HTN/HLD: He is compliant with Lisinopril  30 mg every day for his BP, with his last dose last night. He checks his BP at home sometimes, with his BP level running high a bit during the day. In the evening, BP improved to 150/90s before he used his meds. When he takes his Meds, his BP improves even more. Not on meds for his HLD.  Latent TB: He denies any symptoms. Completing treatment for his latent TB with the health department by the end of October.  Bloating: Belly discomfort, gassing, and bloating are attributed to milk. Whole milk organic.  He has had issues with bloating for many years, but now it has become more prominent, causing him concern. No blood in his stool, but the stool is darker. He is constipated. Last BM was last night. He moves his bowel daily. Denies belly pain, but feels uncomfortable for the most part. Endorsed feeling of food indigestion.   Elevated Liver Enzymes: ALK Phos remains elevated. No other GI symptoms other than the above.  PERTINENT  PMH / PSH: PMHx reviewed  OBJECTIVE:   BP (!) 147/86   Pulse 69   Ht 5' 7 (1.702 m)   Wt 159 lb (72.1 kg)   SpO2 100%   BMI 24.90 kg/m   Physical Exam Vitals and nursing note reviewed.  Cardiovascular:     Rate and Rhythm: Normal rate and regular rhythm.     Heart sounds: Normal heart sounds. No murmur heard. Pulmonary:     Effort: Pulmonary effort is normal. No respiratory distress.     Breath sounds: Normal breath sounds. No wheezing.  Abdominal:     General: Abdomen is flat. Bowel sounds are normal. There is no distension.     Palpations: Abdomen is soft. There is no mass.     Tenderness: There is no abdominal tenderness. There is no guarding.     Comments: Benign      ASSESSMENT/PLAN:   Assessment & Plan Abdominal bloating ?? Related to diet - whole milk He is now avoiding whole milk for symptom improvement He is up today with his colon cancer  screening Has hx of + H pylori, which was treated in the past Repeat the test today FOBT recommended, and he opted for self-collection + FOBT during this visit Result discussed with him, and I recommended a GI referral He agreed with the plan. Gas X PRN recommended Based on his hx, he does not seem to meet the criteria for constipation, but may use Miralax prn Consider referral back to GI if symptoms persist or worsen He agreed with the plan  Dark stools FOBT recommended, and he opted for self-collection + FOBT during this visit Result discussed with him and I recommended GI referral He agreed with the plan. Essential hypertension, benign BP is poorly controlled on his current regimen I discussed adding Norvasc 5 mg to his current regimen However, he prefers to be on only one agent for now Increased Lisinopril  to 40 mg QD Advised to d/c Lisinopril  30 mg every day F/U in 1 week for renal function and K+ monitoring He read back instruction and agreed with the plan Chronic cerebral ischemia Seen on Ct scan LDL goal < 70 FLP result reviewed with him with an LDL of 101 He agrees to get back on his Crestor  5 mg every day Refill sent in  Hyperlipidemia, unspecified hyperlipidemia type Seen on Ct scan LDL goal <  70 FLP result reviewed with him with an LDL of 101 He agrees to get back on his Crestor  5 mg every day Refill sent in Elevated alkaline phosphatase level Likely bone source due to osteonecrosis RUQ US  in 2020 was normal Benign abdominal pain Neg acute hepatitis viral panel in 2024 Plan to add GGT to future lab and monitor Alk phos Consider repeat RUQ US      Otto Fairly, MD [facility]

## 2023-10-25 ENCOUNTER — Telehealth: Payer: Self-pay

## 2023-10-25 ENCOUNTER — Other Ambulatory Visit: Payer: Self-pay | Admitting: Family Medicine

## 2023-10-25 DIAGNOSIS — R195 Other fecal abnormalities: Secondary | ICD-10-CM

## 2023-10-25 NOTE — Telephone Encounter (Signed)
 Patient calls nurse line in regards to recent +FOBT.  He reports he was doing some research last night and is requesting a retest. He reports he takes iron supplements daily and reports a diet that is high in animal liver. He reports he most recently had this meal on Sunday 10/5.  He reports he is still interested in GI referral, however would like to discuss a repeat stool test.   Advised will forward to PCP.

## 2023-10-26 ENCOUNTER — Other Ambulatory Visit: Payer: Self-pay | Admitting: Family Medicine

## 2023-10-26 ENCOUNTER — Ambulatory Visit: Payer: Self-pay | Admitting: Family Medicine

## 2023-10-26 DIAGNOSIS — I10 Essential (primary) hypertension: Secondary | ICD-10-CM

## 2023-10-26 LAB — H. PYLORI BREATH TEST: H pylori Breath Test: NEGATIVE

## 2023-10-31 NOTE — Telephone Encounter (Signed)
 Patient has been made aware.

## 2023-11-01 ENCOUNTER — Other Ambulatory Visit (INDEPENDENT_AMBULATORY_CARE_PROVIDER_SITE_OTHER): Payer: Self-pay

## 2023-11-01 ENCOUNTER — Ambulatory Visit: Payer: Self-pay | Admitting: Family Medicine

## 2023-11-01 DIAGNOSIS — R140 Abdominal distension (gaseous): Secondary | ICD-10-CM

## 2023-11-01 DIAGNOSIS — R195 Other fecal abnormalities: Secondary | ICD-10-CM | POA: Diagnosis not present

## 2023-11-01 DIAGNOSIS — R748 Abnormal levels of other serum enzymes: Secondary | ICD-10-CM

## 2023-11-01 LAB — HEMOCCULT GUIAC POC 1CARD (OFFICE): Fecal Occult Blood, POC: NEGATIVE

## 2023-11-02 LAB — CMP14+EGFR
ALT: 9 IU/L (ref 0–44)
AST: 13 IU/L (ref 0–40)
Albumin: 3.7 g/dL — ABNORMAL LOW (ref 3.9–4.9)
Alkaline Phosphatase: 137 IU/L — ABNORMAL HIGH (ref 47–123)
BUN/Creatinine Ratio: 17 (ref 10–24)
BUN: 15 mg/dL (ref 8–27)
Bilirubin Total: 0.2 mg/dL (ref 0.0–1.2)
CO2: 19 mmol/L — ABNORMAL LOW (ref 20–29)
Calcium: 9 mg/dL (ref 8.6–10.2)
Chloride: 107 mmol/L — ABNORMAL HIGH (ref 96–106)
Creatinine, Ser: 0.88 mg/dL (ref 0.76–1.27)
Globulin, Total: 2.9 g/dL (ref 1.5–4.5)
Glucose: 94 mg/dL (ref 70–99)
Potassium: 4.8 mmol/L (ref 3.5–5.2)
Sodium: 140 mmol/L (ref 134–144)
Total Protein: 6.6 g/dL (ref 6.0–8.5)
eGFR: 97 mL/min/1.73 (ref >59)

## 2023-11-02 LAB — GAMMA GT: GGT: 18 IU/L (ref 0–65)

## 2023-11-15 ENCOUNTER — Other Ambulatory Visit: Payer: Self-pay | Admitting: Family Medicine

## 2023-11-19 ENCOUNTER — Other Ambulatory Visit: Payer: Self-pay | Admitting: Family Medicine

## 2023-11-21 ENCOUNTER — Encounter: Payer: Self-pay | Admitting: Radiology

## 2023-11-23 ENCOUNTER — Ambulatory Visit: Admitting: "Endocrinology

## 2023-12-19 ENCOUNTER — Ambulatory Visit

## 2023-12-23 ENCOUNTER — Encounter: Payer: Self-pay | Admitting: Family Medicine

## 2023-12-23 ENCOUNTER — Ambulatory Visit: Admitting: Family Medicine

## 2023-12-23 VITALS — BP 125/83 | HR 74 | Temp 97.3°F | Ht 67.0 in | Wt 160.0 lb

## 2023-12-23 DIAGNOSIS — I10 Essential (primary) hypertension: Secondary | ICD-10-CM

## 2023-12-23 DIAGNOSIS — N529 Male erectile dysfunction, unspecified: Secondary | ICD-10-CM | POA: Diagnosis not present

## 2023-12-23 DIAGNOSIS — L299 Pruritus, unspecified: Secondary | ICD-10-CM

## 2023-12-23 MED ORDER — DESONIDE 0.05 % EX CREA: TOPICAL_CREAM | Freq: Two times a day (BID) | CUTANEOUS | 0 refills | Status: AC

## 2023-12-23 MED ORDER — FERROUS FUMARATE 324 (106 FE) MG PO TABS: 1.0000 | ORAL_TABLET | ORAL | 2 refills | Status: AC

## 2023-12-23 MED ORDER — LEVOCETIRIZINE DIHYDROCHLORIDE 5 MG PO TABS: 5.0000 mg | ORAL_TABLET | Freq: Every evening | ORAL | 1 refills | Status: AC

## 2023-12-23 MED ORDER — VITAMIN D3 50 MCG (2000 UT) PO TABS: 1.0000 | ORAL_TABLET | ORAL | 6 refills | Status: AC

## 2023-12-23 NOTE — Assessment & Plan Note (Addendum)
 BP well controlled on his current regimen Continue current regimen and monitor BP closely at home

## 2023-12-23 NOTE — Progress Notes (Signed)
    SUBJECTIVE:   CHIEF COMPLAINT / HPI:   HTN/Erectile Dysfunction: Takes Lisinopril  40 mg at bedtime. Last dose was last night. This is affecting his Libido. Home BP runs in the 145/80s  Skin Pruritus: He recently completed his TB medication prescribed by the health department. At initiation, he developed skin rash and itchiness. Now that he is off the medications x 1 month, he is still having itchiness flare, but no rash. Itchiness is triggered or worse with eating any food, but improves with shower. No fever, no sick contact. He had similar episode many years ago which resolved with a medication which he can't remember the name.   PERTINENT  PMH / PSH: PMHx reviewed  OBJECTIVE:   BP (!) 151/92   Pulse 75   Temp (!) 97.3 F (36.3 C)   Ht 5' 7 (1.702 m)   Wt 160 lb (72.6 kg)   SpO2 100%   BMI 25.06 kg/m   Physical Exam Vitals and nursing note reviewed.  Constitutional:      Appearance: He is not ill-appearing or diaphoretic.  Cardiovascular:     Rate and Rhythm: Normal rate and regular rhythm.     Heart sounds: Normal heart sounds. No murmur heard. Pulmonary:     Effort: Pulmonary effort is normal. No respiratory distress.     Breath sounds: Normal breath sounds. No stridor. No wheezing.  Skin:    Comments: Dry, skin, no obvious skin lesions on his extremities  Neurological:     Mental Status: He is alert.      ASSESSMENT/PLAN:   Assessment & Plan Essential hypertension, benign BP well controlled on his current regimen Continue current regimen and monitor BP closely at home  Erectile dysfunction, unspecified erectile dysfunction type Hx of but worsened Per the patient, he feels this is due to his Lisinopril  dose increase As discussed with him, this is not a known cause of ED Plan to return for morning testosterone  and hormonal lab - appointment made Monitor closely for now  Pruritus No definite skin rash ?? Allergic reaction Given recent completion of INH  for latent TB, will recheck Liver enzyme and bile acid He returns to lab next Tuesday for this Xyzal  and Desonide  escribed Monitor for improvement     Otto Fairly, MD [facility]

## 2023-12-23 NOTE — Patient Instructions (Signed)
 Pruritus Pruritus is an itchy feeling on the skin. One of the most common causes is dry skin, but many different things can cause itching. Most cases of itching do not require medical attention. Sometimes itchy skin can turn into a rash or a secondary infection. Follow these instructions at home: Skin care  Do not use scented soaps, detergents, perfumes, and cosmetic products. Instead, use gentle, unscented versions of these items. Apply moisturizing creams to your skin frequently, at least twice daily. Apply immediately after bathing while skin is still wet. Take medicines or apply medicated creams only as told by your health care provider. This may include: Corticosteroid cream or topical calcineurin inhibitor. Anti-itch lotions containing urea, camphor, or menthol. Oral antihistamines. Do not take hot showers or baths, which can make itching worse. A short, cool shower may help with itching as long as you apply moisturizing lotion after the shower. Apply a cool, wet cloth (cool compress) to the affected areas. You may take lukewarm baths with one of the following: Epsom salts. You can get these at your local pharmacy or grocery store. Follow the instructions on the packaging. Baking soda. Pour a small amount into the bath as told by your health care provider. Colloidal oatmeal. You can get this at your local pharmacy or grocery store. Follow the instructions on the packaging. Do not scratch your skin. General instructions Avoid wearing tight clothes. Keep a journal to help find out what is causing your itching. Write down: What you eat and drink. What cosmetic products you use. What soaps or detergents you use. What you wear, including jewelry. Use a humidifier. This keeps the air moist, which helps to prevent dry skin. Be aware of any changes in your itchiness. Tell your health care provider about any changes. Contact a health care provider if: The itching does not go away after  several days. You notice redness, warmth, or drainage on the skin where you have scratched. You are unusually thirsty or urinating more than normal. Your skin tingles or feels numb. Your skin or the white parts of your eyes turn yellow (jaundice). You feel weak. You have any of the following: Night sweats. Tiredness (fatigue). Weight loss. Abdominal pain. Summary Pruritus is an itchy feeling on the skin. One of the most common causes is dry skin, but many different conditions and factors can cause itching. Apply moisturizing creams to your skin frequently, at least twice daily. Apply immediately after bathing while skin is still wet. Take medicines or apply medicated creams only as told by your health care provider. Do not take hot showers or baths. Do not use scented soaps, detergents, perfumes, or cosmetic products. Keep a journal to help find out what is causing your itching. This information is not intended to replace advice given to you by your health care provider. Make sure you discuss any questions you have with your health care provider. Document Revised: 02/11/2021 Document Reviewed: 02/11/2021 Elsevier Patient Education  2024 [employer].

## 2023-12-23 NOTE — Assessment & Plan Note (Addendum)
 Hx of but worsened Per the patient, he feels this is due to his Lisinopril  dose increase As discussed with him, this is not a known cause of ED Plan to return for morning testosterone  and hormonal lab - appointment made Monitor closely for now

## 2023-12-27 ENCOUNTER — Other Ambulatory Visit: Payer: Self-pay

## 2024-01-03 ENCOUNTER — Other Ambulatory Visit

## 2024-01-03 DIAGNOSIS — L299 Pruritus, unspecified: Secondary | ICD-10-CM

## 2024-01-03 DIAGNOSIS — I10 Essential (primary) hypertension: Secondary | ICD-10-CM

## 2024-01-03 DIAGNOSIS — N529 Male erectile dysfunction, unspecified: Secondary | ICD-10-CM

## 2024-01-04 ENCOUNTER — Ambulatory Visit: Payer: Self-pay | Admitting: Family Medicine

## 2024-01-05 LAB — CMP14+EGFR
ALT: 8 IU/L (ref 0–44)
AST: 11 IU/L (ref 0–40)
Albumin: 3.7 g/dL — ABNORMAL LOW (ref 3.9–4.9)
Alkaline Phosphatase: 131 IU/L — ABNORMAL HIGH (ref 47–123)
BUN/Creatinine Ratio: 17 (ref 10–24)
BUN: 15 mg/dL (ref 8–27)
Bilirubin Total: 0.4 mg/dL (ref 0.0–1.2)
CO2: 21 mmol/L (ref 20–29)
Calcium: 9.3 mg/dL (ref 8.6–10.2)
Chloride: 104 mmol/L (ref 96–106)
Creatinine, Ser: 0.87 mg/dL (ref 0.76–1.27)
Globulin, Total: 2.6 g/dL (ref 1.5–4.5)
Glucose: 78 mg/dL (ref 70–99)
Potassium: 4.4 mmol/L (ref 3.5–5.2)
Sodium: 139 mmol/L (ref 134–144)
Total Protein: 6.3 g/dL (ref 6.0–8.5)
eGFR: 97 mL/min/1.73 (ref >59)

## 2024-01-05 LAB — TESTOSTERONE,FREE AND TOTAL
Testosterone, Free: 9.3 pg/mL (ref 6.6–18.1)
Testosterone: 788 ng/dL (ref 264–916)

## 2024-01-05 LAB — BILE ACIDS, TOTAL: Bile Acids Total: 6.9 umol/L (ref 0.0–10.0)

## 2024-01-05 LAB — TSH RFX ON ABNORMAL TO FREE T4: TSH: 4.98 u[IU]/mL — ABNORMAL HIGH (ref 0.450–4.500)

## 2024-01-05 LAB — T4F: T4,Free (Direct): 1.14 ng/dL (ref 0.82–1.77)

## 2024-01-05 LAB — HEMOGLOBIN A1C
Est. average glucose Bld gHb Est-mCnc: 117 mg/dL
Hgb A1c MFr Bld: 5.7 % — ABNORMAL HIGH (ref 4.8–5.6)

## 2024-01-05 LAB — PROLACTIN: Prolactin: 11.4 ng/mL (ref 3.6–25.2)

## 2024-02-15 ENCOUNTER — Encounter: Payer: Self-pay | Admitting: "Endocrinology

## 2024-02-15 ENCOUNTER — Ambulatory Visit (INDEPENDENT_AMBULATORY_CARE_PROVIDER_SITE_OTHER): Admitting: "Endocrinology

## 2024-02-15 VITALS — BP 132/80 | HR 70 | Resp 16 | Ht 67.0 in | Wt 158.6 lb

## 2024-02-15 DIAGNOSIS — E038 Other specified hypothyroidism: Secondary | ICD-10-CM

## 2024-02-15 DIAGNOSIS — Z8639 Personal history of other endocrine, nutritional and metabolic disease: Secondary | ICD-10-CM | POA: Diagnosis not present
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
## 2024-02-16 LAB — THYROID PEROXIDASE ANTIBODIES (TPO) (REFL): Thyroperoxidase Ab SerPl-aCnc: 1 [IU]/mL

## 2024-02-17 LAB — T4, FREE: Free T4: 1 ng/dL (ref 0.8–1.8)

## 2024-02-17 LAB — THYROID STIMULATING IMMUNOGLOBULIN: TSI: <89 %{baseline}

## 2024-02-17 LAB — TRAB (TSH RECEPTOR BINDING ANTIBODY): TRAB: <1 [IU]/L

## 2024-02-17 LAB — TSH: TSH: 3.78 m[IU]/L (ref 0.40–4.50)

## 2024-02-20 ENCOUNTER — Ambulatory Visit: Payer: Self-pay | Admitting: "Endocrinology

## 2024-02-28 ENCOUNTER — Ambulatory Visit: Admitting: Family Medicine
